# Patient Record
Sex: Female | Born: 1965 | Race: White | Hispanic: No | Smoking: Never smoker
Health system: Southern US, Community
[De-identification: ages and names within clinical notes are randomized; demographics above are authoritative.]

## PROBLEM LIST (undated history)

## (undated) DIAGNOSIS — E538 Deficiency of other specified B group vitamins: Secondary | ICD-10-CM

## (undated) DIAGNOSIS — E119 Type 2 diabetes mellitus without complications: Secondary | ICD-10-CM

## (undated) DIAGNOSIS — T7840XA Allergy, unspecified, initial encounter: Secondary | ICD-10-CM

## (undated) DIAGNOSIS — I10 Essential (primary) hypertension: Secondary | ICD-10-CM

## (undated) DIAGNOSIS — G4730 Sleep apnea, unspecified: Secondary | ICD-10-CM

## (undated) DIAGNOSIS — D649 Anemia, unspecified: Secondary | ICD-10-CM

## (undated) DIAGNOSIS — F4310 Post-traumatic stress disorder, unspecified: Secondary | ICD-10-CM

## (undated) DIAGNOSIS — E559 Vitamin D deficiency, unspecified: Secondary | ICD-10-CM

## (undated) DIAGNOSIS — K219 Gastro-esophageal reflux disease without esophagitis: Secondary | ICD-10-CM

## (undated) DIAGNOSIS — M1990 Unspecified osteoarthritis, unspecified site: Secondary | ICD-10-CM

## (undated) DIAGNOSIS — Z91018 Allergy to other foods: Secondary | ICD-10-CM

## (undated) DIAGNOSIS — K829 Disease of gallbladder, unspecified: Secondary | ICD-10-CM

## (undated) DIAGNOSIS — T8859XA Other complications of anesthesia, initial encounter: Secondary | ICD-10-CM

## (undated) DIAGNOSIS — K900 Celiac disease: Secondary | ICD-10-CM

## (undated) DIAGNOSIS — F419 Anxiety disorder, unspecified: Secondary | ICD-10-CM

## (undated) DIAGNOSIS — K5900 Constipation, unspecified: Secondary | ICD-10-CM

## (undated) DIAGNOSIS — R5383 Other fatigue: Secondary | ICD-10-CM

## (undated) DIAGNOSIS — F32A Depression, unspecified: Secondary | ICD-10-CM

## (undated) DIAGNOSIS — M170 Bilateral primary osteoarthritis of knee: Secondary | ICD-10-CM

## (undated) DIAGNOSIS — F329 Major depressive disorder, single episode, unspecified: Secondary | ICD-10-CM

## (undated) HISTORY — DX: Sleep apnea, unspecified: G47.30

## (undated) HISTORY — DX: Constipation, unspecified: K59.00

## (undated) HISTORY — DX: Unspecified osteoarthritis, unspecified site: M19.90

## (undated) HISTORY — DX: Allergy to other foods: Z91.018

## (undated) HISTORY — DX: Anemia, unspecified: D64.9

## (undated) HISTORY — DX: Gastro-esophageal reflux disease without esophagitis: K21.9

## (undated) HISTORY — DX: Post-traumatic stress disorder, unspecified: F43.10

## (undated) HISTORY — DX: Allergy, unspecified, initial encounter: T78.40XA

## (undated) HISTORY — DX: Vitamin D deficiency, unspecified: E55.9

## (undated) HISTORY — DX: Anxiety disorder, unspecified: F41.9

## (undated) HISTORY — DX: Deficiency of other specified B group vitamins: E53.8

## (undated) HISTORY — DX: Morbid (severe) obesity due to excess calories: E66.01

## (undated) HISTORY — DX: Disease of gallbladder, unspecified: K82.9

## (undated) HISTORY — DX: Celiac disease: K90.0

## (undated) HISTORY — PX: SINUS IRRIGATION: SHX2411

## (undated) HISTORY — DX: Depression, unspecified: F32.A

## (undated) HISTORY — DX: Type 2 diabetes mellitus without complications: E11.9

## (undated) HISTORY — DX: Essential (primary) hypertension: I10

## (undated) HISTORY — DX: Major depressive disorder, single episode, unspecified: F32.9

## (undated) HISTORY — DX: Bilateral primary osteoarthritis of knee: M17.0

## (undated) HISTORY — DX: Other fatigue: R53.83

---

## 2013-10-25 HISTORY — PX: CHOLECYSTECTOMY: SHX55

## 2015-04-23 LAB — HM PAP SMEAR: HM Pap smear: NEGATIVE

## 2017-05-08 LAB — TSH: TSH: 2.31 (ref <=5.90)

## 2017-05-08 LAB — BASIC METABOLIC PANEL
BUN: 16 (ref 4–21)
Creatinine: 0.8 (ref <=1.1)
Glucose: 100
Potassium: 4.1 (ref 3.4–5.3)
Sodium: 138 (ref 137–147)

## 2017-05-08 LAB — CBC AND DIFFERENTIAL
HEMATOCRIT: 42 (ref 36–46)
Hemoglobin: 13.5 (ref 12.0–16.0)
WBC: 7.4

## 2017-05-08 LAB — HEPATIC FUNCTION PANEL
ALT: 14 (ref 7–35)
AST: 14 (ref 13–35)

## 2018-01-26 ENCOUNTER — Encounter: Payer: Self-pay | Admitting: Family Medicine

## 2018-01-26 ENCOUNTER — Other Ambulatory Visit: Payer: Self-pay

## 2018-01-26 ENCOUNTER — Ambulatory Visit (INDEPENDENT_AMBULATORY_CARE_PROVIDER_SITE_OTHER): Payer: 59 | Admitting: Family Medicine

## 2018-01-26 DIAGNOSIS — F329 Major depressive disorder, single episode, unspecified: Secondary | ICD-10-CM | POA: Diagnosis not present

## 2018-01-26 DIAGNOSIS — E559 Vitamin D deficiency, unspecified: Secondary | ICD-10-CM

## 2018-01-26 DIAGNOSIS — F4310 Post-traumatic stress disorder, unspecified: Secondary | ICD-10-CM

## 2018-01-26 DIAGNOSIS — F32A Depression, unspecified: Secondary | ICD-10-CM

## 2018-01-26 DIAGNOSIS — I10 Essential (primary) hypertension: Secondary | ICD-10-CM | POA: Diagnosis not present

## 2018-01-26 DIAGNOSIS — M170 Bilateral primary osteoarthritis of knee: Secondary | ICD-10-CM

## 2018-01-26 DIAGNOSIS — E538 Deficiency of other specified B group vitamins: Secondary | ICD-10-CM | POA: Diagnosis not present

## 2018-01-26 DIAGNOSIS — K900 Celiac disease: Secondary | ICD-10-CM | POA: Insufficient documentation

## 2018-01-26 HISTORY — DX: Celiac disease: K90.0

## 2018-01-26 HISTORY — DX: Depression, unspecified: F32.A

## 2018-01-26 HISTORY — DX: Bilateral primary osteoarthritis of knee: M17.0

## 2018-01-26 HISTORY — DX: Morbid (severe) obesity due to excess calories: E66.01

## 2018-01-26 LAB — CBC WITH DIFFERENTIAL/PLATELET
BASOS ABS: 0.1 10*3/uL (ref 0.0–0.1)
BASOS PCT: 1.2 % (ref 0.0–3.0)
Eosinophils Absolute: 0.4 10*3/uL (ref 0.0–0.7)
Eosinophils Relative: 3.8 % (ref 0.0–5.0)
HEMATOCRIT: 38.7 % (ref 36.0–46.0)
Hemoglobin: 13.1 g/dL (ref 12.0–15.0)
LYMPHS ABS: 2.3 10*3/uL (ref 0.7–4.0)
Lymphocytes Relative: 25.2 % (ref 12.0–46.0)
MCHC: 33.9 g/dL (ref 30.0–36.0)
MCV: 83.4 fl (ref 78.0–100.0)
MONOS PCT: 9.2 % (ref 3.0–12.0)
Monocytes Absolute: 0.9 10*3/uL (ref 0.1–1.0)
NEUTROS ABS: 5.6 10*3/uL (ref 1.4–7.7)
NEUTROS PCT: 60.6 % (ref 43.0–77.0)
PLATELETS: 470 10*3/uL — AB (ref 150.0–400.0)
RBC: 4.65 Mil/uL (ref 3.87–5.11)
RDW: 13.9 % (ref 11.5–15.5)
WBC: 9.3 10*3/uL (ref 4.0–10.5)

## 2018-01-26 LAB — COMPREHENSIVE METABOLIC PANEL
ALT: 28 U/L (ref 0–35)
AST: 23 U/L (ref 0–37)
Albumin: 3.9 g/dL (ref 3.5–5.2)
Alkaline Phosphatase: 106 U/L (ref 39–117)
BUN: 14 mg/dL (ref 6–23)
CHLORIDE: 102 meq/L (ref 96–112)
CO2: 28 mEq/L (ref 19–32)
CREATININE: 0.6 mg/dL (ref 0.40–1.20)
Calcium: 9.1 mg/dL (ref 8.4–10.5)
GFR: 111.03 mL/min (ref >60.00)
GLUCOSE: 93 mg/dL (ref 70–99)
Potassium: 4.3 mEq/L (ref 3.5–5.1)
Sodium: 137 mEq/L (ref 135–145)
TOTAL PROTEIN: 6.8 g/dL (ref 6.0–8.3)
Total Bilirubin: 0.6 mg/dL (ref 0.2–1.2)

## 2018-01-26 LAB — TSH: TSH: 2.23 u[IU]/mL (ref 0.35–4.50)

## 2018-01-26 LAB — B12 AND FOLATE PANEL: Folate: 15.8 ng/mL (ref >5.9)

## 2018-01-26 LAB — VITAMIN D 25 HYDROXY (VIT D DEFICIENCY, FRACTURES): VITD: 32.54 ng/mL (ref 30.00–100.00)

## 2018-01-26 NOTE — Patient Instructions (Addendum)
Please return in 6 weeks for recheck and result review. Please bring in log of your blood pressures outside of the office.   Work on a low salt diet.   It was a pleasure meeting you today! Thank you for choosing [place] to meet your healthcare needs! I truly look forward to working with you. If you have any questions or concerns, please send me a message via Mychart or call the office at [phone].

## 2018-01-26 NOTE — Progress Notes (Signed)
Subjective  CC:  Chief Complaint  Patient presents with  . Establish Care    Moved here from [place], [place]     HPI: Kelly Perez is a 52 y.o. female who presents to [place] [facility] today to establish care with me as a new patient.   She has the following concerns or needs:  Celiac disease - mildly active; very sensitive. Eats a strict diet. Worried about vitamin deficiencies; was on vit B12 injections, Vit D and iron. dxd by bx in 2011.   Obesity: had lost about 80 pounds in the last few years; now back up about 20. Ready to work on it again. Had used phentermine.   H/o HTN but off meds x 1-2 years since losing weight. Not routinely checking blood pressures but had been normal. Ros + occ headache. No cp or sob or le edema. No h/o CAD, DM or hyperlipidemia  PTSD / depression fairly stable on zoloft 50 for several years. [place]; liking it but admits to some anxiety sxs.  Depression screen PHQ 2/9 01/26/2018  Decreased Interest 1  Down, Depressed, Hopeless 1  PHQ - 2 Score 2  Altered sleeping 1  Tired, decreased energy 1  Change in appetite 1  Feeling bad or failure about yourself  1  Trouble concentrating 1  Moving slowly or fidgety/restless 0  Suicidal thoughts 0  PHQ-9 Score 7  Difficult doing work/chores Somewhat difficult   GAD 7 : Generalized Anxiety Score 01/26/2018  Nervous, Anxious, on Edge 1  Control/stop worrying 1  Worry too much - different things 1  Trouble relaxing 1  Restless 1  Easily annoyed or irritable 2  Afraid - awful might happen 1  Total GAD 7 Score 8  Anxiety Difficulty Somewhat difficult    HM: last CPE with pap July 2018 in [place].   We updated and reviewed the patient's past history in detail and it is documented below.  Patient Active Problem List   Diagnosis Date Noted  . Morbid obesity ([place]) 01/26/2018    Priority: High  . Celiac disease 01/26/2018    Priority: Medium  .  Essential hypertension 01/26/2018  . Chronic depression 01/26/2018  . Bilateral primary osteoarthritis of knee 01/26/2018  . Vitamin B12 deficiency   . Vitamin D deficiency   . PTSD (post-traumatic stress disorder)    Health Maintenance  Topic Date Due  . Hepatitis C Screening  Mar 23, 1966  . HIV Screening  10/15/1979  . TETANUS/TDAP  10/15/1983  . MAMMOGRAM  03/25/2018  . INFLUENZA VACCINE  05/25/2018  . COLONOSCOPY  10/26/2019  . PAP SMEAR  03/25/2020    There is no immunization history on file for this patient. Current Meds  Medication Sig  . Ferrous Gluconate (IRON 27 PO) Take by mouth.  . Nutritional Supplements (VITAMIN D MAINTENANCE PO) Take 5,000 Units by mouth.  . NUTRITIONAL SUPPLEMENTS PO Take by mouth.  . sertraline (ZOLOFT) 50 MG tablet Take by mouth.  . vitamin B-12 (CYANOCOBALAMIN) 100 MCG tablet Take 100 mcg by mouth daily.    Allergies: Patient is allergic to neosporin [neomycin-bacitracin zn-polymyx]; tetracycline; and other. Past Medical History Patient  has a past medical history of Allergy, Anxiety, Arthritis, Bilateral primary osteoarthritis of knee (01/26/2018), Celiac disease (01/26/2018), Depression, GERD (gastroesophageal reflux disease), Hypertension, Morbid obesity ([place]) (01/26/2018), PTSD (post-traumatic stress disorder), Vitamin B12 deficiency, and Vitamin D deficiency. Past Surgical History Patient  has a past surgical history that  includes Cholecystectomy (2015) and Sinus Irrigation. Family History: Patient family history includes Arthritis in her father; Bipolar disorder in her maternal grandfather, mother, and sister; COPD in her father; Cardiomyopathy in her father; Depression in her daughter and father; Diabetes in her brother and daughter; Early death in her father; Heart disease in her brother and father; Hyperlipidemia in her father and mother; Hypertension in her daughter; Kidney disease in her father; Rectal cancer in her maternal aunt. Social  History:  Patient  reports that she has never smoked. She has never used smokeless tobacco. She reports that she drank alcohol. She reports that she does not use drugs.  Review of Systems: Constitutional: negative for fever or malaise Ophthalmic: negative for photophobia, double vision or loss of vision Cardiovascular: negative for chest pain, dyspnea on exertion, or new LE swelling Respiratory: negative for SOB or persistent cough Gastrointestinal: negative for abdominal pain, change in bowel habits or melena Genitourinary: negative for dysuria or gross hematuria Musculoskeletal: negative for new gait disturbance or muscular weakness Integumentary: negative for new or persistent rashes Neurological: negative for TIA or stroke symptoms Psychiatric: negative for SI or delusions Allergic/Immunologic: negative for hives  Patient Care Team    Relationship Specialty Notifications Start End  Leamon Arnt, MD PCP - General Family Medicine  01/26/18     Objective  Vitals: BP (!) 148/100   Pulse 89   Temp 98.9 F (37.2 C)   Ht 5' 6"  (1.676 m)   Wt 298 lb 12.8 oz (135.5 kg)   BMI 48.23 kg/m  General:  Well developed, well nourished, no acute distress  Psych:  Alert and oriented,mildly flat mood and affect HEENT:  Normocephalic, atraumatic, non-icteric sclera, PERRL, oropharynx is without mass or exudate, supple neck without adenopathy, mass or thyromegaly Cardiovascular:  RRR without gallop, rub or murmur, nondisplaced PMI Respiratory:  Good breath sounds bilaterally, CTAB with normal respiratory effort Gastrointestinal: normal bowel sounds, soft, non-tender, no noted masses. No HSM MSK: no deformities, contusions. Joints are without erythema or swelling Skin:  Warm, no rashes or suspicious lesions noted Neurologic:    Mental status is normal. Gross motor and sensory exams are normal. Normal gait  Assessment  1. Morbid obesity ([place])   2. Celiac disease   3. Essential hypertension     4. Chronic depression   5. Vitamin B12 deficiency   6. Vitamin D deficiency   7. PTSD (post-traumatic stress disorder)   8. Bilateral primary osteoarthritis of knee      Plan   Obseity: to start working on diet again.   Celiac with h/o vitamin Deficiencies: recheck levels and supplement as needed. Continue gluten free diet.   PTSD/mood disorder: continue zoloft. Recheck 6 weeks and adjust up meds at this time if not improving.   OA - prn tylenol for now and weight loss.   HTN - uncontrolled but pt attributing it to stress. Start home bp log. Recheck 6 weeks. Restart meds at that time if remains elevated. Check renal and lytes.   Follow up:  Return in about 6 weeks (around 03/09/2018) for follow up Hypertension, recheck.  Commons side effects, risks, benefits, and alternatives for medications and treatment plan prescribed today were discussed, and the patient expressed understanding of the given instructions. Patient is instructed to call or message via MyChart if he/she has any questions or concerns regarding our treatment plan. No barriers to understanding were identified. We discussed Red Flag symptoms and signs in detail. Patient expressed understanding regarding what  to do in case of urgent or emergency type symptoms.   Medication list was reconciled, printed and provided to the patient in AVS. Patient instructions and summary information was reviewed with the patient as documented in the AVS. This note was prepared with assistance of Dragon voice recognition software. Occasional wrong-word or sound-a-like substitutions may have occurred due to the inherent limitations of voice recognition software  Orders Placed This Encounter  Procedures  . VITAMIN D 25 Hydroxy (Vit-D Deficiency, Fractures)  . Comprehensive metabolic panel  . B12 and Folate Panel  . CBC with Differential/Platelet  . TSH  . Hepatitis C antibody  . Iron, TIBC and Ferritin Panel   No orders of the defined  types were placed in this encounter.

## 2018-01-27 LAB — HEPATITIS C ANTIBODY
Hepatitis C Ab: NONREACTIVE
SIGNAL TO CUT-OFF: 0.02 (ref <1.00)

## 2018-01-27 LAB — IRON,TIBC AND FERRITIN PANEL
%SAT: 21 % (calc) (ref 11–50)
FERRITIN: 35 ng/mL (ref 10–232)
Iron: 82 ug/dL (ref 45–160)
TIBC: 391 mcg/dL (calc) (ref 250–450)

## 2018-02-23 ENCOUNTER — Encounter: Payer: Self-pay | Admitting: Family Medicine

## 2018-02-27 ENCOUNTER — Encounter: Payer: Self-pay | Admitting: Family Medicine

## 2018-02-27 ENCOUNTER — Ambulatory Visit (INDEPENDENT_AMBULATORY_CARE_PROVIDER_SITE_OTHER): Payer: 59 | Admitting: Family Medicine

## 2018-02-27 ENCOUNTER — Other Ambulatory Visit: Payer: Self-pay

## 2018-02-27 VITALS — BP 146/88 | HR 74 | Temp 98.3°F | Resp 16 | Ht 66.0 in | Wt 306.4 lb

## 2018-02-27 DIAGNOSIS — F329 Major depressive disorder, single episode, unspecified: Secondary | ICD-10-CM

## 2018-02-27 DIAGNOSIS — L237 Allergic contact dermatitis due to plants, except food: Secondary | ICD-10-CM | POA: Diagnosis not present

## 2018-02-27 DIAGNOSIS — I10 Essential (primary) hypertension: Secondary | ICD-10-CM

## 2018-02-27 DIAGNOSIS — Z1231 Encounter for screening mammogram for malignant neoplasm of breast: Secondary | ICD-10-CM | POA: Diagnosis not present

## 2018-02-27 DIAGNOSIS — Z1239 Encounter for other screening for malignant neoplasm of breast: Secondary | ICD-10-CM

## 2018-02-27 DIAGNOSIS — F32A Depression, unspecified: Secondary | ICD-10-CM

## 2018-02-27 MED ORDER — BISOPROLOL FUMARATE 10 MG PO TABS: 10.0000 mg | ORAL_TABLET | Freq: Every day | ORAL | 3 refills | Status: DC

## 2018-02-27 MED ORDER — BETAMETHASONE DIPROPIONATE 0.05 % EX CREA: TOPICAL_CREAM | Freq: Two times a day (BID) | CUTANEOUS | 0 refills | Status: DC

## 2018-02-27 NOTE — Patient Instructions (Signed)
Please return in 3 months for your annual complete physical; please come fasting.  We will call you with information regarding your referral appointment. mammogram  Use the cream for 1-2 weeks and let me know if the rash does not resolve.   If you have any questions or concerns, please don't hesitate to send me a message via MyChart or call the office at [phone]. Thank you for visiting with [place] today! It's our pleasure caring for you.   Hypertension Hypertension, commonly called high blood pressure, is when the force of blood pumping through the arteries is too strong. The arteries are the blood vessels that carry blood from the heart throughout the body. Hypertension forces the heart to work harder to pump blood and may cause arteries to become narrow or stiff. Having untreated or uncontrolled hypertension can cause heart attacks, strokes, kidney disease, and other problems. A blood pressure reading consists of a higher number over a lower number. Ideally, your blood pressure should be below 120/80. The first ("top") number is called the systolic pressure. It is a measure of the pressure in your arteries as your heart beats. The second ("bottom") number is called the diastolic pressure. It is a measure of the pressure in your arteries as the heart relaxes. What are the causes? The cause of this condition is not known. What increases the risk? Some risk factors for high blood pressure are under your control. Others are not. Factors you can change  Smoking.  Having type 2 diabetes mellitus, high cholesterol, or both.  Not getting enough exercise or physical activity.  Being overweight.  Having too much fat, sugar, calories, or salt (sodium) in your diet.  Drinking too much alcohol. Factors that are difficult or impossible to change  Having chronic kidney disease.  Having a family history of high blood pressure.  Age. Risk increases with age.  Race. You may be at higher risk if  you are African-American.  Gender. Men are at higher risk than women before age 11. After age 55, women are at higher risk than men.  Having obstructive sleep apnea.  Stress. What are the signs or symptoms? Extremely high blood pressure (hypertensive crisis) may cause:  Headache.  Anxiety.  Shortness of breath.  Nosebleed.  Nausea and vomiting.  Severe chest pain.  Jerky movements you cannot control (seizures).  How is this diagnosed? This condition is diagnosed by measuring your blood pressure while you are seated, with your arm resting on a surface. The cuff of the blood pressure monitor will be placed directly against the skin of your upper arm at the level of your heart. It should be measured at least twice using the same arm. Certain conditions can cause a difference in blood pressure between your right and left arms. Certain factors can cause blood pressure readings to be lower or higher than normal (elevated) for a short period of time:  When your blood pressure is higher when you are in a health care provider's office than when you are at home, this is called white coat hypertension. Most people with this condition do not need medicines.  When your blood pressure is higher at home than when you are in a health care provider's office, this is called masked hypertension. Most people with this condition may need medicines to control blood pressure.  If you have a high blood pressure reading during one visit or you have normal blood pressure with other risk factors:  You may be asked to return  on a different day to have your blood pressure checked again.  You may be asked to monitor your blood pressure at home for 1 week or longer.  If you are diagnosed with hypertension, you may have other blood or imaging tests to help your health care provider understand your overall risk for other conditions. How is this treated? This condition is treated by making healthy lifestyle  changes, such as eating healthy foods, exercising more, and reducing your alcohol intake. Your health care provider may prescribe medicine if lifestyle changes are not enough to get your blood pressure under control, and if:  Your systolic blood pressure is above 130.  Your diastolic blood pressure is above 80.  Your personal target blood pressure may vary depending on your medical conditions, your age, and other factors. Follow these instructions at home: Eating and drinking  Eat a diet that is high in fiber and potassium, and low in sodium, added sugar, and fat. An example eating plan is called the DASH (Dietary Approaches to Stop Hypertension) diet. To eat this way: ? Eat plenty of fresh fruits and vegetables. Try to fill half of your plate at each meal with fruits and vegetables. ? Eat whole grains, such as whole wheat pasta, brown rice, or whole grain bread. Fill about one quarter of your plate with whole grains. ? Eat or drink low-fat dairy products, such as skim milk or low-fat yogurt. ? Avoid fatty cuts of meat, processed or cured meats, and poultry with skin. Fill about one quarter of your plate with lean proteins, such as fish, chicken without skin, beans, eggs, and tofu. ? Avoid premade and processed foods. These tend to be higher in sodium, added sugar, and fat.  Reduce your daily sodium intake. Most people with hypertension should eat less than 1,500 mg of sodium a day.  Limit alcohol intake to no more than 1 drink a day for nonpregnant women and 2 drinks a day for men. One drink equals 12 oz of beer, 5 oz of wine, or 1 oz of hard liquor. Lifestyle  Work with your health care provider to maintain a healthy body weight or to lose weight. Ask what an ideal weight is for you.  Get at least 30 minutes of exercise that causes your heart to beat faster (aerobic exercise) most days of the week. Activities may include walking, swimming, or biking.  Include exercise to strengthen your  muscles (resistance exercise), such as pilates or lifting weights, as part of your weekly exercise routine. Try to do these types of exercises for 30 minutes at least 3 days a week.  Do not use any products that contain nicotine or tobacco, such as cigarettes and e-cigarettes. If you need help quitting, ask your health care provider.  Monitor your blood pressure at home as told by your health care provider.  Keep all follow-up visits as told by your health care provider. This is important. Medicines  Take over-the-counter and prescription medicines only as told by your health care provider. Follow directions carefully. Blood pressure medicines must be taken as prescribed.  Do not skip doses of blood pressure medicine. Doing this puts you at risk for problems and can make the medicine less effective.  Ask your health care provider about side effects or reactions to medicines that you should watch for. Contact a health care provider if:  You think you are having a reaction to a medicine you are taking.  You have headaches that keep coming back (recurring).  You feel dizzy.  You have swelling in your ankles.  You have trouble with your vision. Get help right away if:  You develop a severe headache or confusion.  You have unusual weakness or numbness.  You feel faint.  You have severe pain in your chest or abdomen.  You vomit repeatedly.  You have trouble breathing. Summary  Hypertension is when the force of blood pumping through your arteries is too strong. If this condition is not controlled, it may put you at risk for serious complications.  Your personal target blood pressure may vary depending on your medical conditions, your age, and other factors. For most people, a normal blood pressure is less than 120/80.  Hypertension is treated with lifestyle changes, medicines, or a combination of both. Lifestyle changes include weight loss, eating a healthy, low-sodium diet,  exercising more, and limiting alcohol. This information is not intended to replace advice given to you by your health care provider. Make sure you discuss any questions you have with your health care provider. Document Released: 10/11/2005 Document Revised: 09/08/2016 Document Reviewed: 09/08/2016 Elsevier Interactive Patient Education  [employer].

## 2018-02-27 NOTE — Progress Notes (Signed)
Subjective  CC:  Chief Complaint  Patient presents with  . Hypertension    BP still high, 149/85 this morning  . Rash    rash on neck and chest  . Depression    HPI: Kelly Perez is a 52 y.o. female who presents to the office today to address the problems listed above in the chief complaint.  Hypertension f/u: Control is poor. Pt reports she is doing well. home BP monitoring in range of 329J systolic over 18A diastolic, no TIAs, no chest pain on exertion, no dyspnea on exertion, noting swelling of ankles, no orthopnea or paroxysmal nocturnal dyspnea. Has been on maxzide and bisoprolol in past. Prefers to restart bb. She denies adverse effects from his BP medications. Compliance with medication is good.   Rash x 2 days on upper chest and lower neck. Has been outside all weekend gardening. Red bumps that itch. Not spreading. No blisters or hives. No systemic sxs.  Mood f/u: on zoloft; now doing well again. Was stressed at last visit due to move/transition but now feeling more stable again. No panic or low mood sxs.   HM: mammogram due in June. Reports tdap 3 years ago (unvalidated). Due for cpe.   BP Readings from Last 3 Encounters:  02/27/18 (!) 146/88  01/26/18 (!) 148/100   Wt Readings from Last 3 Encounters:  02/27/18 (!) 306 lb 6.4 oz (139 kg)  01/26/18 298 lb 12.8 oz (135.5 kg)   Lab Results  Component Value Date   CREATININE 0.60 01/26/2018   BUN 14 01/26/2018   NA 137 01/26/2018   K 4.3 01/26/2018   CL 102 01/26/2018   CO2 28 01/26/2018  I reviewed the patients updated PMH, FH, and SocHx.    Patient Active Problem List   Diagnosis Date Noted  . Morbid obesity ([place]) 01/26/2018    Priority: High  . Essential hypertension 01/26/2018    Priority: High  . Chronic depression 01/26/2018    Priority: High  . Celiac disease 01/26/2018    Priority: Medium  . Bilateral primary osteoarthritis of knee 01/26/2018    Priority: Medium  . Vitamin B12 deficiency    Priority: Medium  . Vitamin D deficiency     Priority: Medium  . PTSD (post-traumatic stress disorder)     Priority: Medium    Allergies: Neosporin [neomycin-bacitracin zn-polymyx]; Tetracycline; and Other  Social History: Patient  reports that she has never smoked. She has never used smokeless tobacco. She reports that she drank alcohol. She reports that she does not use drugs.  Current Meds  Medication Sig  . Ferrous Gluconate (IRON 27 PO) Take by mouth.  . Nutritional Supplements (VITAMIN D MAINTENANCE PO) Take 5,000 Units by mouth.  . NUTRITIONAL SUPPLEMENTS PO Take by mouth. Turmeric  . sertraline (ZOLOFT) 50 MG tablet Take by mouth.  . vitamin B-12 (CYANOCOBALAMIN) 100 MCG tablet Take 100 mcg by mouth daily.    Review of Systems: Cardiovascular: negative for chest pain, palpitations, leg swelling, orthopnea Respiratory: negative for SOB, wheezing or persistent cough Gastrointestinal: negative for abdominal pain Genitourinary: negative for dysuria or gross hematuria  Objective  Vitals: BP (!) 146/88   Pulse 74   Temp 98.3 F (36.8 C) (Oral)   Resp 16   Ht 5' 6"  (1.676 m)   Wt (!) 306 lb 6.4 oz (139 kg)   LMP 02/13/2018   SpO2 99%   BMI 49.45 kg/m  General: no acute distress  Psych:  Alert and oriented, normal  mood and affect HEENT:  Normocephalic, atraumatic, supple neck  Cardiovascular:  RRR without murmur. no edema Respiratory:  Good breath sounds bilaterally, CTAB with normal respiratory effort Skin:  Warm, maculopapular rash on upper chest, no hives or significant vesicles.  Neurologic:   Mental status is normal  Assessment  1. Essential hypertension   2. Chronic depression   3. Breast cancer screening   4. Poison ivy      Plan    Hypertension f/u: BP control is poorly controlled. Start medications again and education given. Recheck 3 months  Hyperlipidemia f/u: will check fasting lipids at cpe in 3 months.   Set up mammogram.   Treat with  topical steroids/ f/u if needed. (avoid oral pred due to side effects) Education regarding management of these chronic disease states was given. Management strategies discussed on successive visits include dietary and exercise recommendations, goals of achieving and maintaining IBW, and lifestyle modifications aiming for adequate sleep and minimizing stressors.   Follow up: Return in about 3 months (around 05/30/2018) for complete physical, follow up Hypertension.   Commons side effects, risks, benefits, and alternatives for medications and treatment plan prescribed today were discussed, and the patient expressed understanding of the given instructions. Patient is instructed to call or message via MyChart if he/she has any questions or concerns regarding our treatment plan. No barriers to understanding were identified. We discussed Red Flag symptoms and signs in detail. Patient expressed understanding regarding what to do in case of urgent or emergency type symptoms.   Medication list was reconciled, printed and provided to the patient in AVS. Patient instructions and summary information was reviewed with the patient as documented in the AVS. This note was prepared with assistance of Dragon voice recognition software. Occasional wrong-word or sound-a-like substitutions may have occurred due to the inherent limitations of voice recognition software  Orders Placed This Encounter  Procedures  . MM DIGITAL SCREENING BILATERAL   Meds ordered this encounter  Medications  . bisoprolol (ZEBETA) 10 MG tablet    Sig: Take 1 tablet (10 mg total) by mouth daily.    Dispense:  90 tablet    Refill:  3  . betamethasone dipropionate (DIPROLENE) 0.05 % cream    Sig: Apply topically 2 (two) times daily.    Dispense:  30 g    Refill:  0

## 2018-03-03 LAB — HM MAMMOGRAPHY

## 2018-03-06 ENCOUNTER — Encounter: Payer: Self-pay | Admitting: Emergency Medicine

## 2018-03-07 ENCOUNTER — Encounter: Payer: Self-pay | Admitting: Emergency Medicine

## 2018-03-07 NOTE — Progress Notes (Signed)
abnl mammo; ultrasound f/u recommended. 60m oval mass right; indeterminate

## 2018-03-16 ENCOUNTER — Encounter: Payer: Self-pay | Admitting: Family Medicine

## 2018-03-17 ENCOUNTER — Encounter: Payer: Self-pay | Admitting: *Deleted

## 2018-06-15 ENCOUNTER — Encounter: Payer: 59 | Admitting: Family Medicine

## 2018-06-22 ENCOUNTER — Ambulatory Visit (INDEPENDENT_AMBULATORY_CARE_PROVIDER_SITE_OTHER): Payer: 59 | Admitting: Family Medicine

## 2018-06-22 ENCOUNTER — Encounter: Payer: Self-pay | Admitting: Family Medicine

## 2018-06-22 VITALS — BP 112/74 | HR 69 | Temp 98.5°F | Ht 66.0 in | Wt 330.8 lb

## 2018-06-22 DIAGNOSIS — Z0000 Encounter for general adult medical examination without abnormal findings: Secondary | ICD-10-CM

## 2018-06-22 DIAGNOSIS — F329 Major depressive disorder, single episode, unspecified: Secondary | ICD-10-CM

## 2018-06-22 DIAGNOSIS — K900 Celiac disease: Secondary | ICD-10-CM | POA: Diagnosis not present

## 2018-06-22 DIAGNOSIS — Z23 Encounter for immunization: Secondary | ICD-10-CM | POA: Diagnosis not present

## 2018-06-22 DIAGNOSIS — F32A Depression, unspecified: Secondary | ICD-10-CM

## 2018-06-22 DIAGNOSIS — I10 Essential (primary) hypertension: Secondary | ICD-10-CM

## 2018-06-22 LAB — LIPID PANEL
CHOLESTEROL: 211 mg/dL — AB (ref 0–200)
HDL: 54 mg/dL (ref >39.00)
LDL CALC: 137 mg/dL — AB (ref 0–99)
NonHDL: 157.28
TRIGLYCERIDES: 99 mg/dL (ref 0.0–149.0)
Total CHOL/HDL Ratio: 4
VLDL: 19.8 mg/dL (ref 0.0–40.0)

## 2018-06-22 MED ORDER — BISOPROLOL FUMARATE 10 MG PO TABS: 10.0000 mg | ORAL_TABLET | Freq: Every day | ORAL | 3 refills | Status: DC
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
## 2018-06-22 NOTE — Patient Instructions (Signed)
Please return in 6 months for follow up of your hypertension.  Your blood pressure now looks well controlled.  Monitor your mood off the zoloft and let me know if you get into trouble.   If you have any questions or concerns, please don't hesitate to send me a message via MyChart or call the office at [phone]. Thank you for visiting with [place] today! It's our pleasure caring for you.  Please do these things to maintain good health!   Exercise at least 30-45 minutes a day,  4-5 days a week.   Eat a low-fat diet with lots of fruits and vegetables, up to 7-9 servings per day.  Drink plenty of water daily. Try to drink 8 8oz glasses per day.  Seatbelts can save your life. Always wear your seatbelt.  Place Smoke Detectors on every level of your home and check batteries every year.  Schedule an appointment with an eye doctor for an eye exam every 1-2 years  Safe sex - use condoms to protect yourself from STDs if you could be exposed to these types of infections. Use birth control if you do not want to become pregnant and are sexually active.  Avoid heavy alcohol use. If you drink, keep it to less than 2 drinks/day and not every day.  [place].  Choose someone you trust that could speak for you if you became unable to speak for yourself.  Depression is common in our stressful world.If you're feeling down or losing interest in things you normally enjoy, please come in for a visit.  If anyone is threatening or hurting you, please get help. Physical or Emotional Violence is never OK.

## 2019-04-10 LAB — HM MAMMOGRAPHY

## 2019-04-11 ENCOUNTER — Encounter: Payer: Self-pay | Admitting: Family Medicine

## 2019-07-09 ENCOUNTER — Other Ambulatory Visit: Payer: Self-pay | Admitting: Family Medicine

## 2019-10-07 ENCOUNTER — Other Ambulatory Visit: Payer: Self-pay | Admitting: Family Medicine

## 2019-10-22 ENCOUNTER — Encounter: Payer: Self-pay | Admitting: Family Medicine

## 2019-10-22 ENCOUNTER — Other Ambulatory Visit: Payer: Self-pay

## 2019-10-22 DIAGNOSIS — G4730 Sleep apnea, unspecified: Secondary | ICD-10-CM

## 2019-10-22 NOTE — Telephone Encounter (Signed)
You can send in an order to [facility], and/or call to see if a generic order will do. IF she needs specifics, then we will need to refer to pulmonology.  Thanks.

## 2019-10-30 ENCOUNTER — Encounter: Payer: Self-pay | Admitting: Family Medicine

## 2019-10-31 ENCOUNTER — Telehealth: Payer: Self-pay | Admitting: Family Medicine

## 2019-10-31 ENCOUNTER — Other Ambulatory Visit: Payer: Self-pay

## 2019-10-31 DIAGNOSIS — G4730 Sleep apnea, unspecified: Secondary | ICD-10-CM

## 2019-10-31 NOTE — Telephone Encounter (Signed)
[facility] is calling saying they received a fax for CPAP supplies but when she contacted the patient, the patient told them she needs an actual CPAP machine. They asked if someone could call them to discuss this further.

## 2019-10-31 NOTE — Telephone Encounter (Signed)
Please advise 

## 2019-11-01 NOTE — Telephone Encounter (Signed)
Updated information faxed to [facility] location.

## 2019-11-13 ENCOUNTER — Encounter: Payer: Self-pay | Admitting: Family Medicine

## 2019-11-14 ENCOUNTER — Other Ambulatory Visit: Payer: Self-pay

## 2019-11-14 DIAGNOSIS — G4730 Sleep apnea, unspecified: Secondary | ICD-10-CM

## 2020-04-23 LAB — HM MAMMOGRAPHY

## 2020-05-21 ENCOUNTER — Encounter: Payer: Self-pay | Admitting: Family Medicine

## 2020-06-11 ENCOUNTER — Encounter (INDEPENDENT_AMBULATORY_CARE_PROVIDER_SITE_OTHER): Payer: Self-pay

## 2020-06-17 ENCOUNTER — Encounter (INDEPENDENT_AMBULATORY_CARE_PROVIDER_SITE_OTHER): Payer: Self-pay | Admitting: Family Medicine

## 2020-06-17 ENCOUNTER — Ambulatory Visit (INDEPENDENT_AMBULATORY_CARE_PROVIDER_SITE_OTHER): Payer: 59 | Admitting: Family Medicine

## 2020-06-17 ENCOUNTER — Other Ambulatory Visit: Payer: Self-pay

## 2020-06-17 VITALS — BP 139/70 | HR 71 | Temp 98.3°F | Ht 66.0 in | Wt 372.0 lb

## 2020-06-17 DIAGNOSIS — R0602 Shortness of breath: Secondary | ICD-10-CM

## 2020-06-17 DIAGNOSIS — I10 Essential (primary) hypertension: Secondary | ICD-10-CM

## 2020-06-17 DIAGNOSIS — Z0289 Encounter for other administrative examinations: Secondary | ICD-10-CM

## 2020-06-17 DIAGNOSIS — R5383 Other fatigue: Secondary | ICD-10-CM | POA: Diagnosis not present

## 2020-06-17 DIAGNOSIS — Z6841 Body Mass Index (BMI) 40.0 and over, adult: Secondary | ICD-10-CM

## 2020-06-17 DIAGNOSIS — Z1331 Encounter for screening for depression: Secondary | ICD-10-CM

## 2020-06-17 DIAGNOSIS — E538 Deficiency of other specified B group vitamins: Secondary | ICD-10-CM

## 2020-06-17 DIAGNOSIS — Z9189 Other specified personal risk factors, not elsewhere classified: Secondary | ICD-10-CM

## 2020-06-17 DIAGNOSIS — D509 Iron deficiency anemia, unspecified: Secondary | ICD-10-CM

## 2020-06-17 DIAGNOSIS — E559 Vitamin D deficiency, unspecified: Secondary | ICD-10-CM | POA: Diagnosis not present

## 2020-06-17 DIAGNOSIS — K900 Celiac disease: Secondary | ICD-10-CM

## 2020-06-17 NOTE — Progress Notes (Signed)
Chief Complaint:   OBESITY Kelly Perez (MR# [id number]) is a 54 y.o. female who presents for evaluation and treatment of obesity and related comorbidities. Current BMI is Body mass index is 60.04 kg/m. Niyonna has been struggling with her weight for many years and has been unsuccessful in either losing weight, maintaining weight loss, or reaching her healthy weight goal.  Cataleah is currently in the action stage of change and ready to dedicate time achieving and maintaining a healthier weight. Analya is interested in becoming our patient and working on intensive lifestyle modifications including (but not limited to) diet and exercise for weight loss.  Berdella's habits were reviewed today and are as follows: her desired weight loss is 142-152 lbs, she started gaining weight in 2019 around the time of her last period, her heaviest weight ever was 328 pounds, she is a picky eater and doesn't like to eat healthier foods, she has significant food cravings issues, she frequently makes poor food choices, she frequently eats larger portions than normal, she has binge eating behaviors and she struggles with emotional eating.  Depression Screen Noelia's Food and Mood (modified PHQ-9) score was 16.  Depression screen [facility] 2/9 06/17/2020  Decreased Interest 3  Down, Depressed, Hopeless 2  PHQ - 2 Score 5  Altered sleeping 3  Tired, decreased energy 3  Change in appetite 2  Feeling bad or failure about yourself  1  Trouble concentrating 1  Moving slowly or fidgety/restless 1  Suicidal thoughts 0  PHQ-9 Score 16  Difficult doing work/chores Somewhat difficult   Subjective:   1. Other fatigue Krisann admits to daytime somnolence and denies waking up still tired. Patent has a history of symptoms of daytime fatigue. Coralyn generally gets 8 hours of sleep per night, and states that she has generally restful sleep. Snoring is present. Apneic episodes are present. Epworth Sleepiness Score is 12.  2. SOB  (shortness of breath) on exertion Tema notes increasing shortness of breath with exercising and seems to be worsening over time with weight gain. She notes getting out of breath sooner with activity than she used to. This has not gotten worse recently. Madelin denies shortness of breath at rest or orthopnea.  3. Essential hypertension Tymia's blood pressure borderline elevated today. She is on bisoprolol, and would like to work on improving with diet and weight loss.  4. Vitamin D deficiency Lillybeth is on Vit D OTC 5,000 IU daily. She still notes fatigue.  5. Celiac disease Lakeisha diagnosis by EGD in 2010. She has been trying to eat gluten free foods. She has a history of some malabsorption issues.  6. Vitamin B12 deficiency Devann has a  History of celiac disease. She is on B12 supplementation and she still notes fatigue.  7. Iron deficiency anemia, unspecified iron deficiency anemia type Tiffani has a history of anemia and celiac disease. She notes fatigue.  8. At risk for heart disease Maddalena is at a higher than average risk for cardiovascular disease due to obesity.   Assessment/Plan:   1. Other fatigue Shellie does feel that her weight is causing her energy to be lower than it should be. Fatigue may be related to obesity, depression or many other causes. Labs will be ordered, and in the meanwhile, Sible will focus on self care including making healthy food choices, increasing physical activity and focusing on stress reduction.  - EKG 12-Lead - CBC with Differential/Platelet - Folate - Hemoglobin A1c - Insulin, random - Lipid Panel  With LDL/HDL Ratio - TSH - T4, free - T3  2. SOB (shortness of breath) on exertion Ethel does feel that she gets out of breath more easily that she used to when she exercises. Cloee's shortness of breath appears to be obesity related and exercise induced. She has agreed to work on weight loss and gradually increase exercise to treat her exercise induced  shortness of breath.   3. Essential hypertension [place]i is working on healthy weight loss, diet, and exercise to improve blood pressure control. We will watch for signs of hypotension as she continues her lifestyle modifications. We will check labs today, and will continue to monitor closely.  - Comprehensive metabolic panel  4. Vitamin D deficiency Low Vitamin D level contributes to fatigue and are associated with obesity, breast, and colon cancer. We will check labs today. Jaslyne will continue Vitamin D as is for now. Tamiya will follow-up for routine testing of Vitamin D, at least 2-3 times per year to avoid over-replacement.  - VITAMIN D 25 Hydroxy (Vit-D Deficiency, Fractures)  5. Celiac disease Kanyia is to start a lower carbohydrate diet and will continue to monitor closely. We will check labs today.  - Folate  6. Vitamin B12 deficiency The diagnosis was reviewed with the patient. We will check labs today, and will continue to monitor. Cabria was started on a B12 rich eating plan. Orders and follow up as documented in patient record.  - Vitamin B12  7. Iron deficiency anemia, unspecified iron deficiency anemia type We will check labs today. Freddy will follow up as directed. Orders and follow up as documented in patient record.  8. Depression screening Shemika had a positive depression screening. Depression is commonly associated with obesity and often results in emotional eating behaviors. We will monitor this closely and work on CBT to help improve the non-hunger eating patterns. Referral to Psychology may be required if no improvement is seen as she continues in our clinic.  9. At risk for heart disease Brittne was given approximately 30 minutes of coronary artery disease prevention counseling today. She is 54 y.o. female and has risk factors for heart disease including obesity. We discussed intensive lifestyle modifications today with an emphasis on specific weight loss instructions and  strategies.   Repetitive spaced learning was employed today to elicit superior memory formation and behavioral change.  10. Class 3 severe obesity with serious comorbidity and body mass index (BMI) of 60.0 to 69.9 in adult, unspecified obesity type (HCC) Maleiyah is currently in the action stage of change and her goal is to continue with weight loss efforts. I recommend Jenella begin the structured treatment plan as follows:  She has agreed to following a lower carbohydrate, vegetable and lean protein rich diet plan.  Exercise goals: As is.   Behavioral modification strategies: increasing lean protein intake and increasing water intake.  She was informed of the importance of frequent follow-up visits to maximize her success with intensive lifestyle modifications for her multiple health conditions. She was informed we would discuss her lab results at her next visit unless there is a critical issue that needs to be addressed sooner. Moe agreed to keep her next visit at the agreed upon time to discuss these results.  Objective:   Blood pressure 139/70, pulse 71, temperature 98.3 F (36.8 C), temperature source Oral, height 5' 6"  (1.676 m), weight (!) 372 lb (168.7 kg), last menstrual period 02/13/2018, SpO2 93 %. Body mass index is 60.04 kg/m.  EKG: Normal sinus  rhythm, rate 71 BPM.  Indirect Calorimeter completed today shows a VO2 of 293 and a REE of 2041.  Her calculated basal metabolic rate is 7106 thus her basal metabolic rate is worse than expected.  General: Cooperative, alert, well developed, in no acute distress. HEENT: Conjunctivae and lids unremarkable. Cardiovascular: Regular rhythm.  Lungs: Normal work of breathing. Neurologic: No focal deficits.   Lab Results  Component Value Date   CREATININE 0.60 01/26/2018   BUN 14 01/26/2018   NA 137 01/26/2018   K 4.3 01/26/2018   CL 102 01/26/2018   CO2 28 01/26/2018   Lab Results  Component Value Date   ALT 28 01/26/2018   AST  23 01/26/2018   ALKPHOS 106 01/26/2018   BILITOT 0.6 01/26/2018   No results found for: HGBA1C No results found for: INSULIN Lab Results  Component Value Date   TSH 2.23 01/26/2018   Lab Results  Component Value Date   CHOL 211 (H) 06/22/2018   HDL 54.00 06/22/2018   LDLCALC 137 (H) 06/22/2018   TRIG 99.0 06/22/2018   CHOLHDL 4 06/22/2018   Lab Results  Component Value Date   WBC 9.3 01/26/2018   HGB 13.1 01/26/2018   HCT 38.7 01/26/2018   MCV 83.4 01/26/2018   PLT 470.0 (H) 01/26/2018   Lab Results  Component Value Date   IRON 82 01/26/2018   TIBC 391 01/26/2018   FERRITIN 35 01/26/2018   Attestation Statements:   Reviewed by clinician on day of visit: allergies, medications, problem list, medical history, surgical history, family history, social history, and previous encounter notes.   I, Trixie Dredge, am acting as transcriptionist for Dennard Nip, MD.  I have reviewed the above documentation for accuracy and completeness, and I agree with the above. - Dennard Nip, MD

## 2020-06-18 LAB — T3: T3, Total: 122 ng/dL (ref 71–180)

## 2020-06-18 LAB — COMPREHENSIVE METABOLIC PANEL
ALT: 49 IU/L — ABNORMAL HIGH (ref 0–32)
AST: 28 IU/L (ref 0–40)
Albumin/Globulin Ratio: 1.4 (ref 1.2–2.2)
Albumin: 4.6 g/dL (ref 3.8–4.9)
Alkaline Phosphatase: 128 IU/L — ABNORMAL HIGH (ref 48–121)
BUN/Creatinine Ratio: 30 — ABNORMAL HIGH (ref 9–23)
BUN: 21 mg/dL (ref 6–24)
Bilirubin Total: 0.4 mg/dL (ref 0.0–1.2)
CO2: 21 mmol/L (ref 20–29)
Calcium: 10 mg/dL (ref 8.7–10.2)
Chloride: 100 mmol/L (ref 96–106)
Creatinine, Ser: 0.7 mg/dL (ref 0.57–1.00)
GFR calc Af Amer: 113 mL/min/{1.73_m2} (ref >59)
GFR calc non Af Amer: 98 mL/min/{1.73_m2} (ref >59)
Globulin, Total: 3.2 g/dL (ref 1.5–4.5)
Glucose: 121 mg/dL — ABNORMAL HIGH (ref 65–99)
Potassium: 5.1 mmol/L (ref 3.5–5.2)
Sodium: 138 mmol/L (ref 134–144)
Total Protein: 7.8 g/dL (ref 6.0–8.5)

## 2020-06-18 LAB — CBC WITH DIFFERENTIAL/PLATELET
Basophils Absolute: 0.1 10*3/uL (ref 0.0–0.2)
Basos: 1 %
EOS (ABSOLUTE): 0.4 10*3/uL (ref 0.0–0.4)
Eos: 3 %
Hematocrit: 45.5 % (ref 34.0–46.6)
Hemoglobin: 14.9 g/dL (ref 11.1–15.9)
Immature Grans (Abs): 0.1 10*3/uL (ref 0.0–0.1)
Immature Granulocytes: 1 %
Lymphocytes Absolute: 2.2 10*3/uL (ref 0.7–3.1)
Lymphs: 20 %
MCH: 29 pg (ref 26.6–33.0)
MCHC: 32.7 g/dL (ref 31.5–35.7)
MCV: 89 fL (ref 79–97)
Monocytes Absolute: 0.9 10*3/uL (ref 0.1–0.9)
Monocytes: 8 %
Neutrophils Absolute: 7.3 10*3/uL — ABNORMAL HIGH (ref 1.4–7.0)
Neutrophils: 67 %
Platelets: 451 10*3/uL — ABNORMAL HIGH (ref 150–450)
RBC: 5.14 x10E6/uL (ref 3.77–5.28)
RDW: 13.6 % (ref 11.7–15.4)
WBC: 10.9 10*3/uL — ABNORMAL HIGH (ref 3.4–10.8)

## 2020-06-18 LAB — VITAMIN D 25 HYDROXY (VIT D DEFICIENCY, FRACTURES): Vit D, 25-Hydroxy: 39.5 ng/mL (ref 30.0–100.0)

## 2020-06-18 LAB — HEMOGLOBIN A1C
Est. average glucose Bld gHb Est-mCnc: 137 mg/dL
Hgb A1c MFr Bld: 6.4 % — ABNORMAL HIGH (ref 4.8–5.6)

## 2020-06-18 LAB — INSULIN, RANDOM: INSULIN: 27.4 u[IU]/mL — ABNORMAL HIGH (ref 2.6–24.9)

## 2020-06-18 LAB — FOLATE: Folate: 2.8 ng/mL — ABNORMAL LOW (ref >3.0)

## 2020-06-18 LAB — LIPID PANEL WITH LDL/HDL RATIO
Cholesterol, Total: 232 mg/dL — ABNORMAL HIGH (ref 100–199)
HDL: 46 mg/dL (ref >39)
LDL Chol Calc (NIH): 160 mg/dL — ABNORMAL HIGH (ref 0–99)
LDL/HDL Ratio: 3.5 ratio — ABNORMAL HIGH (ref 0.0–3.2)
Triglycerides: 144 mg/dL (ref 0–149)
VLDL Cholesterol Cal: 26 mg/dL (ref 5–40)

## 2020-06-18 LAB — T4, FREE: Free T4: 1.33 ng/dL (ref 0.82–1.77)

## 2020-06-18 LAB — TSH: TSH: 2.69 u[IU]/mL (ref 0.450–4.500)

## 2020-06-18 LAB — VITAMIN B12: Vitamin B-12: >2000 pg/mL — ABNORMAL HIGH (ref 232–1245)

## 2020-07-01 ENCOUNTER — Other Ambulatory Visit: Payer: Self-pay

## 2020-07-01 ENCOUNTER — Encounter (INDEPENDENT_AMBULATORY_CARE_PROVIDER_SITE_OTHER): Payer: Self-pay | Admitting: Family Medicine

## 2020-07-01 ENCOUNTER — Ambulatory Visit (INDEPENDENT_AMBULATORY_CARE_PROVIDER_SITE_OTHER): Payer: 59 | Admitting: Family Medicine

## 2020-07-01 VITALS — BP 130/79 | HR 80 | Temp 98.3°F | Ht 66.0 in | Wt 366.0 lb

## 2020-07-01 DIAGNOSIS — E538 Deficiency of other specified B group vitamins: Secondary | ICD-10-CM | POA: Diagnosis not present

## 2020-07-01 DIAGNOSIS — Z6841 Body Mass Index (BMI) 40.0 and over, adult: Secondary | ICD-10-CM

## 2020-07-01 DIAGNOSIS — Z9189 Other specified personal risk factors, not elsewhere classified: Secondary | ICD-10-CM | POA: Diagnosis not present

## 2020-07-01 DIAGNOSIS — E7849 Other hyperlipidemia: Secondary | ICD-10-CM | POA: Diagnosis not present

## 2020-07-01 DIAGNOSIS — R7303 Prediabetes: Secondary | ICD-10-CM | POA: Diagnosis not present

## 2020-07-01 DIAGNOSIS — E559 Vitamin D deficiency, unspecified: Secondary | ICD-10-CM

## 2020-07-01 DIAGNOSIS — K760 Fatty (change of) liver, not elsewhere classified: Secondary | ICD-10-CM

## 2020-07-01 MED ORDER — VITAMIN D (ERGOCALCIFEROL) 1.25 MG (50000 UNIT) PO CAPS: 50000.0000 [IU] | ORAL_CAPSULE | ORAL | 0 refills | Status: DC

## 2020-07-02 NOTE — Progress Notes (Signed)
Chief Complaint:   OBESITY Kelly Perez is here to discuss her progress with her obesity treatment plan along with follow-up of her obesity related diagnoses. Kelly Perez is on following a lower carbohydrate, vegetable and lean protein rich diet plan and states she is following her eating plan approximately 99% of the time. Kelly Perez states she is walking the dog for 15 minutes 2 times per week.  Today's visit was #: 2 Starting weight: 372 lbs Starting date: 06/17/2020 Today's weight: 366 lbs Today's date: 07/01/2020 Total lbs lost to date: 6 Total lbs lost since last in-office visit: 6  Interim History: Kelly Perez has done well with weight loss on her Low carbohydrate plan. Her hunger is controlled and she tolerated the decreased carbohydrates well. She has a history of celiac disease so she has already decreased gluten.  Subjective:   1. Mixed hyperlipidemia Kelly Perez has a new diagnosis of mixed hyperlipidemia. She is not on statin, and her cholesterol has increased. She is working on diet and weight loss to improve. I discussed labs with the patient today.  2. Vitamin D deficiency Kelly Perez is on OTC Vit D, and her level is not yet at goal. She notes fatigue. I discussed labs with the patient today.  3. Pre-diabetes Kelly Perez's A1c is worsening and is elevated at 6.4, very close to diabetes mellitus. She has been on metformin in the past, and had significant GI upset. I discussed labs with the patient today.  4. Vitamin B12 deficiency Kelly Perez is n B12, but she is over-replaced. She still notes fatigue so this is unlikely due to B12 deficiency. I discussed labs with the patient today.  5. NAFLD (nonalcoholic fatty liver disease) Kelly Perez's ALT is elevated, and she has a history of non-alcoholic fatty liver disease. She is working on diet and weight loss now. She denies jaundice or severe abdominal pain. I discussed labs with the patient today.  6. Folate deficiency Kelly Perez is not on multivitamins, and is now eating  more folic acid rich foods. She has a history of celiac which may be causing malabsorption. I discussed labs with the patient today.  7. At risk for diabetes mellitus Kelly Perez is at higher than average risk for developing diabetes due to her obesity.   Assessment/Plan:   1. Other hyperlipidemia Cardiovascular risk and specific lipid/LDL goals reviewed. We discussed several lifestyle modifications today. Abrish will continue her low carbohydrate diet, and will continue to work on exercise and weight loss efforts. We will recheck labs in 3 months. Orders and follow up as documented in patient record.   2. Vitamin D deficiency Low Vitamin D level contributes to fatigue and are associated with obesity, breast, and colon cancer. Sander agreed to continue OTC Vit D, and will start prescription Vitamin D 50,000 IU every week with no refills. She will follow-up for routine testing of Vitamin D, at least 2-3 times per year to avoid over-replacement.  - Vitamin D, Ergocalciferol, (DRISDOL) 1.25 MG (50000 UNIT) CAPS capsule; Take 1 capsule (50,000 Units total) by mouth every 7 (seven) days.  Dispense: 4 capsule; Refill: 0  3. Pre-diabetes Kelly Perez will continue her low carbohydrate plan, and will continue to work on weight loss, exercise, and decreasing simple carbohydrates to help decrease the risk of diabetes. We will recheck labs in 3 months.  4. Vitamin B12 deficiency The diagnosis was reviewed with the patient. We will continue to monitor. Kelly Perez is to decrease OTC B12 to every other day. We will recheck labs in 3 months.  Orders and follow up as documented in patient record.  5. NAFLD (nonalcoholic fatty liver disease) We discussed the likely diagnosis of non-alcoholic fatty liver disease today and how this condition is obesity related. Kelly Perez was educated the importance of weight loss. Golden agreed to continue with her weight loss efforts with healthier diet and exercise as an essential part of her treatment  plan. We will recheck labs in 3 months.  6. Folate deficiency Kelly Perez will continue her folic acid rich foods, and we will recheck labs in 3 months.  7. At risk for diabetes mellitus Kelly Perez was given approximately 30 minutes of diabetes education and counseling today. We discussed intensive lifestyle modifications today with an emphasis on weight loss as well as increasing exercise and decreasing simple carbohydrates in her diet. We also reviewed medication options with an emphasis on risk versus benefit of those discussed.   Repetitive spaced learning was employed today to elicit superior memory formation and behavioral change.  8. Class 3 severe obesity with serious comorbidity and body mass index (BMI) of 50.0 to 59.9 in adult, unspecified obesity type (HCC) Kelly Perez is currently in the action stage of change. As such, her goal is to continue with weight loss efforts. She has agreed to following a lower carbohydrate, vegetable and lean protein rich diet plan.   Exercise goals: As is.  Behavioral modification strategies: increasing lean protein intake.  Kelly Perez has agreed to follow-up with our clinic in 2 weeks. She was informed of the importance of frequent follow-up visits to maximize her success with intensive lifestyle modifications for her multiple health conditions.   Objective:   Blood pressure 130/79, pulse 80, temperature 98.3 F (36.8 C), height 5' 6"  (1.676 m), weight (!) 366 lb (166 kg), last menstrual period 02/13/2018, SpO2 95 %. Body mass index is 59.07 kg/m.  General: Cooperative, alert, well developed, in no acute distress. HEENT: Conjunctivae and lids unremarkable. Cardiovascular: Regular rhythm.  Lungs: Normal work of breathing. Neurologic: No focal deficits.   Lab Results  Component Value Date   CREATININE 0.70 06/17/2020   BUN 21 06/17/2020   NA 138 06/17/2020   K 5.1 06/17/2020   CL 100 06/17/2020   CO2 21 06/17/2020   Lab Results  Component Value Date   ALT  49 (H) 06/17/2020   AST 28 06/17/2020   ALKPHOS 128 (H) 06/17/2020   BILITOT 0.4 06/17/2020   Lab Results  Component Value Date   HGBA1C 6.4 (H) 06/17/2020   Lab Results  Component Value Date   INSULIN 27.4 (H) 06/17/2020   Lab Results  Component Value Date   TSH 2.690 06/17/2020   Lab Results  Component Value Date   CHOL 232 (H) 06/17/2020   HDL 46 06/17/2020   LDLCALC 160 (H) 06/17/2020   TRIG 144 06/17/2020   CHOLHDL 4 06/22/2018   Lab Results  Component Value Date   WBC 10.9 (H) 06/17/2020   HGB 14.9 06/17/2020   HCT 45.5 06/17/2020   MCV 89 06/17/2020   PLT 451 (H) 06/17/2020   Lab Results  Component Value Date   IRON 82 01/26/2018   TIBC 391 01/26/2018   FERRITIN 35 01/26/2018   Attestation Statements:   Reviewed by clinician on day of visit: allergies, medications, problem list, medical history, surgical history, family history, social history, and previous encounter notes.   I, Trixie Dredge, am acting as transcriptionist for Dennard Nip, MD.  I have reviewed the above documentation for accuracy and completeness, and I agree  with the above. -  Dennard Nip, MD

## 2020-07-15 ENCOUNTER — Other Ambulatory Visit: Payer: Self-pay

## 2020-07-15 ENCOUNTER — Ambulatory Visit (INDEPENDENT_AMBULATORY_CARE_PROVIDER_SITE_OTHER): Payer: 59 | Admitting: Family Medicine

## 2020-07-15 ENCOUNTER — Encounter (INDEPENDENT_AMBULATORY_CARE_PROVIDER_SITE_OTHER): Payer: Self-pay | Admitting: Family Medicine

## 2020-07-15 VITALS — BP 124/79 | HR 76 | Temp 98.0°F | Ht 66.0 in | Wt 365.0 lb

## 2020-07-15 DIAGNOSIS — Z6841 Body Mass Index (BMI) 40.0 and over, adult: Secondary | ICD-10-CM

## 2020-07-15 DIAGNOSIS — E559 Vitamin D deficiency, unspecified: Secondary | ICD-10-CM | POA: Diagnosis not present

## 2020-07-15 DIAGNOSIS — Z9189 Other specified personal risk factors, not elsewhere classified: Secondary | ICD-10-CM | POA: Diagnosis not present

## 2020-07-15 DIAGNOSIS — R7303 Prediabetes: Secondary | ICD-10-CM | POA: Diagnosis not present

## 2020-07-16 ENCOUNTER — Other Ambulatory Visit (INDEPENDENT_AMBULATORY_CARE_PROVIDER_SITE_OTHER): Payer: Self-pay | Admitting: Family Medicine

## 2020-07-16 DIAGNOSIS — E559 Vitamin D deficiency, unspecified: Secondary | ICD-10-CM

## 2020-07-16 MED ORDER — METFORMIN HCL 500 MG PO TABS: 500.0000 mg | ORAL_TABLET | Freq: Every morning | ORAL | 0 refills | Status: DC

## 2020-07-16 MED ORDER — VITAMIN D (ERGOCALCIFEROL) 1.25 MG (50000 UNIT) PO CAPS: 50000.0000 [IU] | ORAL_CAPSULE | ORAL | 0 refills | Status: DC

## 2020-07-17 NOTE — Progress Notes (Signed)
Chief Complaint:   OBESITY Kelly Perez is here to discuss her progress with her obesity treatment plan along with follow-up of her obesity related diagnoses. Kelly Perez is on following a lower carbohydrate, vegetable and lean protein rich diet plan and states she is following her eating plan approximately 99% of the time. Kelly Perez states she is doingtaichi and walking the dog for 15 minutes 3 times per week.  Today's visit was #: 3 Starting weight: 372 lbs Starting date: 06/17/2020 Today's weight: 365 lbs Today's date: 07/15/2020 Total lbs lost to date: 7 Total lbs lost since last in-office visit: 1  Interim History: Joel continues to do well with weight loss and she is exercising regularly. She hasn't been able to sleep as well while watching her kids dog, but this is done now.  Subjective:   1. Pre-diabetes Kelly Perez has done well with diet, and she has taken metformin in the past but she had GI upset. She would like to try it again.  2. Vitamin D deficiency Kelly Perez is stable on Vit D, but her level is not yet at goal. She denies nausea or vomiting.  3. At risk for impaired metabolic function Kelly Perez is at increased risk for impaired metabolic function due to decreased sleep.  Assessment/Plan:   1. Pre-diabetes Kelly Perez will continue to work on weight loss, exercise, and decreasing simple carbohydrates to help decrease the risk of diabetes. Kelly Perez agreed to start metformin 500 mg q AM with no refills (she is ok to start at 1/2 pill to start).   - metFORMIN (GLUCOPHAGE) 500 MG tablet; Take 1 tablet (500 mg total) by mouth every morning.  Dispense: 30 tablet; Refill: 0  2. Vitamin D deficiency Low Vitamin D level contributes to fatigue and are associated with obesity, breast, and colon cancer. We will refill prescription Vitamin D for 1 month. Kelly Perez will follow-up for routine testing of Vitamin D, at least 2-3 times per year to avoid over-replacement.  - Vitamin D, Ergocalciferol, (DRISDOL) 1.25 MG  (50000 UNIT) CAPS capsule; Take 1 capsule (50,000 Units total) by mouth every 7 (seven) days.  Dispense: 4 capsule; Refill: 0  3. At risk for impaired metabolic function Kelly Perez was given approximately 15 minutes of impaired  metabolic function prevention counseling today. We discussed intensive lifestyle modifications today with an emphasis on specific nutrition and exercise instructions and strategies. Kelly Perez's goal is to sleep 7-8 hours per night.  Repetitive spaced learning was employed today to elicit superior memory formation and behavioral change.  4. Class 3 severe obesity with serious comorbidity and body mass index (BMI) of 50.0 to 59.9 in adult, unspecified obesity type (HCC) Kelly Perez is currently in the action stage of change. As such, her goal is to continue with weight loss efforts. She has agreed to following a lower carbohydrate, vegetable and lean protein rich diet plan.   Exercise goals: As is.  Behavioral modification strategies: increasing lean protein intake, increasing vegetables and increasing water intake.  Ashwika has agreed to follow-up with our clinic in 3 weeks. She was informed of the importance of frequent follow-up visits to maximize her success with intensive lifestyle modifications for her multiple health conditions.   Objective:   Blood pressure 124/79, pulse 76, temperature 98 F (36.7 C), height 5' 6"  (1.676 m), weight (!) 365 lb (165.6 kg), last menstrual period 02/13/2018, SpO2 96 %. Body mass index is 58.91 kg/m.  General: Cooperative, alert, well developed, in no acute distress. HEENT: Conjunctivae and lids unremarkable. Cardiovascular: Regular  rhythm.  Lungs: Normal work of breathing. Neurologic: No focal deficits.   Lab Results  Component Value Date   CREATININE 0.70 06/17/2020   BUN 21 06/17/2020   NA 138 06/17/2020   K 5.1 06/17/2020   CL 100 06/17/2020   CO2 21 06/17/2020   Lab Results  Component Value Date   ALT 49 (H) 06/17/2020   AST 28  06/17/2020   ALKPHOS 128 (H) 06/17/2020   BILITOT 0.4 06/17/2020   Lab Results  Component Value Date   HGBA1C 6.4 (H) 06/17/2020   Lab Results  Component Value Date   INSULIN 27.4 (H) 06/17/2020   Lab Results  Component Value Date   TSH 2.690 06/17/2020   Lab Results  Component Value Date   CHOL 232 (H) 06/17/2020   HDL 46 06/17/2020   LDLCALC 160 (H) 06/17/2020   TRIG 144 06/17/2020   CHOLHDL 4 06/22/2018   Lab Results  Component Value Date   WBC 10.9 (H) 06/17/2020   HGB 14.9 06/17/2020   HCT 45.5 06/17/2020   MCV 89 06/17/2020   PLT 451 (H) 06/17/2020   Lab Results  Component Value Date   IRON 82 01/26/2018   TIBC 391 01/26/2018   FERRITIN 35 01/26/2018   Attestation Statements:   Reviewed by clinician on day of visit: allergies, medications, problem list, medical history, surgical history, family history, social history, and previous encounter notes.   I, Trixie Dredge, am acting as transcriptionist for Dennard Nip, MD.  I have reviewed the above documentation for accuracy and completeness, and I agree with the above. -  Dennard Nip, MD

## 2020-07-22 ENCOUNTER — Other Ambulatory Visit (INDEPENDENT_AMBULATORY_CARE_PROVIDER_SITE_OTHER): Payer: Self-pay | Admitting: Family Medicine

## 2020-07-22 DIAGNOSIS — E559 Vitamin D deficiency, unspecified: Secondary | ICD-10-CM

## 2020-08-05 ENCOUNTER — Ambulatory Visit (INDEPENDENT_AMBULATORY_CARE_PROVIDER_SITE_OTHER): Payer: 59 | Admitting: Family Medicine

## 2020-08-05 ENCOUNTER — Encounter (INDEPENDENT_AMBULATORY_CARE_PROVIDER_SITE_OTHER): Payer: Self-pay | Admitting: Family Medicine

## 2020-08-05 ENCOUNTER — Other Ambulatory Visit: Payer: Self-pay

## 2020-08-05 VITALS — BP 115/72 | HR 70 | Temp 98.0°F | Ht 66.0 in | Wt 356.0 lb

## 2020-08-05 DIAGNOSIS — Z6841 Body Mass Index (BMI) 40.0 and over, adult: Secondary | ICD-10-CM

## 2020-08-05 DIAGNOSIS — R7303 Prediabetes: Secondary | ICD-10-CM | POA: Diagnosis not present

## 2020-08-05 DIAGNOSIS — E559 Vitamin D deficiency, unspecified: Secondary | ICD-10-CM

## 2020-08-05 DIAGNOSIS — Z9189 Other specified personal risk factors, not elsewhere classified: Secondary | ICD-10-CM | POA: Diagnosis not present

## 2020-08-05 MED ORDER — VITAMIN D (ERGOCALCIFEROL) 1.25 MG (50000 UNIT) PO CAPS: 50000.0000 [IU] | ORAL_CAPSULE | ORAL | 0 refills | Status: DC

## 2020-08-05 MED ORDER — METFORMIN HCL 500 MG PO TABS: 500.0000 mg | ORAL_TABLET | Freq: Every morning | ORAL | 0 refills | Status: DC

## 2020-08-06 ENCOUNTER — Other Ambulatory Visit (INDEPENDENT_AMBULATORY_CARE_PROVIDER_SITE_OTHER): Payer: Self-pay | Admitting: Family Medicine

## 2020-08-06 DIAGNOSIS — E559 Vitamin D deficiency, unspecified: Secondary | ICD-10-CM

## 2020-08-07 ENCOUNTER — Other Ambulatory Visit (INDEPENDENT_AMBULATORY_CARE_PROVIDER_SITE_OTHER): Payer: Self-pay | Admitting: Family Medicine

## 2020-08-07 DIAGNOSIS — R7303 Prediabetes: Secondary | ICD-10-CM

## 2020-08-18 NOTE — Progress Notes (Signed)
Chief Complaint:   OBESITY Kelly Perez is here to discuss her progress with her obesity treatment plan along with follow-up of her obesity related diagnoses. Kelly Perez is on following a lower carbohydrate, vegetable and lean protein rich diet plan and states she is following her eating plan approximately 99% of the time. Kelly Perez states she is walking the dog and doing Tai chi for 15-20 minutes 4-7 times per week.  Today's visit was #: 4 Starting weight: 372 lbs Starting date: 06/17/2020 Today's weight: 356 lbs Today's date: 08/05/2020 Total lbs lost to date: 16 Total lbs lost since last in-office visit: 9  Interim History: Kelly Perez has done very well with weight loss. She is exercising regularly and her hunger is controlled. She is working on Metallurgist eating and she feels this is a good plan for her overall.  Subjective:   1. Pre-diabetes Kelly Perez restarted metformin at 1/2 pill with lunch, and then increased to the full dose and she is doing well.  2. Vitamin D deficiency Kelly Perez is on Vit D, but her level is not yet at goal. She denies nausea, vomiting, or muscle weakness.  3. At risk for hypoglycemia Kelly Perez is at increased risk for hypoglycemia due to changes in diet, diagnosis of diabetes, and/or insulin use.  Assessment/Plan:   1. Pre-diabetes Revella will continue to work on weight loss, diet, exercise, and decreasing simple carbohydrates to help decrease the risk of diabetes. She will watch for signs of hypoglycemia. We will refill metformin for 1 month.  - metFORMIN (GLUCOPHAGE) 500 MG tablet; Take 1 tablet (500 mg total) by mouth every morning.  Dispense: 30 tablet; Refill: 0  2. Vitamin D deficiency Low Vitamin D level contributes to fatigue and are associated with obesity, breast, and colon cancer. We will recheck labs in 4-6 weeks, and we will refill prescription Vitamin D for 1 month. Kelly Perez will follow-up for routine testing of Vitamin D, at least 2-3 times per year to  avoid over-replacement.  - Vitamin D, Ergocalciferol, (DRISDOL) 1.25 MG (50000 UNIT) CAPS capsule; Take 1 capsule (50,000 Units total) by mouth every 7 (seven) days.  Dispense: 4 capsule; Refill: 0  3. At risk for hypoglycemia Kelly Perez was given approximately 15 minutes of counseling today regarding prevention of hypoglycemia. She was advised of symptoms of hypoglycemia. Kelly Perez was instructed to avoid skipping meals, eat regular protein rich meals and schedule low calorie snacks as needed.   Repetitive spaced learning was employed today to elicit superior memory formation and behavioral change  4. Class 3 severe obesity with serious comorbidity and body mass index (BMI) of 50.0 to 59.9 in adult, unspecified obesity type (HCC) Aerial is currently in the action stage of change. As such, her goal is to continue with weight loss efforts. She has agreed to following a lower carbohydrate, vegetable and lean protein rich diet plan.   Exercise goals: As is.  Behavioral modification strategies: increasing lean protein intake.  Kelly Perez has agreed to follow-up with our clinic in 3 weeks. She was informed of the importance of frequent follow-up visits to maximize her success with intensive lifestyle modifications for her multiple health conditions.   Objective:   Blood pressure 115/72, pulse 70, temperature 98 F (36.7 C), height 5' 6" (1.676 m), weight (!) 356 lb (161.5 kg), last menstrual period 02/13/2018, SpO2 96 %. Body mass index is 57.46 kg/m.  General: Cooperative, alert, well developed, in no acute distress. HEENT: Conjunctivae and lids unremarkable. Cardiovascular: Regular rhythm.  Lungs:  work of breathing. °Neurologic: No focal deficits.  ° °Lab Results  °Component Value Date  ° CREATININE 0.70 06/17/2020  ° BUN 21 06/17/2020  ° NA 138 06/17/2020  ° K 5.1 06/17/2020  ° CL 100 06/17/2020  ° CO2 21 06/17/2020  ° °Lab Results  °Component Value Date  ° ALT 49 (H) 06/17/2020  ° AST 28  06/17/2020  ° ALKPHOS 128 (H) 06/17/2020  ° BILITOT 0.4 06/17/2020  ° °Lab Results  °Component Value Date  ° HGBA1C 6.4 (H) 06/17/2020  ° °Lab Results  °Component Value Date  ° INSULIN 27.4 (H) 06/17/2020  ° °Lab Results  °Component Value Date  ° TSH 2.690 06/17/2020  ° °Lab Results  °Component Value Date  ° CHOL 232 (H) 06/17/2020  ° HDL 46 06/17/2020  ° LDLCALC 160 (H) 06/17/2020  ° TRIG 144 06/17/2020  ° CHOLHDL 4 06/22/2018  ° °Lab Results  °Component Value Date  ° WBC 10.9 (H) 06/17/2020  ° HGB 14.9 06/17/2020  ° HCT 45.5 06/17/2020  ° MCV 89 06/17/2020  ° PLT 451 (H) 06/17/2020  ° °Lab Results  °Component Value Date  ° IRON 82 01/26/2018  ° TIBC 391 01/26/2018  ° FERRITIN 35 01/26/2018  ° °Attestation Statements:  ° °Reviewed by clinician on day of visit: allergies, medications, problem list, medical history, surgical history, family history, social history, and previous encounter notes. ° ° °I, Sharon Martin, am acting as transcriptionist for Caren Beasley, MD. ° °I have reviewed the above documentation for accuracy and completeness, and I agree with the above. -  Caren Beasley, MD ° ° °

## 2020-08-26 ENCOUNTER — Other Ambulatory Visit: Payer: Self-pay

## 2020-08-26 ENCOUNTER — Ambulatory Visit (INDEPENDENT_AMBULATORY_CARE_PROVIDER_SITE_OTHER): Payer: 59 | Admitting: Family Medicine

## 2020-08-26 ENCOUNTER — Encounter (INDEPENDENT_AMBULATORY_CARE_PROVIDER_SITE_OTHER): Payer: Self-pay | Admitting: Family Medicine

## 2020-08-26 VITALS — BP 129/77 | HR 72 | Temp 98.1°F | Ht 66.0 in | Wt 354.0 lb

## 2020-08-26 DIAGNOSIS — G4733 Obstructive sleep apnea (adult) (pediatric): Secondary | ICD-10-CM | POA: Diagnosis not present

## 2020-08-26 DIAGNOSIS — R7303 Prediabetes: Secondary | ICD-10-CM | POA: Diagnosis not present

## 2020-08-26 DIAGNOSIS — Z6841 Body Mass Index (BMI) 40.0 and over, adult: Secondary | ICD-10-CM

## 2020-08-26 DIAGNOSIS — Z9189 Other specified personal risk factors, not elsewhere classified: Secondary | ICD-10-CM | POA: Diagnosis not present

## 2020-08-26 MED ORDER — METFORMIN HCL 500 MG PO TABS: 500.0000 mg | ORAL_TABLET | Freq: Every morning | ORAL | 0 refills | Status: DC

## 2020-08-26 NOTE — Progress Notes (Signed)
predia

## 2020-08-27 NOTE — Progress Notes (Signed)
Chief Complaint:   OBESITY Kelly Perez is here to discuss her progress with her obesity treatment plan along with follow-up of her obesity related diagnoses. Kelly Perez is on following a lower carbohydrate, vegetable and lean protein rich diet plan and states she is following her eating plan approximately 95% of the time. Kelly Perez states she is walking the dog, Tai chi for 15-30 minutes 3-7 times per week.  Today's visit was #: 5 Starting weight: 372 lbs Starting date: 06/17/2020 Today's weight: 354 lbs Today's date: 08/26/2020 Total lbs lost to date: 18 Total lbs lost since last in-office visit: 2  Interim History: Myracle continues to do well with weight loss. She is exercising most days and doing well with meal planning and prepping. She is losing weight doing the low carbohydrate plan. She has celiac disease which is affecting her choices but she is managing well overall.  Subjective:   1. Pre-diabetes Kelly Perez notes decreased GI upset when she takes it with food, especially at lunch. She is doing very well minimizing simple carbohydrates.  2. OSA (obstructive sleep apnea) Kelly Perez is sleeping approximately 8 hours at night, and she is using CPAP regularly and tolerating it well. She is losing weight at a steady pace and she feels her setting are appropriate for her.  3. At risk for diarrhea Kelly Perez is at risk for diarrhea due to medications  Assessment/Plan:   1. Pre-diabetes Kelly Perez will continue to work on weight loss, diet, exercise, and decreasing simple carbohydrates to help decrease the risk of diabetes. We will refill metformin for 1 month.  - metFORMIN (GLUCOPHAGE) 500 MG tablet; Take 1 tablet (500 mg total) by mouth every morning.  Dispense: 30 tablet; Refill: 0  2. OSA (obstructive sleep apnea) Intensive lifestyle modifications are the first line treatment for this issue. We discussed several lifestyle modifications today. Kelly Perez will continue to work on diet, exercise and weight loss  efforts. We will continue to monitor. Orders and follow up as documented in patient record.   3. At risk for diarrhea Kelly Perez was given approximately 15 minutes of diarrhea prevention counseling today. She is 53 y.o. female and has risk factors for diarrhea including medications and changes in diet. We discussed intensive lifestyle modifications today with an emphasis on specific weight loss instructions including dietary strategies.   Repetitive spaced learning was employed today to elicit superior memory formation and behavioral change.  4. Class 3 severe obesity with serious comorbidity and body mass index (BMI) of 50.0 to 59.9 in adult, unspecified obesity type (HCC) Kelly Perez is currently in the action stage of change. As such, her goal is to continue with weight loss efforts. She has agreed to following a lower carbohydrate, vegetable and lean protein rich diet plan.   Exercise goals: As is.  Behavioral modification strategies: meal planning and cooking strategies and holiday eating strategies .  Kelly Perez has agreed to follow-up with our clinic in 2 to 3 weeks. She was informed of the importance of frequent follow-up visits to maximize her success with intensive lifestyle modifications for her multiple health conditions.   Objective:   Blood pressure 129/77, pulse 72, temperature 98.1 F (36.7 C), height 5' 6"  (1.676 m), weight (!) 354 lb (160.6 kg), last menstrual period 02/13/2018, SpO2 96 %. Body mass index is 57.14 kg/m.  General: Cooperative, alert, well developed, in no acute distress. HEENT: Conjunctivae and lids unremarkable. Cardiovascular: Regular rhythm.  Lungs: Normal work of breathing. Neurologic: No focal deficits.   Lab Results  Component Value Date   CREATININE 0.70 06/17/2020   BUN 21 06/17/2020   NA 138 06/17/2020   K 5.1 06/17/2020   CL 100 06/17/2020   CO2 21 06/17/2020   Lab Results  Component Value Date   ALT 49 (H) 06/17/2020   AST 28 06/17/2020   ALKPHOS  128 (H) 06/17/2020   BILITOT 0.4 06/17/2020   Lab Results  Component Value Date   HGBA1C 6.4 (H) 06/17/2020   Lab Results  Component Value Date   INSULIN 27.4 (H) 06/17/2020   Lab Results  Component Value Date   TSH 2.690 06/17/2020   Lab Results  Component Value Date   CHOL 232 (H) 06/17/2020   HDL 46 06/17/2020   LDLCALC 160 (H) 06/17/2020   TRIG 144 06/17/2020   CHOLHDL 4 06/22/2018   Lab Results  Component Value Date   WBC 10.9 (H) 06/17/2020   HGB 14.9 06/17/2020   HCT 45.5 06/17/2020   MCV 89 06/17/2020   PLT 451 (H) 06/17/2020   Lab Results  Component Value Date   IRON 82 01/26/2018   TIBC 391 01/26/2018   FERRITIN 35 01/26/2018   Attestation Statements:   Reviewed by clinician on day of visit: allergies, medications, problem list, medical history, surgical history, family history, social history, and previous encounter notes.   I, Trixie Dredge, am acting as transcriptionist for Dennard Nip, MD.  I have reviewed the above documentation for accuracy and completeness, and I agree with the above. -  Dennard Nip, MD

## 2020-09-02 ENCOUNTER — Other Ambulatory Visit (INDEPENDENT_AMBULATORY_CARE_PROVIDER_SITE_OTHER): Payer: Self-pay | Admitting: Family Medicine

## 2020-09-02 DIAGNOSIS — R7303 Prediabetes: Secondary | ICD-10-CM

## 2020-09-08 ENCOUNTER — Other Ambulatory Visit: Payer: Self-pay | Admitting: Family Medicine

## 2020-09-09 ENCOUNTER — Encounter (INDEPENDENT_AMBULATORY_CARE_PROVIDER_SITE_OTHER): Payer: Self-pay | Admitting: Family Medicine

## 2020-09-09 ENCOUNTER — Other Ambulatory Visit: Payer: Self-pay

## 2020-09-09 ENCOUNTER — Ambulatory Visit (INDEPENDENT_AMBULATORY_CARE_PROVIDER_SITE_OTHER): Payer: 59 | Admitting: Family Medicine

## 2020-09-09 VITALS — BP 147/72 | HR 63 | Temp 98.2°F | Ht 66.0 in | Wt 351.0 lb

## 2020-09-09 DIAGNOSIS — E559 Vitamin D deficiency, unspecified: Secondary | ICD-10-CM | POA: Diagnosis not present

## 2020-09-09 DIAGNOSIS — R7303 Prediabetes: Secondary | ICD-10-CM

## 2020-09-09 DIAGNOSIS — Z9189 Other specified personal risk factors, not elsewhere classified: Secondary | ICD-10-CM | POA: Diagnosis not present

## 2020-09-09 DIAGNOSIS — Z6841 Body Mass Index (BMI) 40.0 and over, adult: Secondary | ICD-10-CM | POA: Diagnosis not present

## 2020-09-09 MED ORDER — VITAMIN D (ERGOCALCIFEROL) 1.25 MG (50000 UNIT) PO CAPS: 50000.0000 [IU] | ORAL_CAPSULE | ORAL | 0 refills | Status: DC

## 2020-09-10 NOTE — Progress Notes (Signed)
Chief Complaint:   OBESITY Kelly Perez is here to discuss her progress with her obesity treatment plan along with follow-up of her obesity related diagnoses. James is on following a lower carbohydrate, vegetable and lean protein rich diet plan and states she is following her eating plan approximately 95% of the time. Kelly Perez states she is walking the dog and doing tai chi for 30 minutes 7 times per week.  Today's visit was #: 6 Starting weight: 372 lbs Starting date: 06/17/2020 Today's weight: 351 lbs Today's date: 09/09/2020 Total lbs lost to date: 21 Total lbs lost since last in-office visit: 3  Interim History: Kelly Perez continues to do well with weight loss. She is getting creative with her vegetables. She will be off work in the next 2 weeks and she plans to keep busy with home and garden projects.  Subjective:   1. Vitamin D deficiency Kelly Perez is stable on Vit D, but her level is not yet at goal. She denies nausea or vomiting.  2. Pre-diabetes Kelly Perez is tolerating metformin as long as she takes it with food.  3. At risk for dehydration Kelly Perez is at risk for dehydration due to the low carbohydrate plan and weight loss.  Assessment/Plan:   1. Vitamin D deficiency Low Vitamin D level contributes to fatigue and are associated with obesity, breast, and colon cancer. We will refill prescription Vitamin D for 1 month, and we will recheck labs at her next visit. Kelly Perez will follow-up for routine testing of Vitamin D, at least 2-3 times per year to avoid over-replacement.  - Vitamin D, Ergocalciferol, (DRISDOL) 1.25 MG (50000 UNIT) CAPS capsule; Take 1 capsule (50,000 Units total) by mouth every 7 (seven) days.  Dispense: 4 capsule; Refill: 0  2. Pre-diabetes Kelly Perez will continue metformin, and will continue to work on weight loss, exercise, and decreasing simple carbohydrates to help decrease the risk of diabetes. We will recheck labs at her next visit.  3. At risk for dehydration Kelly Perez was  given approximately 15 minutes dehydration prevention counseling today. Kelly Perez is at risk for dehydration due to weight loss and current medication(s). She was encouraged to hydrate and monitor fluid status to avoid dehydration as well as weight loss plateaus.   4. Class 3 severe obesity with serious comorbidity and body mass index (BMI) of 50.0 to 59.9 in adult, unspecified obesity type (HCC) Kelly Perez is currently in the action stage of change. As such, her goal is to continue with weight loss efforts. She has agreed to following a lower carbohydrate, vegetable and lean protein rich diet plan.   We will recheck fasting labs at her next visit.  Exercise goals: As is.  Behavioral modification strategies: increasing lean protein intake, increasing water intake, meal planning and cooking strategies and holiday eating strategies .  Kelly Perez has agreed to follow-up with our clinic in 3 weeks. She was informed of the importance of frequent follow-up visits to maximize her success with intensive lifestyle modifications for her multiple health conditions.   Objective:   Blood pressure (!) 147/72, pulse 63, temperature 98.2 F (36.8 C), height 5' 6"  (1.676 m), weight (!) 351 lb (159.2 kg), last menstrual period 02/13/2018, SpO2 99 %. Body mass index is 56.65 kg/m.  General: Cooperative, alert, well developed, in no acute distress. HEENT: Conjunctivae and lids unremarkable. Cardiovascular: Regular rhythm.  Lungs: Normal work of breathing. Neurologic: No focal deficits.   Lab Results  Component Value Date   CREATININE 0.70 06/17/2020   BUN 21 06/17/2020  NA 138 06/17/2020   K 5.1 06/17/2020   CL 100 06/17/2020   CO2 21 06/17/2020   Lab Results  Component Value Date   ALT 49 (H) 06/17/2020   AST 28 06/17/2020   ALKPHOS 128 (H) 06/17/2020   BILITOT 0.4 06/17/2020   Lab Results  Component Value Date   HGBA1C 6.4 (H) 06/17/2020   Lab Results  Component Value Date   INSULIN 27.4 (H)  06/17/2020   Lab Results  Component Value Date   TSH 2.690 06/17/2020   Lab Results  Component Value Date   CHOL 232 (H) 06/17/2020   HDL 46 06/17/2020   LDLCALC 160 (H) 06/17/2020   TRIG 144 06/17/2020   CHOLHDL 4 06/22/2018   Lab Results  Component Value Date   WBC 10.9 (H) 06/17/2020   HGB 14.9 06/17/2020   HCT 45.5 06/17/2020   MCV 89 06/17/2020   PLT 451 (H) 06/17/2020   Lab Results  Component Value Date   IRON 82 01/26/2018   TIBC 391 01/26/2018   FERRITIN 35 01/26/2018   Attestation Statements:   Reviewed by clinician on day of visit: allergies, medications, problem list, medical history, surgical history, family history, social history, and previous encounter notes.   I, Trixie Dredge, am acting as transcriptionist for Dennard Nip, MD.  I have reviewed the above documentation for accuracy and completeness, and I agree with the above. -  Dennard Nip, MD

## 2020-09-27 ENCOUNTER — Other Ambulatory Visit (INDEPENDENT_AMBULATORY_CARE_PROVIDER_SITE_OTHER): Payer: Self-pay | Admitting: Family Medicine

## 2020-09-27 DIAGNOSIS — E559 Vitamin D deficiency, unspecified: Secondary | ICD-10-CM

## 2020-09-29 ENCOUNTER — Encounter (INDEPENDENT_AMBULATORY_CARE_PROVIDER_SITE_OTHER): Payer: Self-pay | Admitting: Family Medicine

## 2020-09-29 ENCOUNTER — Ambulatory Visit (INDEPENDENT_AMBULATORY_CARE_PROVIDER_SITE_OTHER): Payer: 59 | Admitting: Family Medicine

## 2020-09-29 ENCOUNTER — Other Ambulatory Visit: Payer: Self-pay

## 2020-09-29 VITALS — BP 113/69 | HR 65 | Temp 98.2°F | Ht 66.0 in | Wt 349.0 lb

## 2020-09-29 DIAGNOSIS — Z6841 Body Mass Index (BMI) 40.0 and over, adult: Secondary | ICD-10-CM

## 2020-09-29 DIAGNOSIS — E7849 Other hyperlipidemia: Secondary | ICD-10-CM | POA: Diagnosis not present

## 2020-09-29 DIAGNOSIS — E559 Vitamin D deficiency, unspecified: Secondary | ICD-10-CM | POA: Diagnosis not present

## 2020-09-29 DIAGNOSIS — R7303 Prediabetes: Secondary | ICD-10-CM | POA: Diagnosis not present

## 2020-09-29 DIAGNOSIS — E538 Deficiency of other specified B group vitamins: Secondary | ICD-10-CM | POA: Diagnosis not present

## 2020-09-29 DIAGNOSIS — D509 Iron deficiency anemia, unspecified: Secondary | ICD-10-CM

## 2020-09-29 DIAGNOSIS — Z9189 Other specified personal risk factors, not elsewhere classified: Secondary | ICD-10-CM | POA: Diagnosis not present

## 2020-09-29 MED ORDER — VITAMIN D (ERGOCALCIFEROL) 1.25 MG (50000 UNIT) PO CAPS: 50000.0000 [IU] | ORAL_CAPSULE | ORAL | 0 refills | Status: DC

## 2020-09-29 MED ORDER — METFORMIN HCL 500 MG PO TABS: 500.0000 mg | ORAL_TABLET | Freq: Every morning | ORAL | 0 refills | Status: DC

## 2020-09-30 LAB — COMPREHENSIVE METABOLIC PANEL
ALT: 37 IU/L — ABNORMAL HIGH (ref 0–32)
AST: 19 IU/L (ref 0–40)
Albumin/Globulin Ratio: 1.6 (ref 1.2–2.2)
Albumin: 4.4 g/dL (ref 3.8–4.9)
Alkaline Phosphatase: 113 IU/L (ref 44–121)
BUN/Creatinine Ratio: 23 (ref 9–23)
BUN: 15 mg/dL (ref 6–24)
Bilirubin Total: 0.4 mg/dL (ref 0.0–1.2)
CO2: 24 mmol/L (ref 20–29)
Calcium: 9.4 mg/dL (ref 8.7–10.2)
Chloride: 101 mmol/L (ref 96–106)
Creatinine, Ser: 0.66 mg/dL (ref 0.57–1.00)
GFR calc Af Amer: 115 mL/min/{1.73_m2} (ref >59)
GFR calc non Af Amer: 100 mL/min/{1.73_m2} (ref >59)
Globulin, Total: 2.8 g/dL (ref 1.5–4.5)
Glucose: 107 mg/dL — ABNORMAL HIGH (ref 65–99)
Potassium: 5 mmol/L (ref 3.5–5.2)
Sodium: 140 mmol/L (ref 134–144)
Total Protein: 7.2 g/dL (ref 6.0–8.5)

## 2020-09-30 LAB — FOLATE: Folate: 3.6 ng/mL (ref >3.0)

## 2020-09-30 LAB — LIPID PANEL WITH LDL/HDL RATIO
Cholesterol, Total: 212 mg/dL — ABNORMAL HIGH (ref 100–199)
HDL: 41 mg/dL (ref >39)
LDL Chol Calc (NIH): 147 mg/dL — ABNORMAL HIGH (ref 0–99)
LDL/HDL Ratio: 3.6 ratio — ABNORMAL HIGH (ref 0.0–3.2)
Triglycerides: 133 mg/dL (ref 0–149)
VLDL Cholesterol Cal: 24 mg/dL (ref 5–40)

## 2020-09-30 LAB — CBC WITH DIFFERENTIAL/PLATELET
Basophils Absolute: 0.1 10*3/uL (ref 0.0–0.2)
Basos: 1 %
EOS (ABSOLUTE): 0.3 10*3/uL (ref 0.0–0.4)
Eos: 4 %
Hematocrit: 43.4 % (ref 34.0–46.6)
Hemoglobin: 14.3 g/dL (ref 11.1–15.9)
Immature Grans (Abs): 0 10*3/uL (ref 0.0–0.1)
Immature Granulocytes: 0 %
Lymphocytes Absolute: 2 10*3/uL (ref 0.7–3.1)
Lymphs: 20 %
MCH: 27.8 pg (ref 26.6–33.0)
MCHC: 32.9 g/dL (ref 31.5–35.7)
MCV: 84 fL (ref 79–97)
Monocytes Absolute: 0.8 10*3/uL (ref 0.1–0.9)
Monocytes: 8 %
Neutrophils Absolute: 6.6 10*3/uL (ref 1.4–7.0)
Neutrophils: 67 %
Platelets: 436 10*3/uL (ref 150–450)
RBC: 5.15 x10E6/uL (ref 3.77–5.28)
RDW: 13 % (ref 11.7–15.4)
WBC: 9.8 10*3/uL (ref 3.4–10.8)

## 2020-09-30 LAB — HEMOGLOBIN A1C
Est. average glucose Bld gHb Est-mCnc: 140 mg/dL
Hgb A1c MFr Bld: 6.5 % — ABNORMAL HIGH (ref 4.8–5.6)

## 2020-09-30 LAB — VITAMIN D 25 HYDROXY (VIT D DEFICIENCY, FRACTURES): Vit D, 25-Hydroxy: 60.8 ng/mL (ref 30.0–100.0)

## 2020-09-30 LAB — VITAMIN B12: Vitamin B-12: 1271 pg/mL — ABNORMAL HIGH (ref 232–1245)

## 2020-09-30 LAB — INSULIN, RANDOM: INSULIN: 15.2 u[IU]/mL (ref 2.6–24.9)

## 2020-09-30 NOTE — Progress Notes (Signed)
Chief Complaint:   OBESITY Kelly Perez is here to discuss her progress with her obesity treatment plan along with follow-up of her obesity related diagnoses. Kelly Perez is on following a lower carbohydrate, vegetable and lean protein rich diet plan and states she is following her eating plan approximately 85% of the time. Kelly Perez states she is dog walking, doing Tai chi, and yard work for 35 minutes 7 times per week.  Today's visit was #: 7 Starting weight: 372 lbs Starting date: 06/17/2020 Today's weight: 349 lbs Today's date: 09/29/2020 Total lbs lost to date: 23 Total lbs lost since last in-office visit: 2  Interim History: Kelly Perez continues to do well with weight loss on her Low carbohydrate plan. Her hunger is controlled and her celiac diagnosis is well controlled.  Subjective:   1. Pre-diabetes Kelly Perez's last A1c was elevated at 6.4. She is doing well with diet and metformin.  2. Vitamin D deficiency Kelly Perez is on Vit D, and she is due for labs.  3. Folate deficiency Kelly Perez has a history of celiac and low folic acid. She is working on diet, and she is due for labs.  4. Vitamin B12 deficiency Kelly Perez is over-replaced on Vit B12. She has decreased B12 supplement, but she is now on a B12 rich diet.  5. Mixed hyperlipidemia Kelly Perez is working on diet and exercise, and she is due for labs.  6. Iron deficiency anemia, unspecified iron deficiency anemia type Kelly Perez has a history of low hemoglobin. She is working on increasing iron in her diet.  7. At risk for dehydration Kelly Perez is at risk for dehydration due to inadequate water intake.  Assessment/Plan:   1. Pre-diabetes Makiya will continue to work on weight loss, exercise, and decreasing simple carbohydrates to help decrease the risk of diabetes. We will check labs today, and we will refill metformin for 1 month.  - Comprehensive metabolic panel - Insulin, random - Hemoglobin A1c - metFORMIN (GLUCOPHAGE) 500 MG tablet; Take 1 tablet (500 mg  total) by mouth every morning.  Dispense: 30 tablet; Refill: 0  2. Vitamin D deficiency Low Vitamin D level contributes to fatigue and are associated with obesity, breast, and colon cancer. We will check labs today, and we will refill prescription Vitamin D for 1 month. Kelly Perez will follow-up for routine testing of Vitamin D, at least 2-3 times per year to avoid over-replacement.  - VITAMIN D 25 Hydroxy (Vit-D Deficiency, Fractures) - Vitamin D, Ergocalciferol, (DRISDOL) 1.25 MG (50000 UNIT) CAPS capsule; Take 1 capsule (50,000 Units total) by mouth every 7 (seven) days.  Dispense: 4 capsule; Refill: 0  3. Folate deficiency We will check labs today, and Kelly Perez will continue to follow up as directed. - Folate  4. Vitamin B12 deficiency The diagnosis was reviewed with the patient. We will check labs today, and will continue to monitor. Kelly Perez will continue to follow up as directed. Orders and follow up as documented in patient record.  - Vitamin B12  5. Mixed hyperlipidemia Cardiovascular risk and specific lipid/LDL goals reviewed. We discussed several lifestyle modifications today. Kelly Perez will continue to work on diet, exercise and weight loss efforts. We will check labs today. Orders and follow up as documented in patient record.   - Lipid Panel With LDL/HDL Ratio  6. Iron deficiency anemia, unspecified iron deficiency anemia type We will check labs today. Anysa will continue to follow up as directed. Orders and follow up as documented in patient record.  - CBC with Differential/Platelet  7.  At risk for dehydration Kelly Perez was given approximately 15 minutes dehydration prevention counseling today. Kelly Perez is at risk for dehydration due to weight loss and current medication(s). She was encouraged to hydrate and monitor fluid status to avoid dehydration as well as weight loss plateaus.   8. Class 3 severe obesity with serious comorbidity and body mass index (BMI) of 50.0 to 59.9 in adult,  unspecified obesity type (HCC) Tehila is currently in the action stage of change. As such, her goal is to continue with weight loss efforts. She has agreed to following a lower carbohydrate, vegetable and lean protein rich diet plan.   Exercise goals: As is.  Behavioral modification strategies: increasing water intake and holiday eating strategies .  Kelly Perez has agreed to follow-up with our clinic in 2 weeks. She was informed of the importance of frequent follow-up visits to maximize her success with intensive lifestyle modifications for her multiple health conditions.   Kelly Perez was informed we would discuss her lab results at her next visit unless there is a critical issue that needs to be addressed sooner. Kelly Perez agreed to keep her next visit at the agreed upon time to discuss these results.  Objective:   Blood pressure 113/69, pulse 65, temperature 98.2 F (36.8 C), height 5' 6"  (1.676 m), weight (!) 349 lb (158.3 kg), last menstrual period 02/13/2018, SpO2 96 %. Body mass index is 56.33 kg/m.  General: Cooperative, alert, well developed, in no acute distress. HEENT: Conjunctivae and lids unremarkable. Cardiovascular: Regular rhythm.  Lungs: Normal work of breathing. Neurologic: No focal deficits.   Lab Results  Component Value Date   CREATININE 0.66 09/29/2020   BUN 15 09/29/2020   NA 140 09/29/2020   K 5.0 09/29/2020   CL 101 09/29/2020   CO2 24 09/29/2020   Lab Results  Component Value Date   ALT 37 (H) 09/29/2020   AST 19 09/29/2020   ALKPHOS 113 09/29/2020   BILITOT 0.4 09/29/2020   Lab Results  Component Value Date   HGBA1C 6.5 (H) 09/29/2020   HGBA1C 6.4 (H) 06/17/2020   Lab Results  Component Value Date   INSULIN 15.2 09/29/2020   INSULIN 27.4 (H) 06/17/2020   Lab Results  Component Value Date   TSH 2.690 06/17/2020   Lab Results  Component Value Date   CHOL 212 (H) 09/29/2020   HDL 41 09/29/2020   LDLCALC 147 (H) 09/29/2020   TRIG 133 09/29/2020    CHOLHDL 4 06/22/2018   Lab Results  Component Value Date   WBC 9.8 09/29/2020   HGB 14.3 09/29/2020   HCT 43.4 09/29/2020   MCV 84 09/29/2020   PLT 436 09/29/2020   Lab Results  Component Value Date   IRON 82 01/26/2018   TIBC 391 01/26/2018   FERRITIN 35 01/26/2018   Attestation Statements:   Reviewed by clinician on day of visit: allergies, medications, problem list, medical history, surgical history, family history, social history, and previous encounter notes.   I, Trixie Dredge, am acting as transcriptionist for Dennard Nip, MD.  I have reviewed the above documentation for accuracy and completeness, and I agree with the above. -  Dennard Nip, MD

## 2020-10-02 ENCOUNTER — Other Ambulatory Visit (INDEPENDENT_AMBULATORY_CARE_PROVIDER_SITE_OTHER): Payer: Self-pay | Admitting: Family Medicine

## 2020-10-02 DIAGNOSIS — R7303 Prediabetes: Secondary | ICD-10-CM

## 2020-10-02 NOTE — Telephone Encounter (Signed)
Dr.Beasley 

## 2020-10-13 ENCOUNTER — Encounter (INDEPENDENT_AMBULATORY_CARE_PROVIDER_SITE_OTHER): Payer: Self-pay | Admitting: Adult Health

## 2020-10-13 ENCOUNTER — Ambulatory Visit (INDEPENDENT_AMBULATORY_CARE_PROVIDER_SITE_OTHER): Payer: 59 | Admitting: Adult Health

## 2020-10-13 ENCOUNTER — Other Ambulatory Visit: Payer: Self-pay

## 2020-10-13 VITALS — BP 139/81 | HR 72 | Temp 98.0°F | Ht 66.0 in | Wt 344.0 lb

## 2020-10-13 DIAGNOSIS — Z6841 Body Mass Index (BMI) 40.0 and over, adult: Secondary | ICD-10-CM

## 2020-10-13 DIAGNOSIS — E559 Vitamin D deficiency, unspecified: Secondary | ICD-10-CM

## 2020-10-13 DIAGNOSIS — I10 Essential (primary) hypertension: Secondary | ICD-10-CM

## 2020-10-13 DIAGNOSIS — Z9189 Other specified personal risk factors, not elsewhere classified: Secondary | ICD-10-CM | POA: Diagnosis not present

## 2020-10-13 DIAGNOSIS — R7401 Elevation of levels of liver transaminase levels: Secondary | ICD-10-CM

## 2020-10-13 DIAGNOSIS — E118 Type 2 diabetes mellitus with unspecified complications: Secondary | ICD-10-CM | POA: Diagnosis not present

## 2020-10-13 DIAGNOSIS — E1169 Type 2 diabetes mellitus with other specified complication: Secondary | ICD-10-CM | POA: Diagnosis not present

## 2020-10-13 DIAGNOSIS — E785 Hyperlipidemia, unspecified: Secondary | ICD-10-CM

## 2020-10-13 DIAGNOSIS — K900 Celiac disease: Secondary | ICD-10-CM

## 2020-10-13 MED ORDER — ATORVASTATIN CALCIUM 20 MG PO TABS: 20.0000 mg | ORAL_TABLET | Freq: Every day | ORAL | 0 refills | Status: DC

## 2020-10-13 MED ORDER — METFORMIN HCL 500 MG PO TABS: 500.0000 mg | ORAL_TABLET | Freq: Two times a day (BID) | ORAL | 0 refills | Status: DC

## 2020-10-13 NOTE — Progress Notes (Signed)
Chief Complaint:   OBESITY Kelly Perez is here to discuss her progress with her obesity treatment plan along with follow-up of her obesity related diagnoses. Kelly Perez is following a lower carbohydrate, vegetable and lean protein rich diet plan and states she is following her eating plan approximately 90-95% of the time. Kelly Perez states she is walking the dog 20 minutes 7 times per week.  Today's visit was #: 8 Starting weight: 372 lbs Starting date: 06/17/2020 Today's weight: 344 lbs Today's date: 10/13/2020 Total lbs lost to date: 28 Total lbs lost since last in-office visit: 5  Interim History: She continues to enjoy the foods and structure of the low carb plan. She denies feeling deprived or polyphagia when eating on plan.  She has enjoyed great success on the low carb plan- lost 5 lbs since last OV.  Kelly Perez has not seen her PCP/Dr. Jonni Perez, since 2019. She has hx of Celiac disease and can be quite sensitive to medications.  She has yet to establish with local Gastroenterologist.  Subjective:   Vitamin D deficiency. Vitamin D level on 09/29/2020 was 60.8, at goal. Labs were discussed with the patient today.    Ref. Range 09/29/2020 10:58  Vitamin D, 25-Hydroxy Latest Ref Range: 30.0 - 100.0 ng/mL 60.8   Type 2 diabetes mellitus with complications ([place]). 09/29/2020 A1c 6.5, up from 6.4. Kelly Perez is on metformin 500 mg daily and will experience diarrhea if she does not take it with a full meal. Insulin level is improving. Labs were discussed with the patient today.   Lab Results  Component Value Date   HGBA1C 6.5 (H) 09/29/2020   HGBA1C 6.4 (H) 06/17/2020   Lab Results  Component Value Date   LDLCALC 147 (H) 09/29/2020   CREATININE 0.66 09/29/2020   Lab Results  Component Value Date   INSULIN 15.2 09/29/2020   INSULIN 27.4 (H) 06/17/2020   Hyperlipidemia associated with type 2 diabetes mellitus ([place]). Lipid panel on 09/29/2020 showed total cholesterol 212, HDL 41, triglycerides  133, and LDL 147. ASCVD risk 8.4%. Labs were discussed with the patient today.  Risks/benefits of statin therapy were discussed. She has family hx of CAD/MI.  She has not seen her PCP since 2019 and has not future appt's with Dr. Jonni Perez.  Will start statin therapy.  Lab Results  Component Value Date   CHOL 212 (H) 09/29/2020   HDL 41 09/29/2020   LDLCALC 147 (H) 09/29/2020   TRIG 133 09/29/2020   CHOLHDL 4 06/22/2018   Lab Results  Component Value Date   ALT 37 (H) 09/29/2020   AST 19 09/29/2020   ALKPHOS 113 09/29/2020   BILITOT 0.4 09/29/2020   The 10-year ASCVD risk score Mikey Bussing DC Jr., et al., 2013) is: 8.4%   Values used to calculate the score:     Age: 54 years     Sex: Female     Is Non-Hispanic African American: No     Diabetic: Yes     Tobacco smoker: No     Systolic Blood Pressure: 109 mmHg     Is BP treated: Yes     HDL Cholesterol: 41 mg/dL     Total Cholesterol: 212 mg/dL    Essential hypertension. Blood pressure and heart rate are stable on today's office visit. Kelly Perez is on bisoprolol fumarate 10 mg daily. She denies history of tachycardia. Labs were discussed with the patient today.   BP Readings from Last 3 Encounters:  10/13/20 139/81  09/29/20 113/69  09/09/20 (!) 147/72   Lab Results  Component Value Date   CREATININE 0.66 09/29/2020   CREATININE 0.70 06/17/2020   CREATININE 0.60 01/26/2018   Celiac disease. [facility] does not have a Database administrator.  Transaminitis. CMP on 09/29/2020 showed AST within normal limits, ALT 37, improved from 49. Latishia denies any ETOH/acetaminophen use - celiac disease.  At risk for hyperglycemia. Kelly Perez is at risk for hyperglycemia due to new onset T2D and obesity.   Assessment/Plan:   Vitamin D deficiency. Low Vitamin D level contributes to fatigue and are associated with obesity, breast, and colon cancer. She agrees to continue to take OTC Vitamin D3 5,000 IU daily and will follow-up for routine testing of Vitamin  D, at least 2-3 times per year to avoid over-replacement.  Type 2 diabetes mellitus with complications ([place]). Good blood sugar control is important to decrease the likelihood of diabetic complications such as nephropathy, neuropathy, limb loss, blindness, coronary artery disease, and death. Intensive lifestyle modification including diet, exercise and weight loss are the first line of treatment for diabetes. Refill was given for metFORMIN (GLUCOPHAGE) 500 MG tablet BID with meals #60 with 0 refills.  Hyperlipidemia associated with type 2 diabetes mellitus ([place]). Cardiovascular risk and specific lipid/LDL goals reviewed.  We discussed several lifestyle modifications today and Bryndle will continue to work on diet, exercise and weight loss efforts. Orders and follow up as documented in patient record. CMP and lipids will be checked in 6 weeks. Kelly Perez will start atorvastatin 20 mg daily #90 with 0 refills.  Counseling Intensive lifestyle modifications are the first line treatment for this issue. . Dietary changes: Increase soluble fiber. Decrease simple carbohydrates. . Exercise changes: Moderate to vigorous-intensity aerobic activity 150 minutes per week if tolerated.  . Lipid-lowering medications: see documented in medical record.  Essential hypertension. Kelly Perez is working on healthy weight loss and exercise to improve blood pressure control. We will watch for signs of hypotension as she continues her lifestyle modifications. She will continue her beta blocker as directed.   Celiac disease.  Ambulatory referral to [place], Kelly Perez.  Transaminitis. Will monitor labs.  At risk for hyperglycemia. Kelly Perez was given approximately 30 minutes of counseling today regarding prevention of hyperglycemia. She was advised of hyperglycemia causes and the fact hyperglycemia is often asymptomatic. Kelly Perez was instructed to avoid skipping meals, eat regular protein rich meals and schedule low calorie but protein rich  snacks as needed.   Repetitive spaced learning was employed today to elicit superior memory formation and behavioral change.   Class 3 severe obesity with serious comorbidity and body mass index (BMI) of 50.0 to 59.9 in adult, unspecified obesity type (Kelly Perez).  Kelly Perez is currently in the action stage of change. As such, her goal is to continue with weight loss efforts. She has agreed to following a lower carbohydrate, vegetable and lean protein rich diet plan.   Exercise goals: Kelly Perez will continue walking the dog 20 minutes 7 times per week.  Behavioral modification strategies: increasing lean protein intake, decreasing simple carbohydrates, meal planning and cooking strategies and planning for success.  Kelly Perez has agreed to follow-up with our clinic in 2-3 weeks. She was informed of the importance of frequent follow-up visits to maximize her success with intensive lifestyle modifications for her multiple health conditions.   Objective:   Blood pressure 139/81, pulse 72, temperature 98 F (36.7 C), height 5' 6"  (1.676 m), weight (!) 344 lb (156 kg), last menstrual period 02/13/2018, SpO2 97 %. Body mass index  is 55.52 kg/m.  General: Cooperative, alert, well developed, in no acute distress. HEENT: Conjunctivae and lids unremarkable. Cardiovascular: Regular rhythm.  Lungs: Normal work of breathing. Neurologic: No focal deficits.   Lab Results  Component Value Date   CREATININE 0.66 09/29/2020   BUN 15 09/29/2020   NA 140 09/29/2020   K 5.0 09/29/2020   CL 101 09/29/2020   CO2 24 09/29/2020   Lab Results  Component Value Date   ALT 37 (H) 09/29/2020   AST 19 09/29/2020   ALKPHOS 113 09/29/2020   BILITOT 0.4 09/29/2020   Lab Results  Component Value Date   HGBA1C 6.5 (H) 09/29/2020   HGBA1C 6.4 (H) 06/17/2020   Lab Results  Component Value Date   INSULIN 15.2 09/29/2020   INSULIN 27.4 (H) 06/17/2020   Lab Results  Component Value Date   TSH 2.690 06/17/2020   Lab  Results  Component Value Date   CHOL 212 (H) 09/29/2020   HDL 41 09/29/2020   LDLCALC 147 (H) 09/29/2020   TRIG 133 09/29/2020   CHOLHDL 4 06/22/2018   Lab Results  Component Value Date   WBC 9.8 09/29/2020   HGB 14.3 09/29/2020   HCT 43.4 09/29/2020   MCV 84 09/29/2020   PLT 436 09/29/2020   Lab Results  Component Value Date   IRON 82 01/26/2018   TIBC 391 01/26/2018   FERRITIN 35 01/26/2018   Attestation Statements:   Reviewed by clinician on day of visit: allergies, medications, problem list, medical history, surgical history, family history, social history, and previous encounter notes.  I, Michaelene Song, am acting as Location manager for [employer], NP-C   I have reviewed the above documentation for accuracy and completeness, and I agree with the above. -  [place] Leap d. Lakisa Lotz, NP-C

## 2020-10-14 DIAGNOSIS — E1169 Type 2 diabetes mellitus with other specified complication: Secondary | ICD-10-CM | POA: Insufficient documentation

## 2020-10-14 DIAGNOSIS — R7401 Elevation of levels of liver transaminase levels: Secondary | ICD-10-CM | POA: Insufficient documentation

## 2020-10-14 DIAGNOSIS — E7849 Other hyperlipidemia: Secondary | ICD-10-CM | POA: Insufficient documentation

## 2020-10-14 DIAGNOSIS — E118 Type 2 diabetes mellitus with unspecified complications: Secondary | ICD-10-CM | POA: Insufficient documentation

## 2020-10-14 HISTORY — DX: Elevation of levels of liver transaminase levels: R74.01

## 2020-10-28 ENCOUNTER — Other Ambulatory Visit (INDEPENDENT_AMBULATORY_CARE_PROVIDER_SITE_OTHER): Payer: Self-pay | Admitting: Family Medicine

## 2020-10-28 DIAGNOSIS — E559 Vitamin D deficiency, unspecified: Secondary | ICD-10-CM

## 2020-10-30 ENCOUNTER — Other Ambulatory Visit (INDEPENDENT_AMBULATORY_CARE_PROVIDER_SITE_OTHER): Payer: Self-pay | Admitting: Family Medicine

## 2020-10-30 DIAGNOSIS — E118 Type 2 diabetes mellitus with unspecified complications: Secondary | ICD-10-CM

## 2020-10-30 NOTE — Telephone Encounter (Signed)
This patient was last seen by Mina Marble, FNP and currently has an upcoming appt with Dr. Leafy Ro on 11/04/20.

## 2020-11-04 ENCOUNTER — Ambulatory Visit (INDEPENDENT_AMBULATORY_CARE_PROVIDER_SITE_OTHER): Payer: 59 | Admitting: Family Medicine

## 2020-11-04 ENCOUNTER — Encounter (INDEPENDENT_AMBULATORY_CARE_PROVIDER_SITE_OTHER): Payer: Self-pay | Admitting: Family Medicine

## 2020-11-04 ENCOUNTER — Other Ambulatory Visit: Payer: Self-pay

## 2020-11-04 VITALS — BP 135/84 | HR 72 | Temp 98.0°F | Ht 66.0 in | Wt 344.0 lb

## 2020-11-04 DIAGNOSIS — K900 Celiac disease: Secondary | ICD-10-CM

## 2020-11-04 DIAGNOSIS — R7303 Prediabetes: Secondary | ICD-10-CM

## 2020-11-04 DIAGNOSIS — Z6841 Body Mass Index (BMI) 40.0 and over, adult: Secondary | ICD-10-CM

## 2020-11-04 DIAGNOSIS — Z9189 Other specified personal risk factors, not elsewhere classified: Secondary | ICD-10-CM

## 2020-11-04 MED ORDER — METFORMIN HCL 500 MG PO TABS
500.0000 mg | ORAL_TABLET | Freq: Two times a day (BID) | ORAL | 0 refills | Status: DC
Start: 2020-11-04 — End: 2020-11-18

## 2020-11-05 NOTE — Progress Notes (Signed)
Chief Complaint:   OBESITY Kelly Perez is here to discuss her progress with her obesity treatment plan along with follow-up of her obesity related diagnoses. Kelly Perez is on following a lower carbohydrate, vegetable and lean protein rich diet plan and states she is following her eating plan approximately 85% of the time. Kelly Perez states she is walking with Tai chi for 25 minutes 7 times per week.  Today's visit was #: 9 Starting weight: 372 lbs Starting date: 06/17/2020 Today's weight: 344 lbs Today's date: 11/04/2020 Total lbs lost to date: 28 Total lbs lost since last in-office visit: 0  Interim History: Kelly Perez did very well avoiding weight gain over the holidays. She is tolerating a lower carbohydrate plan well. We discussed a variety of meal options, but the low carbohydrate fits her best especially with celiac disease.  Subjective:   1. Pre-diabetes Kelly Perez increased metformin to BID and she noted increased GI upset, which has not yet resolved after 3 weeks.  2. Celiac disease Kelly Perez is doing well on her eating plan. She is looking at low gluten recipes and trying new ingredients.  3. At risk for heart disease Kelly Perez is at a higher than average risk for cardiovascular disease due to obesity.   Assessment/Plan:   1. Pre-diabetes Kelly Perez will continue to work on weight loss, exercise, and decreasing simple carbohydrates to help decrease the risk of diabetes. Kelly Perez agreed to change metformin to 1 tablet PO q daily at lunch and 1/2 tablet PO q daily at dinner, and will follow up as directed.  - metFORMIN (GLUCOPHAGE) 500 MG tablet; Take 1 tablet (500 mg total) by mouth 2 (two) times daily with a meal. Take 1 tablet at lunch and 1/2 at dinner  Dispense: 45 tablet; Refill: 0  2. Celiac disease Kelly Perez will continue with diet and exercise, and will continue to follow up as directed.  3. At risk for heart disease Kelly Perez was given approximately 15 minutes of coronary artery disease prevention  counseling today. She is 55 y.o. female and has risk factors for heart disease including obesity. We discussed intensive lifestyle modifications today with an emphasis on specific weight loss instructions and strategies.   Repetitive spaced learning was employed today to elicit superior memory formation and behavioral change.  4. Class 3 severe obesity with serious comorbidity and body mass index (BMI) of 50.0 to 59.9 in adult, unspecified obesity type (HCC) Kelly Perez is currently in the action stage of change. As such, her goal is to continue with weight loss efforts. She has agreed to following a lower carbohydrate, vegetable and lean protein rich diet plan.   Exercise goals: As is.  Behavioral modification strategies: meal planning and cooking strategies.  Kelly Perez has agreed to follow-up with our clinic in 2 to 3 weeks. She was informed of the importance of frequent follow-up visits to maximize her success with intensive lifestyle modifications for her multiple health conditions.   Objective:   Blood pressure 135/84, pulse 72, temperature 98 F (36.7 C), temperature source Oral, height 5' 6"  (1.676 m), weight (!) 344 lb (156 kg), last menstrual period 02/13/2018, SpO2 98 %. Body mass index is 55.52 kg/m.  General: Cooperative, alert, well developed, in no acute distress. HEENT: Conjunctivae and lids unremarkable. Cardiovascular: Regular rhythm.  Lungs: Normal work of breathing. Neurologic: No focal deficits.   Lab Results  Component Value Date   CREATININE 0.66 09/29/2020   BUN 15 09/29/2020   NA 140 09/29/2020   K 5.0 09/29/2020  CL 101 09/29/2020   CO2 24 09/29/2020   Lab Results  Component Value Date   ALT 37 (H) 09/29/2020   AST 19 09/29/2020   ALKPHOS 113 09/29/2020   BILITOT 0.4 09/29/2020   Lab Results  Component Value Date   HGBA1C 6.5 (H) 09/29/2020   HGBA1C 6.4 (H) 06/17/2020   Lab Results  Component Value Date   INSULIN 15.2 09/29/2020   INSULIN 27.4 (H)  06/17/2020   Lab Results  Component Value Date   TSH 2.690 06/17/2020   Lab Results  Component Value Date   CHOL 212 (H) 09/29/2020   HDL 41 09/29/2020   LDLCALC 147 (H) 09/29/2020   TRIG 133 09/29/2020   CHOLHDL 4 06/22/2018   Lab Results  Component Value Date   WBC 9.8 09/29/2020   HGB 14.3 09/29/2020   HCT 43.4 09/29/2020   MCV 84 09/29/2020   PLT 436 09/29/2020   Lab Results  Component Value Date   IRON 82 01/26/2018   TIBC 391 01/26/2018   FERRITIN 35 01/26/2018   Attestation Statements:   Reviewed by clinician on day of visit: allergies, medications, problem list, medical history, surgical history, family history, social history, and previous encounter notes.   I, Trixie Dredge, am acting as transcriptionist for Dennard Nip, MD.  I have reviewed the above documentation for accuracy and completeness, and I agree with the above. -  Dennard Nip, MD

## 2020-11-06 ENCOUNTER — Other Ambulatory Visit (INDEPENDENT_AMBULATORY_CARE_PROVIDER_SITE_OTHER): Payer: Self-pay | Admitting: Adult Health

## 2020-11-06 DIAGNOSIS — R7303 Prediabetes: Secondary | ICD-10-CM

## 2020-11-17 ENCOUNTER — Ambulatory Visit (INDEPENDENT_AMBULATORY_CARE_PROVIDER_SITE_OTHER): Payer: 59 | Admitting: Family Medicine

## 2020-11-17 ENCOUNTER — Other Ambulatory Visit: Payer: Self-pay

## 2020-11-17 ENCOUNTER — Encounter (INDEPENDENT_AMBULATORY_CARE_PROVIDER_SITE_OTHER): Payer: Self-pay | Admitting: Family Medicine

## 2020-11-17 VITALS — BP 116/73 | HR 74 | Temp 98.4°F | Ht 66.0 in | Wt 339.0 lb

## 2020-11-17 DIAGNOSIS — Z6841 Body Mass Index (BMI) 40.0 and over, adult: Secondary | ICD-10-CM

## 2020-11-17 DIAGNOSIS — E1169 Type 2 diabetes mellitus with other specified complication: Secondary | ICD-10-CM | POA: Diagnosis not present

## 2020-11-18 ENCOUNTER — Other Ambulatory Visit (INDEPENDENT_AMBULATORY_CARE_PROVIDER_SITE_OTHER): Payer: Self-pay

## 2020-11-18 ENCOUNTER — Encounter (INDEPENDENT_AMBULATORY_CARE_PROVIDER_SITE_OTHER): Payer: Self-pay | Admitting: Family Medicine

## 2020-11-18 DIAGNOSIS — R7303 Prediabetes: Secondary | ICD-10-CM

## 2020-11-18 MED ORDER — METFORMIN HCL 500 MG PO TABS: ORAL_TABLET | ORAL | 0 refills | Status: DC

## 2020-11-18 NOTE — Telephone Encounter (Signed)
Yes, refill x 1

## 2020-11-18 NOTE — Progress Notes (Signed)
Chief Complaint:   OBESITY Chloie is here to discuss her progress with her obesity treatment plan along with follow-up of her obesity related diagnoses. Aurea is on following a lower carbohydrate, vegetable and lean protein rich diet plan and states she is following her eating plan approximately 90% of the time. Iara states she is walking for 25 minutes 7 times per week.  Today's visit was #: 10 Starting weight: 372 lbs Starting date: 06/17/2020 Today's weight: 339 lbs Today's date: 11/17/2020 Total lbs lost to date: 33 Total lbs lost since last in-office visit: 5  Interim History: Jadamarie continues to do well with weight loss on her Low carbohydrate plan. She states her hunger is mostly controlled, and she is trying to start active.  Subjective:   1. Type 2 diabetes mellitus with other specified complication, without long-term current use of insulin ([place]) Shamela had GI upset with metformin BID, and she decreased to 1 and 1/2 tablet PO q daily and this seems better but she still has days with nausea. She is doing well with her diet.  Assessment/Plan:   1. Type 2 diabetes mellitus with other specified complication, without long-term current use of insulin (HCC) Good blood sugar control is important to decrease the likelihood of diabetic complications such as nephropathy, neuropathy, limb loss, blindness, coronary artery disease, and death. Intensive lifestyle modification including diet, exercise and weight loss are the first line of treatment for diabetes. Izetta is ok to decrease metformin to once daily until nausea passes. She is to continue diet and weight loss, and we will plan to recheck labs in 2-3 months.  2. Class 3 severe obesity with serious comorbidity and body mass index (BMI) of 50.0 to 59.9 in adult, unspecified obesity type (HCC) Marta is currently in the action stage of change. As such, her goal is to continue with weight loss efforts. She has agreed to following a lower  carbohydrate, vegetable and lean protein rich diet plan.   Exercise goals: As is.  Behavioral modification strategies: increasing water intake.  Kearston has agreed to follow-up with our clinic in 3 weeks. She was informed of the importance of frequent follow-up visits to maximize her success with intensive lifestyle modifications for her multiple health conditions.   Objective:   Blood pressure 116/73, pulse 74, temperature 98.4 F (36.9 C), height 5' 6"  (1.676 m), weight (!) 339 lb (153.8 kg), last menstrual period 02/13/2018, SpO2 96 %. Body mass index is 54.72 kg/m.  General: Cooperative, alert, well developed, in no acute distress. HEENT: Conjunctivae and lids unremarkable. Cardiovascular: Regular rhythm.  Lungs: Normal work of breathing. Neurologic: No focal deficits.   Lab Results  Component Value Date   CREATININE 0.66 09/29/2020   BUN 15 09/29/2020   NA 140 09/29/2020   K 5.0 09/29/2020   CL 101 09/29/2020   CO2 24 09/29/2020   Lab Results  Component Value Date   ALT 37 (H) 09/29/2020   AST 19 09/29/2020   ALKPHOS 113 09/29/2020   BILITOT 0.4 09/29/2020   Lab Results  Component Value Date   HGBA1C 6.5 (H) 09/29/2020   HGBA1C 6.4 (H) 06/17/2020   Lab Results  Component Value Date   INSULIN 15.2 09/29/2020   INSULIN 27.4 (H) 06/17/2020   Lab Results  Component Value Date   TSH 2.690 06/17/2020   Lab Results  Component Value Date   CHOL 212 (H) 09/29/2020   HDL 41 09/29/2020   LDLCALC 147 (H) 09/29/2020  TRIG 133 09/29/2020   CHOLHDL 4 06/22/2018   Lab Results  Component Value Date   WBC 9.8 09/29/2020   HGB 14.3 09/29/2020   HCT 43.4 09/29/2020   MCV 84 09/29/2020   PLT 436 09/29/2020   Lab Results  Component Value Date   IRON 82 01/26/2018   TIBC 391 01/26/2018   FERRITIN 35 01/26/2018   Attestation Statements:   Reviewed by clinician on day of visit: allergies, medications, problem list, medical history, surgical history, family  history, social history, and previous encounter notes.  Time spent on visit including pre-visit chart review and post-visit care and charting was 32 minutes.    I, Trixie Dredge, am acting as transcriptionist for Dennard Nip, MD.  I have reviewed the above documentation for accuracy and completeness, and I agree with the above. -  Dennard Nip, MD

## 2020-12-05 ENCOUNTER — Other Ambulatory Visit (INDEPENDENT_AMBULATORY_CARE_PROVIDER_SITE_OTHER): Payer: Self-pay | Admitting: Family Medicine

## 2020-12-05 DIAGNOSIS — R7303 Prediabetes: Secondary | ICD-10-CM

## 2020-12-08 ENCOUNTER — Encounter (INDEPENDENT_AMBULATORY_CARE_PROVIDER_SITE_OTHER): Payer: Self-pay | Admitting: Family Medicine

## 2020-12-08 ENCOUNTER — Ambulatory Visit (INDEPENDENT_AMBULATORY_CARE_PROVIDER_SITE_OTHER): Payer: 59 | Admitting: Family Medicine

## 2020-12-08 ENCOUNTER — Other Ambulatory Visit: Payer: Self-pay

## 2020-12-08 VITALS — BP 136/75 | HR 60 | Temp 97.8°F | Ht 66.0 in | Wt 339.0 lb

## 2020-12-08 DIAGNOSIS — I10 Essential (primary) hypertension: Secondary | ICD-10-CM | POA: Diagnosis not present

## 2020-12-08 DIAGNOSIS — Z9189 Other specified personal risk factors, not elsewhere classified: Secondary | ICD-10-CM | POA: Diagnosis not present

## 2020-12-08 DIAGNOSIS — E1169 Type 2 diabetes mellitus with other specified complication: Secondary | ICD-10-CM | POA: Diagnosis not present

## 2020-12-08 DIAGNOSIS — Z6841 Body Mass Index (BMI) 40.0 and over, adult: Secondary | ICD-10-CM

## 2020-12-08 MED ORDER — METFORMIN HCL 500 MG PO TABS: ORAL_TABLET | ORAL | 0 refills | Status: DC

## 2020-12-10 NOTE — Progress Notes (Signed)
Chief Complaint:   OBESITY Kelly Perez is here to discuss her progress with her obesity treatment plan along with follow-up of her obesity related diagnoses. Kelly Perez is on following a lower carbohydrate, vegetable and lean protein rich diet plan and states she is following her eating plan approximately 95% of the time. Kelly Perez states she is gardening, and walking for 30 minutes 7 times per week.  Today's visit was #: 11 Starting weight: 372 lbs Starting date: 06/17/2020 Today's weight: 339 lbs Today's date: 12/08/2020 Total lbs lost to date: 33 Total lbs lost since last in-office visit: 0  Interim History: Kelly Perez has done well maintaining her weight loss. She has been very active with moving pavers in her front yard and getting ready to garden this Spring.  Subjective:   1. Type 2 diabetes mellitus with other specified complication, without long-term current use of insulin ([place]) Kelly Perez increased her metformin to 1 tablet PO q AM and 1/2 tablet PO q PM, but she continues to struggle with GI upset with her 2nd dose. She is doing well with diet and exercise.  2. Essential hypertension Kelly Perez's blood pressure is controlled today. She is working on diet, exercise, and weight loss.  3. At risk for heart disease Kelly Perez is at a higher than average risk for cardiovascular disease due to obesity.   Assessment/Plan:   1. Type 2 diabetes mellitus with other specified complication, without long-term current use of insulin (HCC) Good blood sugar control is important to decrease the likelihood of diabetic complications such as nephropathy, neuropathy, limb loss, blindness, coronary artery disease, and death. Intensive lifestyle modification including diet, exercise and weight loss are the first line of treatment for diabetes. Kelly Perez agreed to decrease metformin to 500 mg q AM, and will continue to monitor.  - metFORMIN (GLUCOPHAGE) 500 MG tablet; Take 1 tablet at lunch and 1/2 at dinner  Dispense: 45 tablet;  Refill: 0  2. Essential hypertension Kelly Perez will continue diet and exercise to improve blood pressure control. We will watch for signs of hypotension as she continues her lifestyle modifications.  3. At risk for heart disease Kelly Perez was given approximately 15 minutes of coronary artery disease prevention counseling today. She is 55 y.o. female and has risk factors for heart disease including obesity. We discussed intensive lifestyle modifications today with an emphasis on specific weight loss instructions and strategies.   Repetitive spaced learning was employed today to elicit superior memory formation and behavioral change.  4. Class 3 severe obesity with serious comorbidity and body mass index (BMI) of 50.0 to 59.9 in adult, unspecified obesity type (HCC) Kelly Perez is currently in the action stage of change. As such, her goal is to continue with weight loss efforts. She has agreed to following a lower carbohydrate, vegetable and lean protein rich diet plan.   Exercise goals: As is.  Behavioral modification strategies: increasing lean protein intake.  Kelly Perez has agreed to follow-up with our clinic in 4 weeks. She was informed of the importance of frequent follow-up visits to maximize her success with intensive lifestyle modifications for her multiple health conditions.   Objective:   Blood pressure 136/75, pulse 60, temperature 97.8 F (36.6 C), height 5' 6"  (1.676 m), weight (!) 339 lb (153.8 kg), last menstrual period 02/13/2018, SpO2 97 %. Body mass index is 54.72 kg/m.  General: Cooperative, alert, well developed, in no acute distress. HEENT: Conjunctivae and lids unremarkable. Cardiovascular: Regular rhythm.  Lungs: Normal work of breathing. Neurologic: No focal deficits.  Lab Results  Component Value Date   CREATININE 0.66 09/29/2020   BUN 15 09/29/2020   NA 140 09/29/2020   K 5.0 09/29/2020   CL 101 09/29/2020   CO2 24 09/29/2020   Lab Results  Component Value Date   ALT  37 (H) 09/29/2020   AST 19 09/29/2020   ALKPHOS 113 09/29/2020   BILITOT 0.4 09/29/2020   Lab Results  Component Value Date   HGBA1C 6.5 (H) 09/29/2020   HGBA1C 6.4 (H) 06/17/2020   Lab Results  Component Value Date   INSULIN 15.2 09/29/2020   INSULIN 27.4 (H) 06/17/2020   Lab Results  Component Value Date   TSH 2.690 06/17/2020   Lab Results  Component Value Date   CHOL 212 (H) 09/29/2020   HDL 41 09/29/2020   LDLCALC 147 (H) 09/29/2020   TRIG 133 09/29/2020   CHOLHDL 4 06/22/2018   Lab Results  Component Value Date   WBC 9.8 09/29/2020   HGB 14.3 09/29/2020   HCT 43.4 09/29/2020   MCV 84 09/29/2020   PLT 436 09/29/2020   Lab Results  Component Value Date   IRON 82 01/26/2018   TIBC 391 01/26/2018   FERRITIN 35 01/26/2018   Attestation Statements:   Reviewed by clinician on day of visit: allergies, medications, problem list, medical history, surgical history, family history, social history, and previous encounter notes.   I, Trixie Dredge, am acting as transcriptionist for Dennard Nip, MD.  I have reviewed the above documentation for accuracy and completeness, and I agree with the above. -  Dennard Nip, MD

## 2020-12-24 ENCOUNTER — Other Ambulatory Visit (INDEPENDENT_AMBULATORY_CARE_PROVIDER_SITE_OTHER): Payer: Self-pay | Admitting: Family Medicine

## 2020-12-24 ENCOUNTER — Encounter (INDEPENDENT_AMBULATORY_CARE_PROVIDER_SITE_OTHER): Payer: Self-pay

## 2020-12-24 DIAGNOSIS — E1169 Type 2 diabetes mellitus with other specified complication: Secondary | ICD-10-CM

## 2020-12-24 NOTE — Telephone Encounter (Signed)
MyChart message sent to pt to find out if they have enough medication to get them through until next appt.   

## 2021-01-05 ENCOUNTER — Ambulatory Visit (INDEPENDENT_AMBULATORY_CARE_PROVIDER_SITE_OTHER): Payer: 59 | Admitting: Family Medicine

## 2021-01-05 ENCOUNTER — Other Ambulatory Visit: Payer: Self-pay

## 2021-01-05 ENCOUNTER — Encounter (INDEPENDENT_AMBULATORY_CARE_PROVIDER_SITE_OTHER): Payer: Self-pay | Admitting: Family Medicine

## 2021-01-05 VITALS — BP 135/81 | HR 64 | Temp 98.1°F | Ht 66.0 in | Wt 334.0 lb

## 2021-01-05 DIAGNOSIS — E1169 Type 2 diabetes mellitus with other specified complication: Secondary | ICD-10-CM | POA: Diagnosis not present

## 2021-01-05 DIAGNOSIS — Z9189 Other specified personal risk factors, not elsewhere classified: Secondary | ICD-10-CM

## 2021-01-05 DIAGNOSIS — Z6841 Body Mass Index (BMI) 40.0 and over, adult: Secondary | ICD-10-CM

## 2021-01-05 MED ORDER — METFORMIN HCL 500 MG PO TABS
ORAL_TABLET | ORAL | 0 refills | Status: DC
Start: 2021-01-05 — End: 2021-03-05

## 2021-01-08 NOTE — Progress Notes (Signed)
Chief Complaint:   OBESITY Kelly Perez is here to discuss her progress with her obesity treatment plan along with follow-up of her obesity related diagnoses. Kelly Perez is on following a lower carbohydrate, vegetable and lean protein rich diet plan and states she is following her eating plan approximately 95% of the time. Kelly Perez states she is walking for 30-35 minutes 7 times per week.  Today's visit was #: 12 Starting weight: 372 lbs Starting date: 06/17/2020 Today's weight: 334 lbs Today's date: 01/05/2021 Total lbs lost to date: 38 Total lbs lost since last in-office visit: 5  Interim History: Kelly Perez continues to do well with weight loss. She has decreased simple carbohydrates and increased activity.  Subjective:   1. Type 2 diabetes mellitus with other specified complication, without long-term current use of insulin (HCC) Kelly Perez is on metformin, and she is still struggling with some GI issues. She is doing very well with weight loss.  2. At risk for hypoglycemia Kelly Perez is at increased risk for hypoglycemia due to weight loss.  Assessment/Plan:   1. Type 2 diabetes mellitus with other specified complication, without long-term current use of insulin (HCC) Good blood sugar control is important to decrease the likelihood of diabetic complications such as nephropathy, neuropathy, limb loss, blindness, coronary artery disease, and death. Intensive lifestyle modification including diet, exercise and weight loss are the first line of treatment for diabetes. We will refill metformin for 1 month (Kelly Perez is ok to take 1/2 pill if GI issues continues).  - metFORMIN (GLUCOPHAGE) 500 MG tablet; Take 1 tablet at lunch and 1/2 at dinner  Dispense: 45 tablet; Refill: 0  2. At risk for hypoglycemia Kelly Perez was given approximately 15 minutes of counseling today regarding prevention of hypoglycemia. She was advised of symptoms of hypoglycemia. Kelly Perez was instructed to avoid skipping meals, eat regular protein rich  meals and schedule low calorie snacks as needed.   Repetitive spaced learning was employed today to elicit superior memory formation and behavioral change  3. Class 3 severe obesity with serious comorbidity and body mass index (BMI) of 50.0 to 59.9 in adult, unspecified obesity type (HCC) Kelly Perez is currently in the action stage of change. As such, her goal is to continue with weight loss efforts. She has agreed to following a lower carbohydrate, vegetable and lean protein rich diet plan.   Exercise goals: As is.  Behavioral modification strategies: increasing lean protein intake.  Kelly Perez has agreed to follow-up with our clinic in 4 weeks. She was informed of the importance of frequent follow-up visits to maximize her success with intensive lifestyle modifications for her multiple health conditions.   Objective:   Blood pressure 135/81, pulse 64, temperature 98.1 F (36.7 C), height 5' 6"  (1.676 m), weight (!) 334 lb (151.5 kg), last menstrual period 02/13/2018, SpO2 98 %. Body mass index is 53.91 kg/m.  General: Cooperative, alert, well developed, in no acute distress. HEENT: Conjunctivae and lids unremarkable. Cardiovascular: Regular rhythm.  Lungs: Normal work of breathing. Neurologic: No focal deficits.   Lab Results  Component Value Date   CREATININE 0.66 09/29/2020   BUN 15 09/29/2020   NA 140 09/29/2020   K 5.0 09/29/2020   CL 101 09/29/2020   CO2 24 09/29/2020   Lab Results  Component Value Date   ALT 37 (H) 09/29/2020   AST 19 09/29/2020   ALKPHOS 113 09/29/2020   BILITOT 0.4 09/29/2020   Lab Results  Component Value Date   HGBA1C 6.5 (H) 09/29/2020  HGBA1C 6.4 (H) 06/17/2020   Lab Results  Component Value Date   INSULIN 15.2 09/29/2020   INSULIN 27.4 (H) 06/17/2020   Lab Results  Component Value Date   TSH 2.690 06/17/2020   Lab Results  Component Value Date   CHOL 212 (H) 09/29/2020   HDL 41 09/29/2020   LDLCALC 147 (H) 09/29/2020   TRIG 133  09/29/2020   CHOLHDL 4 06/22/2018   Lab Results  Component Value Date   WBC 9.8 09/29/2020   HGB 14.3 09/29/2020   HCT 43.4 09/29/2020   MCV 84 09/29/2020   PLT 436 09/29/2020   Lab Results  Component Value Date   IRON 82 01/26/2018   TIBC 391 01/26/2018   FERRITIN 35 01/26/2018   Attestation Statements:   Reviewed by clinician on day of visit: allergies, medications, problem list, medical history, surgical history, family history, social history, and previous encounter notes.   I, Trixie Dredge, am acting as transcriptionist for Dennard Nip, MD.  I have reviewed the above documentation for accuracy and completeness, and I agree with the above. -  Dennard Nip, MD

## 2021-01-31 ENCOUNTER — Other Ambulatory Visit (INDEPENDENT_AMBULATORY_CARE_PROVIDER_SITE_OTHER): Payer: Self-pay | Admitting: Family Medicine

## 2021-01-31 DIAGNOSIS — E1169 Type 2 diabetes mellitus with other specified complication: Secondary | ICD-10-CM

## 2021-02-02 NOTE — Telephone Encounter (Signed)
Dr.Beasley 

## 2021-02-05 ENCOUNTER — Ambulatory Visit (INDEPENDENT_AMBULATORY_CARE_PROVIDER_SITE_OTHER): Payer: 59 | Admitting: Family Medicine

## 2021-02-05 ENCOUNTER — Encounter (INDEPENDENT_AMBULATORY_CARE_PROVIDER_SITE_OTHER): Payer: Self-pay | Admitting: Family Medicine

## 2021-02-05 ENCOUNTER — Other Ambulatory Visit: Payer: Self-pay

## 2021-02-05 VITALS — BP 129/79 | HR 68 | Temp 98.5°F | Ht 66.0 in | Wt 329.0 lb

## 2021-02-05 DIAGNOSIS — E785 Hyperlipidemia, unspecified: Secondary | ICD-10-CM

## 2021-02-05 DIAGNOSIS — E559 Vitamin D deficiency, unspecified: Secondary | ICD-10-CM

## 2021-02-05 DIAGNOSIS — Z6841 Body Mass Index (BMI) 40.0 and over, adult: Secondary | ICD-10-CM

## 2021-02-05 DIAGNOSIS — E1169 Type 2 diabetes mellitus with other specified complication: Secondary | ICD-10-CM | POA: Diagnosis not present

## 2021-02-06 LAB — COMPREHENSIVE METABOLIC PANEL
ALT: 75 IU/L — ABNORMAL HIGH (ref 0–32)
AST: 26 IU/L (ref 0–40)
Albumin/Globulin Ratio: 1.5 (ref 1.2–2.2)
Albumin: 4.4 g/dL (ref 3.8–4.9)
Alkaline Phosphatase: 123 IU/L — ABNORMAL HIGH (ref 44–121)
BUN/Creatinine Ratio: 21 (ref 9–23)
BUN: 15 mg/dL (ref 6–24)
Bilirubin Total: 0.4 mg/dL (ref 0.0–1.2)
CO2: 21 mmol/L (ref 20–29)
Calcium: 9.6 mg/dL (ref 8.7–10.2)
Chloride: 104 mmol/L (ref 96–106)
Creatinine, Ser: 0.7 mg/dL (ref 0.57–1.00)
Globulin, Total: 3 g/dL (ref 1.5–4.5)
Glucose: 109 mg/dL — ABNORMAL HIGH (ref 65–99)
Potassium: 5.2 mmol/L (ref 3.5–5.2)
Sodium: 142 mmol/L (ref 134–144)
Total Protein: 7.4 g/dL (ref 6.0–8.5)
eGFR: 101 mL/min/{1.73_m2} (ref >59)

## 2021-02-06 LAB — LIPID PANEL WITH LDL/HDL RATIO
Cholesterol, Total: 219 mg/dL — ABNORMAL HIGH (ref 100–199)
HDL: 45 mg/dL (ref >39)
LDL Chol Calc (NIH): 153 mg/dL — ABNORMAL HIGH (ref 0–99)
LDL/HDL Ratio: 3.4 ratio — ABNORMAL HIGH (ref 0.0–3.2)
Triglycerides: 115 mg/dL (ref 0–149)
VLDL Cholesterol Cal: 21 mg/dL (ref 5–40)

## 2021-02-06 LAB — HEMOGLOBIN A1C
Est. average glucose Bld gHb Est-mCnc: 128 mg/dL
Hgb A1c MFr Bld: 6.1 % — ABNORMAL HIGH (ref 4.8–5.6)

## 2021-02-06 LAB — VITAMIN D 25 HYDROXY (VIT D DEFICIENCY, FRACTURES): Vit D, 25-Hydroxy: 41 ng/mL (ref 30.0–100.0)

## 2021-02-06 LAB — INSULIN, RANDOM: INSULIN: 13.9 u[IU]/mL (ref 2.6–24.9)

## 2021-02-12 NOTE — Progress Notes (Signed)
Chief Complaint:   OBESITY Kelly Perez is here to discuss her progress with her obesity treatment plan along with follow-up of her obesity related diagnoses. Kelly Perez is on following a lower carbohydrate, vegetable and lean protein rich diet plan and states she is following her eating plan approximately 95% of the time. Kelly Perez states she is walking and gardening for 30-35 minutes 7 times per week.  Today's visit was #: 31 Starting weight: 372 lbs Starting date: 06/17/2020 Today's weight: 329 lbs Today's date: 02/05/2021 Total lbs lost to date: 43 Total lbs lost since last in-office visit: 5  Interim History: Kelly Perez continues to do very well with weight loss on her Lower carbohydrate plan. Her hunger is mostly controlled and she is doing well avoiding sweets.  Subjective:   1. Type 2 diabetes mellitus with other specified complication, without long-term current use of insulin (HCC) Kelly Perez is stable on metformin, but she still has some mild GI upset and she was unable to increase her metformin dose. She is doing very well with diet and weight loss.  2. Vitamin D deficiency Kelly Perez is stable on Vit D, and she is due to have labs checked.  3. Hyperlipidemia associated with type 2 diabetes mellitus (Kelly Perez) Kelly Perez is working on diet and weight loss, and she is due to have labs checked.  Assessment/Plan:   1. Type 2 diabetes mellitus with other specified complication, without long-term current use of insulin (HCC) Good blood sugar control is important to decrease the likelihood of diabetic complications such as nephropathy, neuropathy, limb loss, blindness, coronary artery disease, and death. Intensive lifestyle modification including diet, exercise and weight loss are the first line of treatment for diabetes.   - Comprehensive metabolic panel - Hemoglobin A1c - Insulin, random - Microalbumin / creatinine urine ratio  2. Vitamin D deficiency Low Vitamin D level contributes to fatigue and are  associated with obesity, breast, and colon cancer. We will check labs today. Kelly Perez will follow-up for routine testing of Vitamin D, at least 2-3 times per year to avoid over-replacement.  - VITAMIN D 25 Hydroxy (Vit-D Deficiency, Fractures)  3. Hyperlipidemia associated with type 2 diabetes mellitus (Kelly Perez) Cardiovascular risk and specific lipid/LDL goals reviewed. We discussed several lifestyle modifications today. We will check labs today. Kelly Perez will continue to work on diet, exercise and weight loss efforts. Orders and follow up as documented in patient record.   - Lipid Panel With LDL/HDL Ratio  4. Obesity with current BMI of 53.1 Kelly Perez is currently in the action stage of change. As such, her goal is to continue with weight loss efforts. She has agreed to following a lower carbohydrate, vegetable and lean protein rich diet plan.   Exercise goals: As is.  Behavioral modification strategies: keeping healthy foods in the home and holiday eating strategies .  Kelly Perez has agreed to follow-up with our clinic in 4 weeks. She was informed of the importance of frequent follow-up visits to maximize her success with intensive lifestyle modifications for her multiple health conditions.   Kelly Perez was informed we would discuss her lab results at her next visit unless there is a critical issue that needs to be addressed sooner. Kelly Perez agreed to keep her next visit at the agreed upon time to discuss these results.  Objective:   Blood pressure 129/79, pulse 68, temperature 98.5 F (36.9 C), height 5' 6"  (1.676 m), weight (!) 329 lb (149.2 kg), last menstrual period 02/13/2018, SpO2 97 %. Body mass index is 53.1 kg/m.  General: Cooperative, alert, well developed, in no acute distress. HEENT: Conjunctivae and lids unremarkable. Cardiovascular: Regular rhythm.  Lungs: Normal work of breathing. Neurologic: No focal deficits.   Lab Results  Component Value Date   CREATININE 0.70 02/05/2021   BUN 15  02/05/2021   NA 142 02/05/2021   K 5.2 02/05/2021   CL 104 02/05/2021   CO2 21 02/05/2021   Lab Results  Component Value Date   ALT 75 (H) 02/05/2021   AST 26 02/05/2021   ALKPHOS 123 (H) 02/05/2021   BILITOT 0.4 02/05/2021   Lab Results  Component Value Date   HGBA1C 6.1 (H) 02/05/2021   HGBA1C 6.5 (H) 09/29/2020   HGBA1C 6.4 (H) 06/17/2020   Lab Results  Component Value Date   INSULIN 13.9 02/05/2021   INSULIN 15.2 09/29/2020   INSULIN 27.4 (H) 06/17/2020   Lab Results  Component Value Date   TSH 2.690 06/17/2020   Lab Results  Component Value Date   CHOL 219 (H) 02/05/2021   HDL 45 02/05/2021   LDLCALC 153 (H) 02/05/2021   TRIG 115 02/05/2021   CHOLHDL 4 06/22/2018   Lab Results  Component Value Date   WBC 9.8 09/29/2020   HGB 14.3 09/29/2020   HCT 43.4 09/29/2020   MCV 84 09/29/2020   PLT 436 09/29/2020   Lab Results  Component Value Date   IRON 82 01/26/2018   TIBC 391 01/26/2018   FERRITIN 35 01/26/2018   Attestation Statements:   Reviewed by clinician on day of visit: allergies, medications, problem list, medical history, surgical history, family history, social history, and previous encounter notes.   I, Trixie Dredge, am acting as transcriptionist for Dennard Nip, MD.  I have reviewed the above documentation for accuracy and completeness, and I agree with the above. -  Dennard Nip, MD

## 2021-03-04 ENCOUNTER — Other Ambulatory Visit (INDEPENDENT_AMBULATORY_CARE_PROVIDER_SITE_OTHER): Payer: Self-pay | Admitting: Family Medicine

## 2021-03-04 DIAGNOSIS — E1169 Type 2 diabetes mellitus with other specified complication: Secondary | ICD-10-CM

## 2021-03-04 NOTE — Telephone Encounter (Signed)
Pt last seen by Dr. Leafy Ro.

## 2021-03-05 ENCOUNTER — Other Ambulatory Visit: Payer: Self-pay

## 2021-03-05 ENCOUNTER — Ambulatory Visit (INDEPENDENT_AMBULATORY_CARE_PROVIDER_SITE_OTHER): Payer: 59 | Admitting: Family Medicine

## 2021-03-05 ENCOUNTER — Encounter (INDEPENDENT_AMBULATORY_CARE_PROVIDER_SITE_OTHER): Payer: Self-pay | Admitting: Family Medicine

## 2021-03-05 VITALS — BP 114/72 | HR 58 | Temp 98.1°F | Ht 66.0 in | Wt 325.0 lb

## 2021-03-05 DIAGNOSIS — E118 Type 2 diabetes mellitus with unspecified complications: Secondary | ICD-10-CM

## 2021-03-05 DIAGNOSIS — E559 Vitamin D deficiency, unspecified: Secondary | ICD-10-CM

## 2021-03-05 DIAGNOSIS — R7401 Elevation of levels of liver transaminase levels: Secondary | ICD-10-CM | POA: Diagnosis not present

## 2021-03-05 DIAGNOSIS — Z9189 Other specified personal risk factors, not elsewhere classified: Secondary | ICD-10-CM | POA: Diagnosis not present

## 2021-03-05 DIAGNOSIS — E1169 Type 2 diabetes mellitus with other specified complication: Secondary | ICD-10-CM | POA: Diagnosis not present

## 2021-03-05 MED ORDER — VITAMIN D (ERGOCALCIFEROL) 1.25 MG (50000 UNIT) PO CAPS: 50000.0000 [IU] | ORAL_CAPSULE | ORAL | 0 refills | Status: DC

## 2021-03-05 NOTE — Telephone Encounter (Signed)
Will refill today 03/05/21 at patient's appt

## 2021-03-06 LAB — MICROALBUMIN / CREATININE URINE RATIO
Creatinine, Urine: 23.2 mg/dL
Microalb/Creat Ratio: 84 mg/g creat — ABNORMAL HIGH (ref 0–29)
Microalbumin, Urine: 19.6 ug/mL

## 2021-03-09 ENCOUNTER — Encounter: Payer: Self-pay | Admitting: Gastroenterology

## 2021-03-09 ENCOUNTER — Other Ambulatory Visit (INDEPENDENT_AMBULATORY_CARE_PROVIDER_SITE_OTHER): Payer: Self-pay | Admitting: Family Medicine

## 2021-03-09 DIAGNOSIS — E559 Vitamin D deficiency, unspecified: Secondary | ICD-10-CM

## 2021-03-09 NOTE — Progress Notes (Signed)
Chief Complaint:   OBESITY Kelly Perez is here to discuss her progress with her obesity treatment plan along with follow-up of her obesity related diagnoses. Kelly Perez is on following a lower carbohydrate, vegetable and lean protein rich diet plan and states she is following her eating plan approximately 95% of the time. Kelly Perez states she is walking for 35 minutes 7 times per week.  Today's visit was #: 14 Starting weight: 372 lbs Starting date: 06/17/2020 Today's weight: 325 lbs Today's date: 03/05/2021 Total lbs lost to date: 7 Total lbs lost since last in-office visit: 4  Interim History: Kelly Perez continues to do well with weight loss. She is on a lower simple carbohydrate plan, and decreased fatty meats but increased vegetables. Her hunger is controlled.  Subjective:   1. Elevated ALT measurement Kelly Perez is not on statin, and she denies abdominal pain or jaundice. Her ALT continue to be elevated despite weight loss. She is not being followed by a primary care provider currently, and she has a history of celiac disease. I discussed labs with the patient today.  2. Vitamin D deficiency Kelly Perez is not on Vit D prescription any longer, and her level has dropped below goal. She notes increased fatigue. I discussed labs with the patient today.  3. Type 2 diabetes mellitus with complications (Kelly Perez) Kelly Perez's A1c has improved, and she is doing well with diet. No MAB ratio has been done yet. I discussed labs with the patient today.  4. At risk for impaired function of liver Kelly Perez is at risk for impaired function of liver due to likely diagnosis of fatty liver as evidenced by recent elevated liver enzymes.   Assessment/Plan:   1. Elevated ALT measurement We will schedule an abdominal ultrasound. Kelly Perez agreed to continue with her weight loss efforts with healthier diet and exercise as an essential part of her treatment plan, and will follow up as directed.  - US Abdomen Complete; Future  2. Vitamin D  deficiency Low Vitamin D level contributes to fatigue and are associated with obesity, breast, and colon cancer. Kelly Perez agreed to restart prescription Vitamin D 50,000 IU every week with no refills. She will follow-up for routine testing of Vitamin D, at least 2-3 times per year to avoid over-replacement.  - Vitamin D, Ergocalciferol, (DRISDOL) 1.25 MG (50000 UNIT) CAPS capsule; Take 1 capsule (50,000 Units total) by mouth every 7 (seven) days.  Dispense: 4 capsule; Refill: 0  3. Type 2 diabetes mellitus with complications (HCC) We will check urine microalbumin today. Kelly Perez will continue with diet and weight loss. Good blood sugar control is important to decrease the likelihood of diabetic complications such as nephropathy, neuropathy, limb loss, blindness, coronary artery disease, and death. Intensive lifestyle modification including diet, exercise and weight loss are the first line of treatment for diabetes.   - Urine Microalbumin w/creat. ratio  4. At risk for impaired function of liver Kelly Perez was given approximately 15 minutes of counseling today regarding prevention of impaired liver function. Kelly Perez was educated about her risk of developing NASH or even liver failure and advised that the only proven treatment for NAFLD was weight loss of at least 5-10% of body weight.   5. Obesity with current BMI  52.6 Kelly Perez is currently in the action stage of change. As such, her goal is to continue with weight loss efforts. She has agreed to following a lower carbohydrate, vegetable and lean protein rich diet plan.   Exercise goals: As is.  Behavioral modification strategies: increasing  lean protein intake and meal planning and cooking strategies.  Kelly Perez has agreed to follow-up with our clinic in 4 weeks. She was informed of the importance of frequent follow-up visits to maximize her success with intensive lifestyle modifications for her multiple health conditions.   Kelly Perez was informed we would discuss  her lab results at her next visit unless there is a critical issue that needs to be addressed sooner. Kelly Perez agreed to keep her next visit at the agreed upon time to discuss these results.  Objective:   Blood pressure 114/72, pulse (!) 58, temperature 98.1 F (36.7 C), height 5' 6"  (1.676 m), weight (!) 325 lb (147.4 kg), last menstrual period 02/13/2018, SpO2 96 %. Body mass index is 52.46 kg/m.  General: Cooperative, alert, well developed, in no acute distress. HEENT: Conjunctivae and lids unremarkable. Cardiovascular: Regular rhythm.  Lungs: Normal work of breathing. Neurologic: No focal deficits.   Lab Results  Component Value Date   CREATININE 0.70 02/05/2021   BUN 15 02/05/2021   NA 142 02/05/2021   K 5.2 02/05/2021   CL 104 02/05/2021   CO2 21 02/05/2021   Lab Results  Component Value Date   ALT 75 (H) 02/05/2021   AST 26 02/05/2021   ALKPHOS 123 (H) 02/05/2021   BILITOT 0.4 02/05/2021   Lab Results  Component Value Date   HGBA1C 6.1 (H) 02/05/2021   HGBA1C 6.5 (H) 09/29/2020   HGBA1C 6.4 (H) 06/17/2020   Lab Results  Component Value Date   INSULIN 13.9 02/05/2021   INSULIN 15.2 09/29/2020   INSULIN 27.4 (H) 06/17/2020   Lab Results  Component Value Date   TSH 2.690 06/17/2020   Lab Results  Component Value Date   CHOL 219 (H) 02/05/2021   HDL 45 02/05/2021   LDLCALC 153 (H) 02/05/2021   TRIG 115 02/05/2021   CHOLHDL 4 06/22/2018   Lab Results  Component Value Date   WBC 9.8 09/29/2020   HGB 14.3 09/29/2020   HCT 43.4 09/29/2020   MCV 84 09/29/2020   PLT 436 09/29/2020   Lab Results  Component Value Date   IRON 82 01/26/2018   TIBC 391 01/26/2018   FERRITIN 35 01/26/2018   Attestation Statements:   Reviewed by clinician on day of visit: allergies, medications, problem list, medical history, surgical history, family history, social history, and previous encounter notes.   I, Trixie Dredge, am acting as transcriptionist for Dennard Nip,  MD.  I have reviewed the above documentation for accuracy and completeness, and I agree with the above. -  Dennard Nip, MD

## 2021-03-09 NOTE — Telephone Encounter (Signed)
Pt last seen by Dr. Leafy Ro.

## 2021-03-24 ENCOUNTER — Ambulatory Visit
Admission: RE | Admit: 2021-03-24 | Discharge: 2021-03-24 | Disposition: A | Discharge status: Home / Self Care | Payer: 59 | Source: Ambulatory Visit | Attending: Family Medicine | Admitting: Family Medicine

## 2021-03-24 DIAGNOSIS — R7401 Elevation of levels of liver transaminase levels: Secondary | ICD-10-CM

## 2021-04-05 ENCOUNTER — Other Ambulatory Visit: Payer: Self-pay

## 2021-04-06 ENCOUNTER — Ambulatory Visit (INDEPENDENT_AMBULATORY_CARE_PROVIDER_SITE_OTHER): Payer: 59 | Admitting: Family Medicine

## 2021-04-06 ENCOUNTER — Encounter (INDEPENDENT_AMBULATORY_CARE_PROVIDER_SITE_OTHER): Payer: Self-pay | Admitting: Family Medicine

## 2021-04-06 VITALS — BP 132/81 | HR 60 | Temp 98.0°F | Ht 66.0 in | Wt 322.0 lb

## 2021-04-06 DIAGNOSIS — Z6841 Body Mass Index (BMI) 40.0 and over, adult: Secondary | ICD-10-CM

## 2021-04-06 DIAGNOSIS — E1169 Type 2 diabetes mellitus with other specified complication: Secondary | ICD-10-CM | POA: Diagnosis not present

## 2021-04-06 DIAGNOSIS — E559 Vitamin D deficiency, unspecified: Secondary | ICD-10-CM | POA: Diagnosis not present

## 2021-04-06 DIAGNOSIS — Z9189 Other specified personal risk factors, not elsewhere classified: Secondary | ICD-10-CM

## 2021-04-06 DIAGNOSIS — K760 Fatty (change of) liver, not elsewhere classified: Secondary | ICD-10-CM

## 2021-04-06 MED ORDER — VITAMIN D (ERGOCALCIFEROL) 1.25 MG (50000 UNIT) PO CAPS
50000.0000 [IU] | ORAL_CAPSULE | ORAL | 0 refills | Status: DC
Start: 2021-04-06 — End: 2021-05-05

## 2021-04-06 MED ORDER — METFORMIN HCL 500 MG PO TABS: 500.0000 mg | ORAL_TABLET | Freq: Every day | ORAL | 0 refills | Status: DC

## 2021-04-13 NOTE — Progress Notes (Signed)
Chief Complaint:   OBESITY Kelly Perez is here to discuss her progress with her obesity treatment plan along with follow-up of her obesity related diagnoses. Kelly Perez is on following a lower carbohydrate, vegetable and lean protein rich diet plan and states she is following her eating plan approximately 95% of the time. Kelly Perez states she is walking for 30-35 minutes 7 times per week.  Today's visit was #: 15 Starting weight: 372 lbs Starting date: 06/17/2020 Today's weight: 322 lbs Today's date: 04/06/2021 Total lbs lost to date: 50 Total lbs lost since last in-office visit: 3  Interim History: Kelly Perez continues to do well with weight loss. She is working on following a lower carbohydrate plan. She is exercising most days and trying to increase hydration as there weather gets hotter.   Subjective:   1. Type 2 diabetes mellitus with other specified complication, without long-term current use of insulin ([place]) Phylisha is stable on metformin, and she is doing very well with weight loss. She denies signs of hypoglycemia.  2. NAFLD (nonalcoholic fatty liver disease) Kelly Perez had her abdominal ultrasound, which shows some fatty liver. She has a GI appointment to look at this, and her celiac disease. She continues to do well with weight loss.  3. Vitamin D deficiency Kelly Perez is stable on Vit D, and she denies nausea, vomiting, or muscle weakness. Last vit D level was not at goal.  4. At risk for dehydration Kelly Perez is at risk for dehydration due to inadequate water intake.  Assessment/Plan:   1. Type 2 diabetes mellitus with other specified complication, without long-term current use of insulin (HCC) We will refill metformin for 1 month. Aniayah will continue with diet and exercise. Good blood sugar control is important to decrease the likelihood of diabetic complications such as nephropathy, neuropathy, limb loss, blindness, coronary artery disease, and death. Intensive lifestyle modification including diet,  exercise and weight loss are the first line of treatment for diabetes.   - metFORMIN (GLUCOPHAGE) 500 MG tablet; Take 1 tablet (500 mg total) by mouth daily after lunch.  Dispense: 30 tablet; Refill: 0  2. NAFLD (nonalcoholic fatty liver disease) We discussed the likely diagnosis of non-alcoholic fatty liver disease today and how this condition is obesity related. Kelly Perez was educated the importance of weight loss. Kelly Perez is to continue her diet and exercise, and will continue to monitor. She may benefit from a GLP-1 although she is doing well with weight loss so far.   3. Vitamin D deficiency Low Vitamin D level contributes to fatigue and are associated with obesity, breast, and colon cancer. We will refill prescription Vitamin D for 1 month Kelly Perez will follow-up for routine testing of Vitamin D, at least 2-3 times per year to avoid over-replacement.  - Vitamin D, Ergocalciferol, (DRISDOL) 1.25 MG (50000 UNIT) CAPS capsule; Take 1 capsule (50,000 Units total) by mouth every 7 (seven) days.  Dispense: 4 capsule; Refill: 0  4. At risk for dehydration Kelly Perez was given approximately 15 minutes dehydration prevention counseling today. Kelly Perez is at risk for dehydration due to weight loss and current medication(s). She was encouraged to hydrate and monitor fluid status to avoid dehydration as well as weight loss plateaus.   5. Obesity with current BMI 52.0 Kelly Perez is currently in the action stage of change. As such, her goal is to continue with weight loss efforts. She has agreed to following a lower carbohydrate, vegetable and lean protein rich diet plan.   Exercise goals: As is.  Behavioral modification  strategies: increasing lean protein intake and increasing water intake.  Kelly Perez has agreed to follow-up with our clinic in 4 weeks. She was informed of the importance of frequent follow-up visits to maximize her success with intensive lifestyle modifications for her multiple health conditions.   Objective:    Blood pressure 132/81, pulse 60, temperature 98 F (36.7 C), height 5' 6"  (1.676 m), weight (!) 322 lb (146.1 kg), last menstrual period 02/13/2018, SpO2 97 %. Body mass index is 51.97 kg/m.  General: Cooperative, alert, well developed, in no acute distress. HEENT: Conjunctivae and lids unremarkable. Cardiovascular: Regular rhythm.  Lungs: Normal work of breathing. Neurologic: No focal deficits.   Lab Results  Component Value Date   CREATININE 0.70 02/05/2021   BUN 15 02/05/2021   NA 142 02/05/2021   K 5.2 02/05/2021   CL 104 02/05/2021   CO2 21 02/05/2021   Lab Results  Component Value Date   ALT 75 (H) 02/05/2021   AST 26 02/05/2021   ALKPHOS 123 (H) 02/05/2021   BILITOT 0.4 02/05/2021   Lab Results  Component Value Date   HGBA1C 6.1 (H) 02/05/2021   HGBA1C 6.5 (H) 09/29/2020   HGBA1C 6.4 (H) 06/17/2020   Lab Results  Component Value Date   INSULIN 13.9 02/05/2021   INSULIN 15.2 09/29/2020   INSULIN 27.4 (H) 06/17/2020   Lab Results  Component Value Date   TSH 2.690 06/17/2020   Lab Results  Component Value Date   CHOL 219 (H) 02/05/2021   HDL 45 02/05/2021   LDLCALC 153 (H) 02/05/2021   TRIG 115 02/05/2021   CHOLHDL 4 06/22/2018   Lab Results  Component Value Date   WBC 9.8 09/29/2020   HGB 14.3 09/29/2020   HCT 43.4 09/29/2020   MCV 84 09/29/2020   PLT 436 09/29/2020   Lab Results  Component Value Date   IRON 82 01/26/2018   TIBC 391 01/26/2018   FERRITIN 35 01/26/2018   Attestation Statements:   Reviewed by clinician on day of visit: allergies, medications, problem list, medical history, surgical history, family history, social history, and previous encounter notes.   I, Trixie Dredge, am acting as transcriptionist for Dennard Nip, MD.  I have reviewed the above documentation for accuracy and completeness, and I agree with the above. -  Dennard Nip, MD

## 2021-04-14 ENCOUNTER — Encounter: Payer: Self-pay | Admitting: Gastroenterology

## 2021-04-14 ENCOUNTER — Ambulatory Visit (INDEPENDENT_AMBULATORY_CARE_PROVIDER_SITE_OTHER): Payer: 59 | Admitting: Gastroenterology

## 2021-04-14 ENCOUNTER — Other Ambulatory Visit: Payer: 59

## 2021-04-14 VITALS — BP 134/82 | HR 76 | Ht 66.0 in | Wt 325.8 lb

## 2021-04-14 DIAGNOSIS — K9089 Other intestinal malabsorption: Secondary | ICD-10-CM

## 2021-04-14 DIAGNOSIS — R748 Abnormal levels of other serum enzymes: Secondary | ICD-10-CM | POA: Diagnosis not present

## 2021-04-14 DIAGNOSIS — K760 Fatty (change of) liver, not elsewhere classified: Secondary | ICD-10-CM

## 2021-04-14 NOTE — Progress Notes (Signed)
Referring Provider: Esaw Grandchild, NP Primary Care Physician:  Leamon Arnt, MD  Reason for Consultation:  Abnormal liver enzymes   IMPRESSION:  Abnormal ALT Fatty liver on ultrasound Celiac disease    - diagnosed in 2013 in [place] Cholecystectomy for gallstones 2015 Colonoscopy 2014 in [place]   Celiac Disease: Obtain prior records regarding diagnosis. Lifelong adherence to a gluten-free diet remains the maintain of treatment. TTGA and IgA recommended to monitor the response to gluten-free diet. DEXA to evaluate for associated bone disease is recommended, as is pneumococcal vaccination.   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 Recent blood donation negative for chronic viral hepatitis. Labs today to screen for concurrent liver disease, particularly autoimmune hepatitis in the setting of celiac disease. FibroSure for liver staging. Will likely also benefit from elastography. Waiting to make that decision because she may warrant a liver biopsy if autoimmune serologies are positive.   PLAN: - Obtain prior records from [place] - Pneumococcal vaccine - DEXA scan - FibroSURE - TTGA, IgA - ANA, IgM, AMA, IgG, smooth muscle antibody - Follow-up in 2-3 months, earlier as need    PLAN: Obtain records from [facility] re: endoscopy and pathology results  Please see the "Patient Instructions" section for addition details about the plan.  HPI: Kelly Perez is a 55 y.o. female referred by Dr. Loleta Books for for further evaluation of celiac disease and abnormal liver enzymes. The history is obtained through the patient and review of her electronic health record. She has obesity and is followed at the [facility], vitamin d deficiency, and type 2 diabetes. She has a distant history of H pylori. She had a cholecystectomy for gallstones in 2015.  She was diagnosed with celiac in 2012 during  the evaluation of frequent episodes of nausea, joint pain and rash. She follows a strict gluten free diet to manage her symptoms. No follow-up celiac labs have been performed.  No prior bone density testing. No prior pneumovax. Diagnosed in 2013 at [facility]. She reports have a colonoscopy in 2014. No constipation on a gluten free diet.   While receiving regular care with the [facility], she was found to have an elevated ALT. It was 14 in 2018, 28 in 2019, 49 in 2021, and most recently 66. Her other liver enzymes are normal except for a mildly elevated alk phos at 123. ALT remains elevated despite a 50 pound intentional weight loss.   Evaluation of abnormal liver enzymes includes: - Abdominal ultrasound 03/24/21: echogenic liver, prior cholecystectomy - HCV antibody negative 2019  Blood donation in 2018.  No prior blood transfusion.  No history of jaundice or scleral icterus.  No history of use or experimentation with IV or intranasal street drugs.  No history of autoimmune disease.    No family history of liver disease. No known family history of colon cancer or polyps. No family history of uterine/endometrial cancer, pancreatic cancer or gastric/stomach cancer.   Past Medical History:  Diagnosis Date   Allergy    Anemia    Anxiety    Arthritis    B12 deficiency    Bilateral primary osteoarthritis of knee 01/26/2018   Celiac disease 01/26/2018   Constipation    Depression    Food allergy    Gallbladder problem    GERD (gastroesophageal reflux disease)    Hypertension    Morbid obesity ([place]) 01/26/2018  No energy    Osteoarthritis    PTSD (post-traumatic stress disorder)    Sleep apnea    Vitamin B12 deficiency    due to celiac   Vitamin D deficiency    Vitamin D deficiency     Past Surgical History:  Procedure Laterality Date   CHOLECYSTECTOMY  2015   SINUS IRRIGATION      Current Outpatient Medications  Medication Sig Dispense  Refill   bisoprolol (ZEBETA) 10 MG tablet TAKE 1 TABLET DAILY 90 tablet 3   cetirizine (ZYRTEC) 10 MG tablet Take 10 mg by mouth daily.     Digestive Enzymes (SUPER ENZYMES PO) Take 1 tablet by mouth daily.     Ferrous Sulfate (IRON) 325 (65 Fe) MG TABS Take 1 tablet by mouth daily.     Magnesium 400 MG TABS Take 1 tablet by mouth daily.     metFORMIN (GLUCOPHAGE) 500 MG tablet Take 1 tablet (500 mg total) by mouth daily after lunch. 30 tablet 0   TURMERIC PO Take 1 tablet by mouth daily.     vitamin B-12 (CYANOCOBALAMIN) 1000 MCG tablet Take 3,000 mcg by mouth daily.      Vitamin D, Ergocalciferol, (DRISDOL) 1.25 MG (50000 UNIT) CAPS capsule Take 1 capsule (50,000 Units total) by mouth every 7 (seven) days. 4 capsule 0   Zinc 50 MG TABS Take 1 tablet by mouth daily.     No current facility-administered medications for this visit.    Allergies as of 04/14/2021 - Review Complete 04/14/2021  Allergen Reaction Noted   Neosporin [neomycin-bacitracin zn-polymyx] Rash 01/26/2018   Tetracycline Anaphylaxis 12/01/2017   Neomycin  06/22/2018   Other  12/01/2017    Family History  Problem Relation Age of Onset   Bipolar disorder Mother    Diabetes Mother    Hypertension Mother    Heart disease Mother    Depression Mother    Obesity Mother    COPD Father    Depression Father    Early death Father    Heart disease Father    Kidney disease Father    Cardiomyopathy Father    Arthritis Father    Hypertension Father    Bipolar disorder Sister    Diabetes Brother    Heart disease Brother    Diabetes Maternal Grandmother    Bipolar disorder Maternal Grandfather    Diabetes Paternal Grandmother    Diabetes Paternal Grandfather    Hypertension Daughter    Depression Daughter    Diabetes Daughter    Rectal cancer Maternal Aunt    Stomach cancer Neg Hx    Liver disease Neg Hx    Colon cancer Neg Hx    Pancreatic cancer Neg Hx    Esophageal cancer Neg Hx     Social History    Socioeconomic History   Marital status: Divorced    Spouse name: Not on file   Number of children: 1   Years of education: Not on file   Highest education level: Not on file  Occupational History   Occupation: Engineer, manufacturing: [employer]   Occupation: former Development worker, 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   Lives alone with great Dane. Divorced. No tob or Etoh   Social Determinants of Radio broadcast assistant Strain: Not on file  Food Insecurity: Not on file  Transportation Needs: Not on file  Physical Activity: Not on file  Stress: Not on file  Social Connections: Not on file  Intimate Partner Violence: Not on file    Review of Systems: 12 system ROS is negative except as noted above except for anxiety, arthritis, back pain, depression, and fatigue.   Physical Exam: General:   Alert,  well-nourished, pleasant and cooperative in NAD Head:  Normocephalic and atraumatic. Eyes:  Sclera clear, no icterus.   Conjunctiva pink. Ears:  Normal auditory acuity. Nose:  No deformity, discharge,  or lesions. Mouth:  No deformity or lesions.   Neck:  Supple; no masses or thyromegaly. Lungs:  Clear throughout to auscultation.   No wheezes. Heart:  Regular rate and rhythm; no murmurs. Abdomen:  Soft, central obesity, nontender, nondistended, normal bowel sounds, no rebound or guarding. No hepatosplenomegaly.   Rectal:  Deferred  Msk:  Symmetrical. No boney deformities LAD: No inguinal or umbilical LAD Extremities:  No clubbing or edema. Neurologic:  Alert and  oriented x4;  grossly  nonfocal Skin:  Intact without significant lesions or rashes. No palmar erythema or spider angioma.  Psych:  Alert and cooperative. Normal mood and affect.     Jasara Corrigan L. Tarri Glenn, MD, MPH 04/14/2021, 2:47 PM

## 2021-04-14 NOTE — Patient Instructions (Addendum)
It was my pleasure to provide care to you you today. Based on our discussion, I am providing you with my recommendations below:  RECOMMENDATION(S):   Please take psyllium daily  LABS:   Please proceed to the basement level for lab work before leaving today. Press "B" on the elevator. The lab is located at the first door on the left as you exit the elevator.  HEALTHCARE LAWS AND MY CHART RESULTS:   Due to recent changes in healthcare laws, you may see results of your imaging and/or laboratory studies on MyChart before I have had a chance to review them.  I understand that in some cases there may be results that are confusing or concerning to you. Please understand that not all results are received at same time and often I may need to interpret multiple results in order to provide you with the best plan of care or course of treatment. Therefore, I ask that you please give me 48 hours to thoroughly review all your results before contacting my office for clarification.   FOLLOW UP:  I would like for you to follow up with me in 3 months. Please refer to your appointments contained within this After Visit Summary.  BMI:  If you are age 20 or younger, your body mass index should be between 19-25. Your There is no height or weight on file to calculate BMI. If this is out of the aformentioned range listed, please consider follow up with your Primary Care Provider.   MY CHART:  The [facility] providers would like to encourage you to use [facility] to communicate with providers for non-urgent requests or questions.  Due to long hold times on the telephone, sending your provider a message by [facility] may be a faster and more efficient way to get a response.  Please allow 48 business hours for a response.  Please remember that this is for non-urgent requests.   Thank you for trusting me with your gastrointestinal care!    Thornton Park, MD, MPH

## 2021-04-16 LAB — NASH FIBROSURE
ALPHA 2-MACROGLOBULINS, QN: 139 mg/dL (ref 110–276)
ALT (SGPT) P5P: 23 IU/L (ref 0–40)
AST (SGOT) P5P: 14 IU/L (ref 0–40)
Apolipoprotein A-1: 131 mg/dL (ref 116–209)
Bilirubin, Total: 0.2 mg/dL (ref 0.0–1.2)
Cholesterol, Total: 239 mg/dL — ABNORMAL HIGH (ref 100–199)
Fibrosis Score: 0.05 (ref 0.00–0.21)
GGT: 28 IU/L (ref 0–60)
Glucose: 96 mg/dL (ref 65–99)
Haptoglobin: 207 mg/dL (ref 33–346)
Height: 66 in
NASH Score: 0.5 — ABNORMAL HIGH
Steatosis Score: 0.72 — ABNORMAL HIGH (ref 0.00–0.30)
Triglycerides: 157 mg/dL — ABNORMAL HIGH (ref 0–149)
Weight: 325 [lb_av]

## 2021-04-17 LAB — ANTI-SMOOTH MUSCLE ANTIBODY, IGG: Actin (Smooth Muscle) Antibody (IGG): <20 U (ref <20)

## 2021-04-17 LAB — ANTI-NUCLEAR AB-TITER (ANA TITER): ANA Titer 1: 1:80 {titer} — ABNORMAL HIGH

## 2021-04-17 LAB — TISSUE TRANSGLUTAMINASE, IGA: (tTG) Ab, IgA: 1.9 U/mL

## 2021-04-17 LAB — IGA: Immunoglobulin A: 129 mg/dL (ref 47–310)

## 2021-04-17 LAB — ANA: Anti Nuclear Antibody (ANA): POSITIVE — AB

## 2021-04-17 LAB — HEPATITIS B CORE ANTIBODY, IGM: Hep B C IgM: NONREACTIVE

## 2021-04-17 LAB — MITOCHONDRIAL ANTIBODIES: Mitochondrial M2 Ab, IgG: <=20 U

## 2021-04-22 ENCOUNTER — Other Ambulatory Visit: Payer: Self-pay | Admitting: *Deleted

## 2021-04-22 DIAGNOSIS — R748 Abnormal levels of other serum enzymes: Secondary | ICD-10-CM

## 2021-04-23 ENCOUNTER — Other Ambulatory Visit: Payer: 59

## 2021-04-23 DIAGNOSIS — R748 Abnormal levels of other serum enzymes: Secondary | ICD-10-CM

## 2021-04-24 LAB — IGM: IgM, Serum: 35 mg/dL — ABNORMAL LOW (ref 50–300)

## 2021-04-24 LAB — IGG: IgG (Immunoglobin G), Serum: 1338 mg/dL (ref 600–1640)

## 2021-04-30 ENCOUNTER — Other Ambulatory Visit (INDEPENDENT_AMBULATORY_CARE_PROVIDER_SITE_OTHER): Payer: Self-pay | Admitting: Family Medicine

## 2021-04-30 DIAGNOSIS — E1169 Type 2 diabetes mellitus with other specified complication: Secondary | ICD-10-CM

## 2021-04-30 NOTE — Telephone Encounter (Signed)
Pt last seen by Dr. Leafy Ro.

## 2021-04-30 NOTE — Telephone Encounter (Signed)
Patient is requesting a refill of the following medications: Requested Prescriptions   Pending Prescriptions Disp Refills   metFORMIN (GLUCOPHAGE) 500 MG tablet [Pharmacy Med Name: METFORMIN HCL 500 MG TABLET] 30 tablet 0    Sig: TAKE 1 TABLET BY MOUTH DAILY AFTER LUNCH.    Last office visit: 04/06/21 Date of last refill: 04/06/21 Last refill amount: 30 Follow up time period per chart: 4 week Next appt:05/05/21

## 2021-05-05 ENCOUNTER — Encounter (INDEPENDENT_AMBULATORY_CARE_PROVIDER_SITE_OTHER): Payer: Self-pay | Admitting: Family Medicine

## 2021-05-05 ENCOUNTER — Ambulatory Visit (INDEPENDENT_AMBULATORY_CARE_PROVIDER_SITE_OTHER): Payer: 59 | Admitting: Family Medicine

## 2021-05-05 ENCOUNTER — Other Ambulatory Visit: Payer: Self-pay

## 2021-05-05 VITALS — BP 122/79 | HR 66 | Temp 98.2°F | Ht 66.0 in | Wt 323.0 lb

## 2021-05-05 DIAGNOSIS — Z6841 Body Mass Index (BMI) 40.0 and over, adult: Secondary | ICD-10-CM | POA: Diagnosis not present

## 2021-05-05 DIAGNOSIS — E1169 Type 2 diabetes mellitus with other specified complication: Secondary | ICD-10-CM

## 2021-05-05 DIAGNOSIS — E559 Vitamin D deficiency, unspecified: Secondary | ICD-10-CM | POA: Diagnosis not present

## 2021-05-05 DIAGNOSIS — Z9189 Other specified personal risk factors, not elsewhere classified: Secondary | ICD-10-CM

## 2021-05-05 DIAGNOSIS — R7303 Prediabetes: Secondary | ICD-10-CM

## 2021-05-05 MED ORDER — METFORMIN HCL 500 MG PO TABS: 500.0000 mg | ORAL_TABLET | Freq: Every day | ORAL | 0 refills | Status: DC

## 2021-05-05 MED ORDER — VITAMIN D (ERGOCALCIFEROL) 1.25 MG (50000 UNIT) PO CAPS: 50000.0000 [IU] | ORAL_CAPSULE | ORAL | 0 refills | Status: DC

## 2021-05-12 NOTE — Progress Notes (Signed)
Chief Complaint:   OBESITY Kelly Perez is here to discuss her progress with her obesity treatment plan along with follow-up of her obesity related diagnoses. Shaylyn is on following a lower carbohydrate, vegetable and lean protein rich diet plan and states she is following her eating plan approximately 95% of the time. Kalleigh states she is walking for 20 minutes 7 times per week.  Today's visit was #: 66 Starting weight: 372 lbs Starting date: 06/17/2020 Today's weight: 323 lbs Today's date: 05/05/2021 Total lbs lost to date: 49 Total lbs lost since last in-office visit: 0  Interim History: Kelly Perez is retaining fluid today. She is working on increasing hydration and trying to stay active. Her hunger is mostly controlled. She is tolerating her Low carbohydrate plan well overall.  Subjective:   1. Vitamin D deficiency Kelly Perez is stable on Vit D, and she denies signs of over-replacement.  2. Type 2 diabetes mellitus with other specified complication, without long-term current use of insulin (HCC) Kelly Perez continues to work on diet, exercise, and weight loss. She is stable on metformin with minimal GI upset when she takes it with lunch.  3. At risk for dehydration Starr is at risk for dehydration due to inadequate water intake.  Assessment/Plan:   1. Vitamin D deficiency Low Vitamin D level contributes to fatigue and are associated with obesity, breast, and colon cancer. We will refill prescription Vitamin D for 1 month and we will recheck labs in 1 month. Jashawna will follow-up for routine testing of Vitamin D, at least 2-3 times per year to avoid over-replacement.  - Vitamin D, Ergocalciferol, (DRISDOL) 1.25 MG (50000 UNIT) CAPS capsule; Take 1 capsule (50,000 Units total) by mouth every 7 (seven) days.  Dispense: 4 capsule; Refill: 0  2. Type 2 diabetes mellitus with other specified complication, without long-term current use of insulin (HCC) Kelly Perez will continue her medications, and we will  refill metformin for 1 month. We will recheck labs in 1 month. Good blood sugar control is important to decrease the likelihood of diabetic complications such as nephropathy, neuropathy, limb loss, blindness, coronary artery disease, and death. Intensive lifestyle modification including diet, exercise and weight loss are the first line of treatment for diabetes.   - metFORMIN (GLUCOPHAGE) 500 MG tablet; Take 1 tablet (500 mg total) by mouth daily after lunch.  Dispense: 30 tablet; Refill: 0  3. At risk for dehydration Kelly Perez was given approximately 15 minutes dehydration prevention counseling today. Kelly Perez is at risk for dehydration due to weight loss and current medication(s). She was encouraged to hydrate and monitor fluid status to avoid dehydration as well as weight loss plateaus.   4. Obesity with current BMI  52.2 Kelly Perez is currently in the action stage of change. As such, her goal is to continue with weight loss efforts. She has agreed to following a lower carbohydrate, vegetable and lean protein rich diet plan.   We will recheck fasting labs at her next visit.  Exercise goals: As is.  Behavioral modification strategies: increasing lean protein intake and increasing water intake.  Kelly Perez has agreed to follow-up with our clinic in 4 weeks. She was informed of the importance of frequent follow-up visits to maximize her success with intensive lifestyle modifications for her multiple health conditions.   Objective:   Blood pressure 122/79, pulse 66, temperature 98.2 F (36.8 C), height 5' 6"  (1.676 m), weight (!) 323 lb (146.5 kg), last menstrual period 02/13/2018, SpO2 96 %. Body mass index is 52.13 kg/m.  General: Cooperative, alert, well developed, in no acute distress. HEENT: Conjunctivae and lids unremarkable. Cardiovascular: Regular rhythm.  Lungs: Normal work of breathing. Neurologic: No focal deficits.   Lab Results  Component Value Date   CREATININE 0.70 02/05/2021   BUN 15  02/05/2021   NA 142 02/05/2021   K 5.2 02/05/2021   CL 104 02/05/2021   CO2 21 02/05/2021   Lab Results  Component Value Date   ALT 75 (H) 02/05/2021   AST 26 02/05/2021   ALKPHOS 123 (H) 02/05/2021   BILITOT 0.4 02/05/2021   Lab Results  Component Value Date   HGBA1C 6.1 (H) 02/05/2021   HGBA1C 6.5 (H) 09/29/2020   HGBA1C 6.4 (H) 06/17/2020   Lab Results  Component Value Date   INSULIN 13.9 02/05/2021   INSULIN 15.2 09/29/2020   INSULIN 27.4 (H) 06/17/2020   Lab Results  Component Value Date   TSH 2.690 06/17/2020   Lab Results  Component Value Date   CHOL 239 (H) 04/14/2021   HDL 45 02/05/2021   LDLCALC 153 (H) 02/05/2021   TRIG 157 (H) 04/14/2021   CHOLHDL 4 06/22/2018   Lab Results  Component Value Date   VD25OH 41.0 02/05/2021   VD25OH 60.8 09/29/2020   VD25OH 39.5 06/17/2020   Lab Results  Component Value Date   WBC 9.8 09/29/2020   HGB 14.3 09/29/2020   HCT 43.4 09/29/2020   MCV 84 09/29/2020   PLT 436 09/29/2020   Lab Results  Component Value Date   IRON 82 01/26/2018   TIBC 391 01/26/2018   FERRITIN 35 01/26/2018   Attestation Statements:   Reviewed by clinician on day of visit: allergies, medications, problem list, medical history, surgical history, family history, social history, and previous encounter notes.   I, Trixie Dredge, am acting as transcriptionist for Dennard Nip, MD.  I have reviewed the above documentation for accuracy and completeness, and I agree with the above. -  Dennard Nip, MD

## 2021-05-29 ENCOUNTER — Other Ambulatory Visit (INDEPENDENT_AMBULATORY_CARE_PROVIDER_SITE_OTHER): Payer: Self-pay | Admitting: Family Medicine

## 2021-05-29 DIAGNOSIS — E1169 Type 2 diabetes mellitus with other specified complication: Secondary | ICD-10-CM

## 2021-05-30 ENCOUNTER — Other Ambulatory Visit (INDEPENDENT_AMBULATORY_CARE_PROVIDER_SITE_OTHER): Payer: Self-pay | Admitting: Family Medicine

## 2021-05-30 DIAGNOSIS — E559 Vitamin D deficiency, unspecified: Secondary | ICD-10-CM

## 2021-06-01 ENCOUNTER — Encounter (INDEPENDENT_AMBULATORY_CARE_PROVIDER_SITE_OTHER): Payer: Self-pay

## 2021-06-01 NOTE — Telephone Encounter (Signed)
Last OV with Dr. Beasley 

## 2021-06-01 NOTE — Telephone Encounter (Signed)
LAST APPOINTMENT DATE: 05/05/21 NEXT APPOINTMENT DATE: 06/11/21   [id number] [place], [place] - [address] [place] [address]Phone: [phone]: [phone] [facility] [place]Phone: [phone]: [phone] [place]- [address]Phone: [phone]: [phone] Patient is requesting a refill of the following medications: Pending Prescriptions:                       Disp   Refills   Vitamin D, Ergocalciferol, (DRISDOL) 1.25 *4 caps*0       Sig: Take 1 capsule (50,000 Units total) by mouth every 7          (seven) days.   Date last filled: 05/05/21 Previously prescribed by Dr BLeafy Ro Lab Results      Component                Value               Date                      HGBA1C                   6.1 (H)             02/05/2021                HGBA1C                   6.5 (H)             09/29/2020                HGBA1C                   6.4 (H)             06/17/2020           Lab Results      Component                Value               Date                      LDLCALC                  153 (H)             02/05/2021                CREATININE               0.70                02/05/2021           Lab Results      Component                Value               Date                      VD25OH  41.0                02/05/2021                VD25OH                   60.8                09/29/2020                VD25OH                   39.5                06/17/2020            BP Readings from Last 3 Encounters: 05/05/21 : 122/79 04/14/21 : 134/82 04/06/21 : 132/81

## 2021-06-01 NOTE — Telephone Encounter (Signed)
Message sent to pt-CAS 

## 2021-06-08 NOTE — Telephone Encounter (Signed)
I will attempted to obtain a paper copy of the 105 pages, as I am afraid I might not be able to fully review and summarize those records electronically without missing something.

## 2021-06-11 ENCOUNTER — Encounter (INDEPENDENT_AMBULATORY_CARE_PROVIDER_SITE_OTHER): Payer: Self-pay | Admitting: Family Medicine

## 2021-06-11 ENCOUNTER — Ambulatory Visit (INDEPENDENT_AMBULATORY_CARE_PROVIDER_SITE_OTHER): Payer: 59 | Admitting: Family Medicine

## 2021-06-11 ENCOUNTER — Other Ambulatory Visit: Payer: Self-pay

## 2021-06-11 VITALS — BP 116/75 | HR 64 | Temp 97.7°F | Ht 66.0 in | Wt 322.0 lb

## 2021-06-11 DIAGNOSIS — E559 Vitamin D deficiency, unspecified: Secondary | ICD-10-CM

## 2021-06-11 DIAGNOSIS — E1169 Type 2 diabetes mellitus with other specified complication: Secondary | ICD-10-CM | POA: Diagnosis not present

## 2021-06-11 DIAGNOSIS — E7849 Other hyperlipidemia: Secondary | ICD-10-CM | POA: Diagnosis not present

## 2021-06-11 DIAGNOSIS — R899 Unspecified abnormal finding in specimens from other organs, systems and tissues: Secondary | ICD-10-CM

## 2021-06-11 DIAGNOSIS — Z6841 Body Mass Index (BMI) 40.0 and over, adult: Secondary | ICD-10-CM

## 2021-06-11 DIAGNOSIS — Z9189 Other specified personal risk factors, not elsewhere classified: Secondary | ICD-10-CM | POA: Diagnosis not present

## 2021-06-11 MED ORDER — METFORMIN HCL 500 MG PO TABS: 500.0000 mg | ORAL_TABLET | Freq: Every day | ORAL | 0 refills | Status: DC

## 2021-06-11 MED ORDER — VITAMIN D (ERGOCALCIFEROL) 1.25 MG (50000 UNIT) PO CAPS: 50000.0000 [IU] | ORAL_CAPSULE | ORAL | 0 refills | Status: DC

## 2021-06-11 NOTE — Progress Notes (Signed)
At risk heart

## 2021-06-12 LAB — LIPID PANEL WITH LDL/HDL RATIO
Cholesterol, Total: 230 mg/dL — ABNORMAL HIGH (ref 100–199)
HDL: 47 mg/dL (ref >39)
LDL Chol Calc (NIH): 160 mg/dL — ABNORMAL HIGH (ref 0–99)
LDL/HDL Ratio: 3.4 ratio — ABNORMAL HIGH (ref 0.0–3.2)
Triglycerides: 129 mg/dL (ref 0–149)
VLDL Cholesterol Cal: 23 mg/dL (ref 5–40)

## 2021-06-12 LAB — HEMOGLOBIN A1C
Est. average glucose Bld gHb Est-mCnc: 131 mg/dL
Hgb A1c MFr Bld: 6.2 % — ABNORMAL HIGH (ref 4.8–5.6)

## 2021-06-12 LAB — CMP14+EGFR
ALT: 14 IU/L (ref 0–32)
AST: 12 IU/L (ref 0–40)
Albumin/Globulin Ratio: 2 (ref 1.2–2.2)
Albumin: 4.7 g/dL (ref 3.8–4.9)
Alkaline Phosphatase: 108 IU/L (ref 44–121)
BUN/Creatinine Ratio: 31 — ABNORMAL HIGH (ref 9–23)
BUN: 17 mg/dL (ref 6–24)
Bilirubin Total: 0.4 mg/dL (ref 0.0–1.2)
CO2: 22 mmol/L (ref 20–29)
Calcium: 9.6 mg/dL (ref 8.7–10.2)
Chloride: 101 mmol/L (ref 96–106)
Creatinine, Ser: 0.54 mg/dL — ABNORMAL LOW (ref 0.57–1.00)
Globulin, Total: 2.3 g/dL (ref 1.5–4.5)
Glucose: 111 mg/dL — ABNORMAL HIGH (ref 65–99)
Potassium: 5.2 mmol/L (ref 3.5–5.2)
Sodium: 138 mmol/L (ref 134–144)
Total Protein: 7 g/dL (ref 6.0–8.5)
eGFR: 108 mL/min/{1.73_m2} (ref >59)

## 2021-06-12 LAB — VITAMIN D 25 HYDROXY (VIT D DEFICIENCY, FRACTURES): Vit D, 25-Hydroxy: 54.5 ng/mL (ref 30.0–100.0)

## 2021-06-12 LAB — IGM: IgM (Immunoglobulin M), Srm: 37 mg/dL (ref 26–217)

## 2021-06-12 LAB — INSULIN, RANDOM: INSULIN: 14.9 u[IU]/mL (ref 2.6–24.9)

## 2021-06-15 NOTE — Telephone Encounter (Signed)
I have printed the results and have started to review them.

## 2021-06-15 NOTE — Progress Notes (Signed)
Chief Complaint:   OBESITY Kelly Perez is here to discuss her progress with her obesity treatment plan along with follow-up of her obesity related diagnoses. Kelly Perez is on following a lower carbohydrate, vegetable and lean protein rich diet plan and states she is following her eating plan approximately 95% of the time. Kelly Perez states she is walking for 20-30 minutes 7 times per week.  Today's visit was #: 17 Starting weight: 372 lbs Starting date: 06/17/2020 Today's weight: 322 lbs Today's date: 06/11/2021 Total lbs lost to date: 50 Total lbs lost since last in-office visit: 1  Interim History: Kelly Perez continues to do well with weight loss. Her hunger is mostly controlled and she is walking most days with her dog. She is trying to eat the lower carbohydrate diet. She is trying to meet her protein goals.  Subjective:   1. Type 2 diabetes mellitus with other specified complication, without long-term current use of insulin ([place]) Kelly Perez is stable on her medications, and she is doing well with diet.   2. Vitamin D deficiency Kelly Perez is stable, and she denies nausea, vomiting, or muscle weakness.  3. Other hyperlipidemia Kelly Perez is working on diet and exercise, and she is due for labs. She denies chest pain.  4. Abnormal laboratory test Kelly Perez had an IgM draw regaining her celiac disease which was abnormally low. She is due to have that repeated, but because she is here for her other labs she would like me to add this on.  5. At risk for heart disease Kelly Perez is at a higher than average risk for cardiovascular disease due to obesity.   Assessment/Plan:   1. Type 2 diabetes mellitus with other specified complication, without long-term current use of insulin (HCC) Kelly Perez will continue her medications and we will refill metformin for 1 month. We will check labs today. Good blood sugar control is important to decrease the likelihood of diabetic complications such as nephropathy, neuropathy, limb loss,  blindness, coronary artery disease, and death. Intensive lifestyle modification including diet, exercise and weight loss are the first line of treatment for diabetes.   - metFORMIN (GLUCOPHAGE) 500 MG tablet; Take 1 tablet (500 mg total) by mouth daily after lunch.  Dispense: 30 tablet; Refill: 0 - CMP14+EGFR - Insulin, random - Hemoglobin A1c  2. Vitamin D deficiency Low Vitamin D level contributes to fatigue and are associated with obesity, breast, and colon cancer. We will refill prescription Vitamin D for 1 month. We will check labs today, and [place] will follow-up for routine testing of Vitamin D, at least 2-3 times per year to avoid over-replacement.  - Vitamin D, Ergocalciferol, (DRISDOL) 1.25 MG (50000 UNIT) CAPS capsule; Take 1 capsule (50,000 Units total) by mouth every 7 (seven) days.  Dispense: 4 capsule; Refill: 0 - VITAMIN D 25 Hydroxy (Vit-D Deficiency, Fractures)  3. Other hyperlipidemia Cardiovascular risk and specific lipid/LDL goals reviewed. We discussed several lifestyle modifications today. We will check labs today. Kelly Perez will continue to work on diet, exercise and weight loss efforts. Orders and follow up as documented in patient record.   - Lipid Panel With LDL/HDL Ratio  4. Abnormal laboratory test We will repeat IgM today. Kelly Perez is to follow up with GI to discuss results.   - IgM  5. At risk for heart disease Kelly Perez was given approximately 30 minutes of coronary artery disease prevention counseling today. She is 55 y.o. female and has risk factors for heart disease including obesity. We discussed intensive lifestyle modifications today with  an emphasis on specific weight loss instructions and strategies.   Repetitive spaced learning was employed today to elicit superior memory formation and behavioral change.  6. Obesity with current BMI 52.0 [name] is currently in the action stage of change. As such, her goal is to continue with weight loss efforts. She has agreed  to following a lower carbohydrate, vegetable and lean protein rich diet plan.   Exercise goals: As is.  Behavioral modification strategies: increasing lean protein intake and meal planning and cooking strategies.  Kelly Perez has agreed to follow-up with our clinic in 4 weeks. She was informed of the importance of frequent follow-up visits to maximize her success with intensive lifestyle modifications for her multiple health conditions.   Kelly Perez was informed we would discuss her lab results at her next visit unless there is a critical issue that needs to be addressed sooner. Kelly Perez agreed to keep her next visit at the agreed upon time to discuss these results.  Objective:   Blood pressure 116/75, pulse 64, temperature 97.7 F (36.5 C), height _0  (1.676 m), weight (!) 322 lb (146.1 kg), last menstrual period 02/13/2018, SpO2 96 %. Body mass index is 51.97 kg/m.  General: Cooperative, alert, well developed, in no acute distress. HEENT: Conjunctivae and lids unremarkable. Cardiovascular: Regular rhythm.  Lungs: Normal work of breathing. Neurologic: No focal deficits.   Lab Results  Component Value Date   CREATININE 0.54 (L) 06/11/2021   BUN 17 06/11/2021   NA 138 06/11/2021   K 5.2 06/11/2021   CL 101 06/11/2021   CO2 22 06/11/2021   Lab Results  Component Value Date   ALT 14 06/11/2021   AST 12 06/11/2021   ALKPHOS 108 06/11/2021   BILITOT 0.4 06/11/2021   Lab Results  Component Value Date   HGBA1C 6.2 (H) 06/11/2021   HGBA1C 6.1 (H) 02/05/2021   HGBA1C 6.5 (H) 09/29/2020   HGBA1C 6.4 (H) 06/17/2020   Lab Results  Component Value Date   INSULIN 14.9 06/11/2021   INSULIN 13.9 02/05/2021   INSULIN 15.2 09/29/2020   INSULIN 27.4 (H) 06/17/2020   Lab Results  Component Value Date   TSH 2.690 06/17/2020   Lab Results  Component Value Date   CHOL 230 (H) 06/11/2021   HDL 47 06/11/2021   LDLCALC 160 (H) 06/11/2021   TRIG 129 06/11/2021   CHOLHDL 4 06/22/2018   Lab  Results  Component Value Date   VD25OH 54.5 06/11/2021   VD25OH 41.0 02/05/2021   VD25OH 60.8 09/29/2020   Lab Results  Component Value Date   WBC 9.8 09/29/2020   HGB 14.3 09/29/2020   HCT 43.4 09/29/2020   MCV 84 09/29/2020   PLT 436 09/29/2020   Lab Results  Component Value Date   IRON 82 01/26/2018   TIBC 391 01/26/2018   FERRITIN 35 01/26/2018   Attestation Statements:   Reviewed by clinician on day of visit: allergies, medications, problem list, medical history, surgical history, family history, social history, and previous encounter notes.   I, Trixie Dredge, am acting as transcriptionist for Dennard Nip, MD.  I have reviewed the above documentation for accuracy and completeness, and I agree with the above. -  Dennard Nip, MD

## 2021-06-16 ENCOUNTER — Other Ambulatory Visit: Payer: Self-pay
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
## 2021-07-03 ENCOUNTER — Other Ambulatory Visit (INDEPENDENT_AMBULATORY_CARE_PROVIDER_SITE_OTHER): Payer: Self-pay | Admitting: Family Medicine

## 2021-07-03 DIAGNOSIS — E1169 Type 2 diabetes mellitus with other specified complication: Secondary | ICD-10-CM

## 2021-07-06 ENCOUNTER — Ambulatory Visit (INDEPENDENT_AMBULATORY_CARE_PROVIDER_SITE_OTHER): Payer: 59 | Admitting: Family Medicine

## 2021-07-06 ENCOUNTER — Encounter: Payer: Self-pay | Admitting: Family Medicine

## 2021-07-06 ENCOUNTER — Other Ambulatory Visit: Payer: Self-pay

## 2021-07-06 VITALS — BP 119/72 | HR 64 | Temp 98.1°F | Ht 66.5 in | Wt 319.6 lb

## 2021-07-06 DIAGNOSIS — Z8249 Family history of ischemic heart disease and other diseases of the circulatory system: Secondary | ICD-10-CM

## 2021-07-06 DIAGNOSIS — E118 Type 2 diabetes mellitus with unspecified complications: Secondary | ICD-10-CM

## 2021-07-06 DIAGNOSIS — I10 Essential (primary) hypertension: Secondary | ICD-10-CM

## 2021-07-06 DIAGNOSIS — E1169 Type 2 diabetes mellitus with other specified complication: Secondary | ICD-10-CM

## 2021-07-06 DIAGNOSIS — E785 Hyperlipidemia, unspecified: Secondary | ICD-10-CM

## 2021-07-06 DIAGNOSIS — E669 Obesity, unspecified: Secondary | ICD-10-CM | POA: Insufficient documentation

## 2021-07-06 DIAGNOSIS — Z6841 Body Mass Index (BMI) 40.0 and over, adult: Secondary | ICD-10-CM

## 2021-07-06 MED ORDER — BISOPROLOL FUMARATE 10 MG PO TABS: 10.0000 mg | ORAL_TABLET | Freq: Every day | ORAL | 1 refills | Status: DC

## 2021-07-06 MED ORDER — EZETIMIBE 10 MG PO TABS: 10.0000 mg | ORAL_TABLET | Freq: Every day | ORAL | 3 refills | Status: DC
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
## 2021-07-06 NOTE — Telephone Encounter (Signed)
Last OV with Dr. Beasley 

## 2021-07-06 NOTE — Patient Instructions (Addendum)
Great to meet you today.  I have refilled the medication(s) we provide.   If you start zetia- follow up in 8-12 weeks fasting. If not then 6 mos for your physical.   If labs were collected, we will inform you of lab results once received either by echart message or telephone call.   - echart message- for normal results that have been seen by the patient already.   - telephone call: abnormal results or if patient has not viewed results in their echart.   Preventing High Cholesterol Cholesterol is a white, waxy substance similar to fat that the human body needs to help build cells. The liver makes all the cholesterol that a person's body needs. Having high cholesterol (hypercholesterolemia) increases your risk for heart disease and stroke. Extra or excess cholesterol comes from the food that you eat. High cholesterol can often be prevented with diet and lifestyle changes. If you already have high cholesterol, you can control it with diet, lifestyle changes, and medicines. How can high cholesterol affect me? If you have high cholesterol, fatty deposits (plaques) may build up on the walls of your blood vessels. The blood vessels that carry blood away from your heart are called arteries. Plaques make the arteries narrower and stiffer. This in turn can: Restrict or block blood flow and cause blood clots to form. Increase your risk for heart attack and stroke. What can increase my risk for high cholesterol? This condition is more likely to develop in people who: Eat foods that are high in saturated fat or cholesterol. Saturated fat is mostly found in foods that come from animal sources. Are overweight. Are not getting enough exercise. Use products that contain nicotine or tobacco, such as cigarettes, e-cigarettes, and chewing tobacco. Have a family history of high cholesterol (familial hypercholesterolemia). What actions can I take to prevent this? Nutrition  Eat less saturated fat. Avoid trans  fats (partially hydrogenated oils). These are often found in margarine and in some baked goods, fried foods, and snacks bought in packages. Avoid precooked or cured meat, such as bacon, sausages, or meat loaves. Avoid foods and drinks that have added sugars. Eat more fruits, vegetables, and whole grains. Choose healthy sources of protein, such as fish, poultry, lean cuts of red meat, beans, peas, lentils, and nuts. Choose healthy sources of fat, such as: Nuts. Vegetable oils, especially olive oil. Fish that have healthy fats, such as omega-3 fatty acids. These fish include mackerel or salmon. Lifestyle Lose weight if you are overweight. Maintaining a healthy body mass index (BMI) can help prevent or control high cholesterol. It can also lower your risk for diabetes and high blood pressure. Ask your health care provider to help you with a diet and exercise plan to lose weight safely. Do not use any products that contain nicotine or tobacco. These products include cigarettes, chewing tobacco, and vaping devices, such as e-cigarettes. If you need help quitting, ask your health care provider. Alcohol use Do not drink alcohol if: Your health care provider tells you not to drink. You are pregnant, may be pregnant, or are planning to become pregnant. If you drink alcohol: Limit how much you have to: 0-1 drink a day for women. 0-2 drinks a day for men. Know how much alcohol is in your drink. In the [place] one drink equals one 12 oz bottle of beer (355 mL), one 5 oz glass of wine (148 mL), or one 1 oz glass of hard liquor (44 mL). Activity  Get enough  exercise. Do exercises as told by your health care provider. Each week, do at least 150 minutes of exercise that takes a medium level of effort (moderate-intensity exercise). This kind of exercise: Makes your heart beat faster while allowing you to still be able to talk. Can be done in short sessions several times a day or longer sessions a few times a  week. For example, on 5 days each week, you could walk fast or ride your bike 3 times a day for 10 minutes each time. Medicines Your health care provider may recommend medicines to help lower cholesterol. This may be a medicine to lower the amount of cholesterol that your liver makes. You may need medicine if: Diet and lifestyle changes have not lowered your cholesterol enough. You have high cholesterol and other risk factors for heart disease or stroke. Take over-the-counter and prescription medicines only as told by your health care provider. General information Manage your risk factors for high cholesterol. Talk with your health care provider about all your risk factors and how to lower your risk. Manage other conditions that you have, such as diabetes or high blood pressure (hypertension). Have blood tests to check your cholesterol levels at regular points in time as told by your health care provider. Keep all follow-up visits. This is important. Where to find more information American Heart Association: www.heart.org National Heart, Lung, and Blood Institute: https://wilson-eaton.com/ Summary High cholesterol increases your risk for heart disease and stroke. By keeping your cholesterol level low, you can reduce your risk for these conditions. High cholesterol can often be prevented with diet and lifestyle changes. Work with your health care provider to manage your risk factors, and have your blood tested regularly. This information is not intended to replace advice given to you by your health care provider. Make sure you discuss any questions you have with your health care provider. Document Revised: 12/15/2020 Document Reviewed: 12/15/2020 Elsevier Patient Education  2022 [employer].

## 2021-07-08 ENCOUNTER — Encounter: Payer: Self-pay | Admitting: Family Medicine

## 2021-07-08 DIAGNOSIS — G4730 Sleep apnea, unspecified: Secondary | ICD-10-CM | POA: Insufficient documentation

## 2021-07-09 ENCOUNTER — Encounter (INDEPENDENT_AMBULATORY_CARE_PROVIDER_SITE_OTHER): Payer: Self-pay | Admitting: Family Medicine

## 2021-07-09 ENCOUNTER — Ambulatory Visit (INDEPENDENT_AMBULATORY_CARE_PROVIDER_SITE_OTHER): Payer: 59 | Admitting: Family Medicine

## 2021-07-09 ENCOUNTER — Other Ambulatory Visit: Payer: Self-pay

## 2021-07-09 VITALS — BP 117/72 | HR 68 | Temp 98.1°F | Ht 66.0 in | Wt 315.0 lb

## 2021-07-09 DIAGNOSIS — E1169 Type 2 diabetes mellitus with other specified complication: Secondary | ICD-10-CM

## 2021-07-09 DIAGNOSIS — E559 Vitamin D deficiency, unspecified: Secondary | ICD-10-CM

## 2021-07-09 DIAGNOSIS — Z9189 Other specified personal risk factors, not elsewhere classified: Secondary | ICD-10-CM | POA: Diagnosis not present

## 2021-07-09 DIAGNOSIS — Z6841 Body Mass Index (BMI) 40.0 and over, adult: Secondary | ICD-10-CM | POA: Diagnosis not present

## 2021-07-09 MED ORDER — METFORMIN HCL 500 MG PO TABS: 500.0000 mg | ORAL_TABLET | Freq: Every day | ORAL | 0 refills | Status: DC

## 2021-07-09 MED ORDER — VITAMIN D (ERGOCALCIFEROL) 1.25 MG (50000 UNIT) PO CAPS: 50000.0000 [IU] | ORAL_CAPSULE | ORAL | 0 refills | Status: DC

## 2021-07-12 NOTE — Progress Notes (Signed)
Chief Complaint:   OBESITY Kelly Perez is here to discuss her progress with her obesity treatment plan along with follow-up of her obesity related diagnoses. Kelly Perez is on following a lower carbohydrate, vegetable and lean protein rich diet plan and states she is following her eating plan approximately 95% of the time. Kelly Perez states she is walking for 20-30 minutes 7 times per week.  Today's visit was #: 34 Starting weight: 372 lbs Starting date: 06/17/2020 Today's weight: 315 lbs Today's date: 07/09/2021 Total lbs lost to date: 68 Total lbs lost since last in-office visit: 7  Interim History: Kelly Perez continues to do well with weight loss. Her hunger is controlled. She is taking psyllium capsules before each meal and she feels it helps.  Subjective:   1. Type 2 diabetes mellitus with other specified complication, without long-term current use of insulin (HCC) Kelly Perez is doing very well with diet, exercise, and weight loss. She is stable on metformin, and her A1c is at goal.  2. Vitamin D deficiency Kelly Perez is on Vit D, and her level is almost at goal. She denies nausea, vomiting, or muscle weakness.  3. At risk for dehydration Kelly Perez is at risk for dehydration due to weight loss.  Assessment/Plan:   1. Type 2 diabetes mellitus with other specified complication, without long-term current use of insulin ([place]) Kyilee will continue metformin and we will refill for 1 month. Good blood sugar control is important to decrease the likelihood of diabetic complications such as nephropathy, neuropathy, limb loss, blindness, coronary artery disease, and death. Intensive lifestyle modification including diet, exercise and weight loss are the first line of treatment for diabetes.   - metFORMIN (GLUCOPHAGE) 500 MG tablet; Take 1 tablet (500 mg total) by mouth daily after lunch.  Dispense: 30 tablet; Refill: 0  2. Vitamin D deficiency Low Vitamin D level contributes to fatigue and are associated with obesity,  breast, and colon cancer. We will refill prescription Vitamin D for 1 month. Christinna will follow-up for routine testing of Vitamin D, at least 2-3 times per year to avoid over-replacement.  - Vitamin D, Ergocalciferol, (DRISDOL) 1.25 MG (50000 UNIT) CAPS capsule; Take 1 capsule (50,000 Units total) by mouth every 7 (seven) days.  Dispense: 4 capsule; Refill: 0  3. At risk for dehydration Kelly Perez was given approximately 15 minutes dehydration prevention counseling today. Braden is at risk for dehydration due to weight loss and current medication(s). She was encouraged to hydrate and monitor fluid status to avoid dehydration as well as weight loss plateaus.   4. Obesity with current BMI 50.9 Kelly Perez is currently in the action stage of change. As such, her goal is to continue with weight loss efforts. She has agreed to following a lower carbohydrate, vegetable and lean protein rich diet plan.   Exercise goals: As is.  Behavioral modification strategies: increasing lean protein intake and increasing water intake.  Kelly Perez has agreed to follow-up with our clinic in 4 weeks. She was informed of the importance of frequent follow-up visits to maximize her success with intensive lifestyle modifications for her multiple health conditions.   Objective:   Blood pressure 117/72, pulse 68, temperature 98.1 F (36.7 C), height 5' 6"  (1.676 m), weight (!) 315 lb (142.9 kg), last menstrual period 02/13/2018, SpO2 95 %. Body mass index is 50.84 kg/m.  General: Cooperative, alert, well developed, in no acute distress. HEENT: Conjunctivae and lids unremarkable. Cardiovascular: Regular rhythm.  Lungs: Normal work of breathing. Neurologic: No focal deficits.  Lab Results  Component Value Date   CREATININE 0.54 (L) 06/11/2021   BUN 17 06/11/2021   NA 138 06/11/2021   K 5.2 06/11/2021   CL 101 06/11/2021   CO2 22 06/11/2021   Lab Results  Component Value Date   ALT 14 06/11/2021   AST 12 06/11/2021    ALKPHOS 108 06/11/2021   BILITOT 0.4 06/11/2021   Lab Results  Component Value Date   HGBA1C 6.2 (H) 06/11/2021   HGBA1C 6.1 (H) 02/05/2021   HGBA1C 6.5 (H) 09/29/2020   HGBA1C 6.4 (H) 06/17/2020   Lab Results  Component Value Date   INSULIN 14.9 06/11/2021   INSULIN 13.9 02/05/2021   INSULIN 15.2 09/29/2020   INSULIN 27.4 (H) 06/17/2020   Lab Results  Component Value Date   TSH 2.690 06/17/2020   Lab Results  Component Value Date   CHOL 230 (H) 06/11/2021   HDL 47 06/11/2021   LDLCALC 160 (H) 06/11/2021   TRIG 129 06/11/2021   CHOLHDL 4 06/22/2018   Lab Results  Component Value Date   VD25OH 54.5 06/11/2021   VD25OH 41.0 02/05/2021   VD25OH 60.8 09/29/2020   Lab Results  Component Value Date   WBC 9.8 09/29/2020   HGB 14.3 09/29/2020   HCT 43.4 09/29/2020   MCV 84 09/29/2020   PLT 436 09/29/2020   Lab Results  Component Value Date   IRON 82 01/26/2018   TIBC 391 01/26/2018   FERRITIN 35 01/26/2018   Attestation Statements:   Reviewed by clinician on day of visit: allergies, medications, problem list, medical history, surgical history, family history, social history, and previous encounter notes.   I, Trixie Dredge, am acting as transcriptionist for Dennard Nip, MD.  I have reviewed the above documentation for accuracy and completeness, and I agree with the above. -  Dennard Nip, MD

## 2021-07-17 ENCOUNTER — Ambulatory Visit (INDEPENDENT_AMBULATORY_CARE_PROVIDER_SITE_OTHER): Payer: 59 | Admitting: Gastroenterology

## 2021-07-17 ENCOUNTER — Encounter: Payer: Self-pay | Admitting: Gastroenterology

## 2021-07-17 VITALS — BP 110/60 | HR 55 | Ht 66.0 in | Wt 319.0 lb

## 2021-07-17 DIAGNOSIS — K760 Fatty (change of) liver, not elsewhere classified: Secondary | ICD-10-CM | POA: Diagnosis not present

## 2021-07-17 DIAGNOSIS — R748 Abnormal levels of other serum enzymes: Secondary | ICD-10-CM
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
## 2021-07-17 NOTE — Patient Instructions (Addendum)
It was my pleasure to provide care to you today. Based on our discussion, I am providing you with my recommendations below:  RECOMMENDATION(S):   LABS:   Please have your labs completed PRIOR to your follow up appointment in September 2023 Please proceed to the basement level for lab work. Press "B" on the elevator. The lab is located at the first door on the left as you exit the elevator.  HEALTHCARE LAWS AND MY CHART RESULTS:   Due to recent changes in healthcare laws, you may see results of your imaging and/or laboratory studies on MyChart before I have had a chance to review them.  I understand that in some cases there may be results that are confusing or concerning to you. Please understand that not all results are received at same time and often I may need to interpret multiple results in order to provide you with the best plan of care or course of treatment. Therefore, I ask that you please give me 48 hours to thoroughly review all your results before contacting my office for clarification.   FOLLOW UP:  I would like for you to follow up with me in 1 year. Please call the office at [phone] to schedule your appointment. Please call the office if you develop any celiac symptoms. We will need to repeat labs at that time.  BMI:  If you are age 32 or younger, your body mass index should be between 19-25. Your Body mass index is 51.49 kg/m. If this is out of the aformentioned range listed, please consider follow up with your Primary Care Provider.   MY CHART:  The [place] [facility] providers would like to encourage you to use [facility] to communicate with providers for non-urgent requests or questions.  Due to long hold times on the telephone, sending your provider a message by [facility] may be a faster and more efficient way to get a response.  Please allow 48 business hours for a response.  Please remember that this is for non-urgent requests.   Thank you for trusting me with your  gastrointestinal care!    Thornton Park, MD, MPH

## 2021-07-17 NOTE — Telephone Encounter (Signed)
Discussed during office visit today.

## 2021-07-24 ENCOUNTER — Encounter: Payer: Self-pay | Admitting: Gastroenterology

## 2021-08-02 ENCOUNTER — Other Ambulatory Visit (INDEPENDENT_AMBULATORY_CARE_PROVIDER_SITE_OTHER): Payer: Self-pay | Admitting: Family Medicine

## 2021-08-02 DIAGNOSIS — E1169 Type 2 diabetes mellitus with other specified complication: Secondary | ICD-10-CM

## 2021-08-03 NOTE — Telephone Encounter (Signed)
Will refill at 08/06/21 office visit

## 2021-08-06 ENCOUNTER — Other Ambulatory Visit: Payer: Self-pay

## 2021-08-06 ENCOUNTER — Ambulatory Visit (INDEPENDENT_AMBULATORY_CARE_PROVIDER_SITE_OTHER): Payer: 59 | Admitting: Family Medicine

## 2021-08-06 ENCOUNTER — Encounter (INDEPENDENT_AMBULATORY_CARE_PROVIDER_SITE_OTHER): Payer: Self-pay | Admitting: Family Medicine

## 2021-08-06 VITALS — BP 133/79 | HR 60 | Temp 98.2°F | Ht 66.0 in | Wt 317.0 lb

## 2021-08-06 DIAGNOSIS — Z9189 Other specified personal risk factors, not elsewhere classified: Secondary | ICD-10-CM

## 2021-08-06 DIAGNOSIS — E7849 Other hyperlipidemia: Secondary | ICD-10-CM | POA: Diagnosis not present

## 2021-08-06 DIAGNOSIS — E559 Vitamin D deficiency, unspecified: Secondary | ICD-10-CM

## 2021-08-06 DIAGNOSIS — Z6841 Body Mass Index (BMI) 40.0 and over, adult: Secondary | ICD-10-CM

## 2021-08-06 DIAGNOSIS — E1169 Type 2 diabetes mellitus with other specified complication: Secondary | ICD-10-CM | POA: Diagnosis not present

## 2021-08-06 MED ORDER — VITAMIN D (ERGOCALCIFEROL) 1.25 MG (50000 UNIT) PO CAPS: 50000.0000 [IU] | ORAL_CAPSULE | ORAL | 0 refills | Status: DC

## 2021-08-06 MED ORDER — METFORMIN HCL 500 MG PO TABS: 500.0000 mg | ORAL_TABLET | Freq: Every day | ORAL | 0 refills | Status: DC

## 2021-08-10 NOTE — Progress Notes (Signed)
Chief Complaint:   OBESITY Kelly Perez is here to discuss her progress with her obesity treatment plan along with follow-up of her obesity related diagnoses. Kelly Perez is on following a lower carbohydrate, vegetable and lean protein rich diet plan and states she is following her eating plan approximately 95% of the time. Kelly Perez states she is walking the dog for 25 minutes 7 times per week.  Today's visit was #: 21 Starting weight: 372 lbs Starting date: 06/17/2020 Today's weight: 317 lbs Today's date: 08/06/2021 Total lbs lost to date: 44 Total lbs lost since last in-office visit: 0  Interim History: Kelly Perez continues to do well with weight loss on her plan. Her hunger is controlled and she is doing very well with decreasing simple carbohydrates (especially gluten). She is active walking her dog daily.  Subjective:   1. Vitamin D deficiency Kelly Perez is doing well on Vit D, and she requests a refill today.  2. Other hyperlipidemia Kelly Perez started Zetia, with no problems noted. She continues to walk on diet and weight loss.  3. Type 2 diabetes mellitus with other specified complication, without long-term current use of insulin (HCC) Kelly Perez continues to do well with diet and weight loss. No side effects noted with metformin.  4. At risk for dehydration Kelly Perez is at risk for dehydration with continued weight loss and the lower carbohydrate plan.  Assessment/Plan:   1. Vitamin D deficiency Low Vitamin D level contributes to fatigue and are associated with obesity, breast, and colon cancer. We will refill prescription Vitamin D for 1 month. Kelly Perez will follow-up for routine testing of Vitamin D, at least 2-3 times per year to avoid over-replacement.  - Vitamin D, Ergocalciferol, (DRISDOL) 1.25 MG (50000 UNIT) CAPS capsule; Take 1 capsule (50,000 Units total) by mouth every 7 (seven) days.  Dispense: 4 capsule; Refill: 0  2. Other hyperlipidemia Cardiovascular risk and specific lipid/LDL goals  reviewed. We discussed several lifestyle modifications today. Kelly Perez will continue to work on diet, exercise and weight loss efforts. Low cholesterol lean protein was encouraged. Orders and follow up as documented in patient record.   3. Type 2 diabetes mellitus with other specified complication, without long-term current use of insulin ([place]) Kelly Perez will continue metformin, and we will refill metformin for 1 month. Good blood sugar control is important to decrease the likelihood of diabetic complications such as nephropathy, neuropathy, limb loss, blindness, coronary artery disease, and death. Intensive lifestyle modification including diet, exercise and weight loss are the first line of treatment for diabetes.   - metFORMIN (GLUCOPHAGE) 500 MG tablet; Take 1 tablet (500 mg total) by mouth daily after lunch.  Dispense: 30 tablet; Refill: 0  4. At risk for dehydration Kelly Perez was given approximately 15 minutes dehydration prevention counseling today. Kelly Perez is at risk for dehydration due to weight loss and current medication(s). She was encouraged to hydrate and monitor fluid status to avoid dehydration as well as weight loss plateaus.   5. Obesity with current BMI 51.2 Kelly Perez is currently in the action stage of change. As such, her goal is to continue with weight loss efforts. She has agreed to following a lower carbohydrate, vegetable and lean protein rich diet plan.   Exercise goals: As is.  Behavioral modification strategies: increasing water intake, meal planning and cooking strategies, and holiday eating strategies .  Kelly Perez has agreed to follow-up with our clinic in 4 to 5 weeks. She was informed of the importance of frequent follow-up visits to maximize her success with  intensive lifestyle modifications for her multiple health conditions.   Objective:   Blood pressure 133/79, pulse 60, temperature 98.2 F (36.8 C), height 5' 6"  (1.676 m), weight (!) 317 lb (143.8 kg), last menstrual period  02/13/2018, SpO2 96 %. Body mass index is 51.17 kg/m.  General: Cooperative, alert, well developed, in no acute distress. HEENT: Conjunctivae and lids unremarkable. Cardiovascular: Regular rhythm.  Lungs: Normal work of breathing. Neurologic: No focal deficits.   Lab Results  Component Value Date   CREATININE 0.54 (L) 06/11/2021   BUN 17 06/11/2021   NA 138 06/11/2021   K 5.2 06/11/2021   CL 101 06/11/2021   CO2 22 06/11/2021   Lab Results  Component Value Date   ALT 14 06/11/2021   AST 12 06/11/2021   ALKPHOS 108 06/11/2021   BILITOT 0.4 06/11/2021   Lab Results  Component Value Date   HGBA1C 6.2 (H) 06/11/2021   HGBA1C 6.1 (H) 02/05/2021   HGBA1C 6.5 (H) 09/29/2020   HGBA1C 6.4 (H) 06/17/2020   Lab Results  Component Value Date   INSULIN 14.9 06/11/2021   INSULIN 13.9 02/05/2021   INSULIN 15.2 09/29/2020   INSULIN 27.4 (H) 06/17/2020   Lab Results  Component Value Date   TSH 2.690 06/17/2020   Lab Results  Component Value Date   CHOL 230 (H) 06/11/2021   HDL 47 06/11/2021   LDLCALC 160 (H) 06/11/2021   TRIG 129 06/11/2021   CHOLHDL 4 06/22/2018   Lab Results  Component Value Date   VD25OH 54.5 06/11/2021   VD25OH 41.0 02/05/2021   VD25OH 60.8 09/29/2020   Lab Results  Component Value Date   WBC 9.8 09/29/2020   HGB 14.3 09/29/2020   HCT 43.4 09/29/2020   MCV 84 09/29/2020   PLT 436 09/29/2020   Lab Results  Component Value Date   IRON 82 01/26/2018   TIBC 391 01/26/2018   FERRITIN 35 01/26/2018   Attestation Statements:   Reviewed by clinician on day of visit: allergies, medications, problem list, medical history, surgical history, family history, social history, and previous encounter notes.   I, Trixie Dredge, am acting as transcriptionist for Dennard Nip, MD.  I have reviewed the above documentation for accuracy and completeness, and I agree with the above. -  Dennard Nip, MD

## 2021-08-30 ENCOUNTER — Other Ambulatory Visit (INDEPENDENT_AMBULATORY_CARE_PROVIDER_SITE_OTHER): Payer: Self-pay | Admitting: Family Medicine

## 2021-08-30 DIAGNOSIS — E1169 Type 2 diabetes mellitus with other specified complication: Secondary | ICD-10-CM

## 2021-08-31 NOTE — Telephone Encounter (Signed)
LAST APPOINTMENT DATE: 08/06/21 NEXT APPOINTMENT DATE: 09/08/21   [id number] [place], [place] - [address] [place] [address]Phone: [phone]: [phone] Patient is requesting a refill of the following medications: Requested Prescriptions   Pending Prescriptions Disp Refills   metFORMIN (GLUCOPHAGE) 500 MG tablet [Pharmacy Med Name: METFORMIN HCL 500 MG TABLET] 30 tablet 0    Sig: TAKE 1 TABLET (500 MG TOTAL) BY MOUTH DAILY AFTER LUNCH.    Date last filled: 08/06/21 Previously prescribed by Dr. BLeafy Ro Lab Results  Component Value Date   HGBA1C 6.2 (H) 06/11/2021   HGBA1C 6.1 (H) 02/05/2021   HGBA1C 6.5 (H) 09/29/2020   Lab Results  Component Value Date   LDLCALC 160 (H) 06/11/2021   CREATININE 0.54 (L) 06/11/2021   Lab Results  Component Value Date   VD25OH 54.5 06/11/2021   VD25OH 41.0 02/05/2021   VD25OH 60.8 09/29/2020    BP Readings from Last 3 Encounters:  08/06/21 133/79  07/17/21 110/60  07/09/21 117/72  .

## 2021-08-31 NOTE — Telephone Encounter (Signed)
Last OV with Dr. Beasley 

## 2021-09-08 ENCOUNTER — Ambulatory Visit (INDEPENDENT_AMBULATORY_CARE_PROVIDER_SITE_OTHER): Payer: 59 | Admitting: Family Medicine

## 2021-09-08 ENCOUNTER — Encounter (INDEPENDENT_AMBULATORY_CARE_PROVIDER_SITE_OTHER): Payer: Self-pay | Admitting: Family Medicine

## 2021-09-08 ENCOUNTER — Other Ambulatory Visit: Payer: Self-pay

## 2021-09-08 VITALS — BP 121/69 | HR 62 | Temp 98.3°F | Ht 66.0 in | Wt 312.0 lb

## 2021-09-08 DIAGNOSIS — E1169 Type 2 diabetes mellitus with other specified complication: Secondary | ICD-10-CM

## 2021-09-08 DIAGNOSIS — E559 Vitamin D deficiency, unspecified: Secondary | ICD-10-CM | POA: Diagnosis not present

## 2021-09-08 DIAGNOSIS — Z9189 Other specified personal risk factors, not elsewhere classified: Secondary | ICD-10-CM | POA: Diagnosis not present

## 2021-09-08 DIAGNOSIS — Z6841 Body Mass Index (BMI) 40.0 and over, adult: Secondary | ICD-10-CM

## 2021-09-08 DIAGNOSIS — E785 Hyperlipidemia, unspecified: Secondary | ICD-10-CM

## 2021-09-08 MED ORDER — VITAMIN D (ERGOCALCIFEROL) 1.25 MG (50000 UNIT) PO CAPS: 50000.0000 [IU] | ORAL_CAPSULE | ORAL | 0 refills | Status: DC

## 2021-09-08 MED ORDER — METFORMIN HCL 500 MG PO TABS: 500.0000 mg | ORAL_TABLET | Freq: Every day | ORAL | 0 refills | Status: DC

## 2021-09-08 NOTE — Progress Notes (Signed)
Chief Complaint:   OBESITY Kelly Perez is here to discuss her progress with her obesity treatment plan along with follow-up of her obesity related diagnoses. Kelly Perez is on following a lower carbohydrate, vegetable and lean protein rich diet plan and states she is following her eating plan approximately 95% of the time. Kelly Perez states she is walking the dog for 25-30 minutes 7 times per week.  Today's visit was #: 20 Starting weight: 372 lbs Starting date: 06/17/2020 Today's weight: 312 lbs Today's date: 09/08/2021 Total lbs lost to date: 60 Total lbs lost since last in-office visit: 5  Interim History: Kelly Perez continues to do well with weight loss on her plan. She has good strategies for Thanksgiving.  Subjective:   1. Type 2 diabetes mellitus with other specified complication, without long-term current use of insulin ([place]) [name] is stable on metformin, and she is working on diet and exercise. She has no problems with metformin.  2. Vitamin D deficiency [name] is on Vit D, and she has no signs of over-replacement. She is due for labs.  3. Hyperlipidemia associated with type 2 diabetes mellitus ([place]) [name] is working on diet and exercise, and she is on Zetia. She is due for labs.  4. At risk for heart disease [name] is at a higher than average risk for cardiovascular disease due to obesity.   Assessment/Plan:   1. Type 2 diabetes mellitus with other specified complication, without long-term current use of insulin (HCC) We will check labs today, and we will refill metformin for 1 month. Elizaveta will continue to follow up as directed. Good blood sugar control is important to decrease the likelihood of diabetic complications such as nephropathy, neuropathy, limb loss, blindness, coronary artery disease, and death. Intensive lifestyle modification including diet, exercise and weight loss are the first line of treatment for diabetes.   - metFORMIN (GLUCOPHAGE) 500 MG tablet; Take 1 tablet (500 mg  total) by mouth daily after lunch.  Dispense: 30 tablet; Refill: 0 - CMP14+EGFR - Insulin, random - Hemoglobin A1c - Microalbumin / creatinine urine ratio  2. Vitamin D deficiency Low Vitamin D level contributes to fatigue and are associated with obesity, breast, and colon cancer. We will refill prescription Vitamin D for 1 month. We will check labs today, and Nidya will follow-up for routine testing of Vitamin D, at least 2-3 times per year to avoid over-replacement.  - Vitamin D, Ergocalciferol, (DRISDOL) 1.25 MG (50000 UNIT) CAPS capsule; Take 1 capsule (50,000 Units total) by mouth every 7 (seven) days.  Dispense: 4 capsule; Refill: 0 - VITAMIN D 25 Hydroxy (Vit-D Deficiency, Fractures)  3. Hyperlipidemia associated with type 2 diabetes mellitus ([place]) Cardiovascular risk and specific lipid/LDL goals reviewed. We discussed several lifestyle modifications today. We will check labs today. Maddisen will continue to work on diet, exercise and weight loss efforts. Orders and follow up as documented in patient record.   - Lipid Panel With LDL/HDL Ratio  4. At risk for heart disease [name] was given approximately 15 minutes of coronary artery disease prevention counseling today. She is 55 y.o. female and has risk factors for heart disease including obesity. We discussed intensive lifestyle modifications today with an emphasis on specific weight loss instructions and strategies.   Repetitive spaced learning was employed today to elicit superior memory formation and behavioral change.  5. Obesity with current BMI 50.5 [name] is currently in the action stage of change. As such, her goal is to continue with weight loss efforts. She has  agreed to following a lower carbohydrate, vegetable and lean protein rich diet plan.   Exercise goals: As is.  Behavioral modification strategies: holiday eating strategies .  [name] has agreed to follow-up with our clinic in 4 weeks. She was informed of the importance  of frequent follow-up visits to maximize her success with intensive lifestyle modifications for her multiple health conditions.   [name] was informed we would discuss her lab results at her next visit unless Kelly is a critical issue that needs to be addressed sooner. [name] agreed to keep her next visit at the agreed upon time to discuss these results.  Objective:   Blood pressure 121/69, pulse 62, temperature 98.3 F (36.8 C), temperature source Oral, height _0  (1.676 m), weight (!) 312 lb (141.5 kg), last menstrual period 02/13/2018, SpO2 96 %. Body mass index is 50.36 kg/m.  General: Cooperative, alert, well developed, in no acute distress. HEENT: Conjunctivae and lids unremarkable. Cardiovascular: Regular rhythm.  Lungs: Normal work of breathing. Neurologic: No focal deficits.   Lab Results  Component Value Date   CREATININE 0.54 (L) 06/11/2021   BUN 17 06/11/2021   NA 138 06/11/2021   K 5.2 06/11/2021   CL 101 06/11/2021   CO2 22 06/11/2021   Lab Results  Component Value Date   ALT 14 06/11/2021   AST 12 06/11/2021   ALKPHOS 108 06/11/2021   BILITOT 0.4 06/11/2021   Lab Results  Component Value Date   HGBA1C 6.2 (H) 06/11/2021   HGBA1C 6.1 (H) 02/05/2021   HGBA1C 6.5 (H) 09/29/2020   HGBA1C 6.4 (H) 06/17/2020   Lab Results  Component Value Date   INSULIN 14.9 06/11/2021   INSULIN 13.9 02/05/2021   INSULIN 15.2 09/29/2020   INSULIN 27.4 (H) 06/17/2020   Lab Results  Component Value Date   TSH 2.690 06/17/2020   Lab Results  Component Value Date   CHOL 230 (H) 06/11/2021   HDL 47 06/11/2021   LDLCALC 160 (H) 06/11/2021   TRIG 129 06/11/2021   CHOLHDL 4 06/22/2018   Lab Results  Component Value Date   VD25OH 54.5 06/11/2021   VD25OH 41.0 02/05/2021   VD25OH 60.8 09/29/2020   Lab Results  Component Value Date   WBC 9.8 09/29/2020   HGB 14.3 09/29/2020   HCT 43.4 09/29/2020   MCV 84 09/29/2020   PLT 436 09/29/2020   Lab Results  Component  Value Date   IRON 82 01/26/2018   TIBC 391 01/26/2018   FERRITIN 35 01/26/2018   Attestation Statements:   Reviewed by clinician on day of visit: allergies, medications, problem list, medical history, surgical history, family history, social history, and previous encounter notes.   I, Trixie Dredge, am acting as transcriptionist for Dennard Nip, MD.  I have reviewed the above documentation for accuracy and completeness, and I agree with the above. -  Dennard Nip, MD

## 2021-09-09 LAB — CMP14+EGFR
ALT: 12 IU/L (ref 0–32)
AST: 9 IU/L (ref 0–40)
Albumin/Globulin Ratio: 1.9 (ref 1.2–2.2)
Albumin: 4.5 g/dL (ref 3.8–4.9)
Alkaline Phosphatase: 106 IU/L (ref 44–121)
BUN/Creatinine Ratio: 25 — ABNORMAL HIGH (ref 9–23)
BUN: 14 mg/dL (ref 6–24)
Bilirubin Total: 0.3 mg/dL (ref 0.0–1.2)
CO2: 23 mmol/L (ref 20–29)
Calcium: 9.5 mg/dL (ref 8.7–10.2)
Chloride: 103 mmol/L (ref 96–106)
Creatinine, Ser: 0.55 mg/dL — ABNORMAL LOW (ref 0.57–1.00)
Globulin, Total: 2.4 g/dL (ref 1.5–4.5)
Glucose: 107 mg/dL — ABNORMAL HIGH (ref 70–99)
Potassium: 5.2 mmol/L (ref 3.5–5.2)
Sodium: 140 mmol/L (ref 134–144)
Total Protein: 6.9 g/dL (ref 6.0–8.5)
eGFR: 108 mL/min/{1.73_m2} (ref >59)

## 2021-09-09 LAB — MICROALBUMIN / CREATININE URINE RATIO
Creatinine, Urine: 22.4 mg/dL
Microalb/Creat Ratio: <13 mg/g creat (ref 0–29)
Microalbumin, Urine: <3 ug/mL

## 2021-09-09 LAB — LIPID PANEL WITH LDL/HDL RATIO
Cholesterol, Total: 186 mg/dL (ref 100–199)
HDL: 47 mg/dL (ref >39)
LDL Chol Calc (NIH): 124 mg/dL — ABNORMAL HIGH (ref 0–99)
LDL/HDL Ratio: 2.6 ratio (ref 0.0–3.2)
Triglycerides: 82 mg/dL (ref 0–149)
VLDL Cholesterol Cal: 15 mg/dL (ref 5–40)

## 2021-09-09 LAB — INSULIN, RANDOM: INSULIN: 12.4 u[IU]/mL (ref 2.6–24.9)

## 2021-09-09 LAB — HEMOGLOBIN A1C
Est. average glucose Bld gHb Est-mCnc: 131 mg/dL
Hgb A1c MFr Bld: 6.2 % — ABNORMAL HIGH (ref 4.8–5.6)

## 2021-09-09 LAB — VITAMIN D 25 HYDROXY (VIT D DEFICIENCY, FRACTURES): Vit D, 25-Hydroxy: 59.3 ng/mL (ref 30.0–100.0)

## 2021-09-24 ENCOUNTER — Other Ambulatory Visit (INDEPENDENT_AMBULATORY_CARE_PROVIDER_SITE_OTHER): Payer: Self-pay | Admitting: Family Medicine

## 2021-09-24 DIAGNOSIS — E1169 Type 2 diabetes mellitus with other specified complication: Secondary | ICD-10-CM

## 2021-09-24 NOTE — Telephone Encounter (Signed)
Dr.Beasley 

## 2021-10-06 ENCOUNTER — Ambulatory Visit (INDEPENDENT_AMBULATORY_CARE_PROVIDER_SITE_OTHER): Payer: 59 | Admitting: Family Medicine

## 2021-10-06 ENCOUNTER — Other Ambulatory Visit: Payer: Self-pay

## 2021-10-06 ENCOUNTER — Encounter (INDEPENDENT_AMBULATORY_CARE_PROVIDER_SITE_OTHER): Payer: Self-pay | Admitting: Family Medicine

## 2021-10-06 VITALS — BP 126/71 | HR 56 | Temp 98.2°F | Ht 66.0 in | Wt 309.0 lb

## 2021-10-06 DIAGNOSIS — E559 Vitamin D deficiency, unspecified: Secondary | ICD-10-CM

## 2021-10-06 DIAGNOSIS — Z6841 Body Mass Index (BMI) 40.0 and over, adult: Secondary | ICD-10-CM

## 2021-10-06 DIAGNOSIS — E1169 Type 2 diabetes mellitus with other specified complication: Secondary | ICD-10-CM | POA: Diagnosis not present

## 2021-10-06 DIAGNOSIS — Z9189 Other specified personal risk factors, not elsewhere classified: Secondary | ICD-10-CM

## 2021-10-06 MED ORDER — VITAMIN D (ERGOCALCIFEROL) 1.25 MG (50000 UNIT) PO CAPS: 50000.0000 [IU] | ORAL_CAPSULE | ORAL | 0 refills | Status: DC

## 2021-10-06 NOTE — Progress Notes (Signed)
Chief Complaint:   OBESITY Kelly Perez is here to discuss her progress with her obesity treatment plan along with follow-up of her obesity related diagnoses. Kelly Perez is on following a lower carbohydrate, vegetable and lean protein rich diet plan and states she is following her eating plan approximately 95% of the time. Kelly Perez states she is walking the dog for 20-30 minutes 7 times per week.  Today's visit was #: 21 Starting weight: 372 lbs Starting date: 06/17/2020 Today's weight: 309 lbs Today's date: 10/06/2021 Total lbs lost to date: 63 Total lbs lost since last in-office visit: 3  Interim History: Kelly Perez continues to do well with weight loss, and she has even lost weight over Thanksgiving. Her hunger is mostly controlled and she is doing very well with avoiding gluten.  Subjective:   1. Vitamin D deficiency Kelly Perez is on Vit D, and her labs are at goal. She has no signs of over-replacement. I discussed labs with the patient today.  2. Type 2 diabetes mellitus with other specified complication, without long-term current use of insulin ([place]) Kelly Perez continues to do well with diet and weight loss. She is stable on metformin and she takes it with lunch to help minimize GI issues. I discussed labs with the patient today.  3. At risk for heart disease [name] is at a higher than average risk for cardiovascular disease due to obesity.   Assessment/Plan:   1. Vitamin D deficiency Low Vitamin D level contributes to fatigue and are associated with obesity, breast, and colon cancer. We will refill prescription Vitamin D 50,000 IU every week for 1 month. Kelly Perez will follow-up for routine testing of Vitamin D, at least 2-3 times per year to avoid over-replacement.  - Vitamin D, Ergocalciferol, (DRISDOL) 1.25 MG (50000 UNIT) CAPS capsule; Take 1 capsule (50,000 Units total) by mouth every 7 (seven) days.  Dispense: 4 capsule; Refill: 0  2. Type 2 diabetes mellitus with other specified complication,  without long-term current use of insulin (HCC) Kelly Perez will continue with diet, exercise, and metformin, and she will continue to follow up as directed. Good blood sugar control is important to decrease the likelihood of diabetic complications such as nephropathy, neuropathy, limb loss, blindness, coronary artery disease, and death. Intensive lifestyle modification including diet, exercise and weight loss are the first line of treatment for diabetes.   3. At risk for heart disease Kelly Perez was given approximately 15 minutes of coronary artery disease prevention counseling today. She is 55 y.o. female and has risk factors for heart disease including obesity. We discussed intensive lifestyle modifications today with an emphasis on specific weight loss instructions and strategies.   Repetitive spaced learning was employed today to elicit superior memory formation and behavioral change.  4. Obesity with current BMI 49.9 Kelly Perez is currently in the action stage of change. As such, her goal is to continue with weight loss efforts. She has agreed to following a lower carbohydrate, vegetable and lean protein rich diet plan.   Exercise goals: As is.  Behavioral modification strategies: increasing lean protein intake and meal planning and cooking strategies.  Kelly Perez has agreed to follow-up with our clinic in 4 weeks. She was informed of the importance of frequent follow-up visits to maximize her success with intensive lifestyle modifications for her multiple health conditions.   Objective:   Blood pressure 126/71, pulse (!) 56, temperature 98.2 F (36.8 C), height 5' 6"  (1.676 m), weight (!) 309 lb (140.2 kg), last menstrual period 02/13/2018, SpO2 98 %.  Body mass index is 49.87 kg/m.  General: Cooperative, alert, well developed, in no acute distress. HEENT: Conjunctivae and lids unremarkable. Cardiovascular: Regular rhythm.  Lungs: Normal work of breathing. Neurologic: No focal deficits.   Lab Results   Component Value Date   CREATININE 0.55 (L) 09/08/2021   BUN 14 09/08/2021   NA 140 09/08/2021   K 5.2 09/08/2021   CL 103 09/08/2021   CO2 23 09/08/2021   Lab Results  Component Value Date   ALT 12 09/08/2021   AST 9 09/08/2021   ALKPHOS 106 09/08/2021   BILITOT 0.3 09/08/2021   Lab Results  Component Value Date   HGBA1C 6.2 (H) 09/08/2021   HGBA1C 6.2 (H) 06/11/2021   HGBA1C 6.1 (H) 02/05/2021   HGBA1C 6.5 (H) 09/29/2020   HGBA1C 6.4 (H) 06/17/2020   Lab Results  Component Value Date   INSULIN 12.4 09/08/2021   INSULIN 14.9 06/11/2021   INSULIN 13.9 02/05/2021   INSULIN 15.2 09/29/2020   INSULIN 27.4 (H) 06/17/2020   Lab Results  Component Value Date   TSH 2.690 06/17/2020   Lab Results  Component Value Date   CHOL 186 09/08/2021   HDL 47 09/08/2021   LDLCALC 124 (H) 09/08/2021   TRIG 82 09/08/2021   CHOLHDL 4 06/22/2018   Lab Results  Component Value Date   VD25OH 59.3 09/08/2021   VD25OH 54.5 06/11/2021   VD25OH 41.0 02/05/2021   Lab Results  Component Value Date   WBC 9.8 09/29/2020   HGB 14.3 09/29/2020   HCT 43.4 09/29/2020   MCV 84 09/29/2020   PLT 436 09/29/2020   Lab Results  Component Value Date   IRON 82 01/26/2018   TIBC 391 01/26/2018   FERRITIN 35 01/26/2018   Attestation Statements:   Reviewed by clinician on day of visit: allergies, medications, problem list, medical history, surgical history, family history, social history, and previous encounter notes.   I, Trixie Dredge, am acting as transcriptionist for Dennard Nip, MD.  I have reviewed the above documentation for accuracy and completeness, and I agree with the above. -  Dennard Nip, MD

## 2021-10-27 ENCOUNTER — Other Ambulatory Visit (INDEPENDENT_AMBULATORY_CARE_PROVIDER_SITE_OTHER): Payer: Self-pay | Admitting: Family Medicine

## 2021-10-27 DIAGNOSIS — E1169 Type 2 diabetes mellitus with other specified complication: Secondary | ICD-10-CM

## 2021-10-27 NOTE — Telephone Encounter (Signed)
Dr.Beasley 

## 2021-10-27 NOTE — Telephone Encounter (Signed)
LAST APPOINTMENT DATE: 10/06/21 NEXT APPOINTMENT DATE: 11/05/21   [id number] [place], [place] - [address] [place] [id number]: [phone]: [phone] Patient is requesting a refill of the following medications: Requested Prescriptions   Pending Prescriptions Disp Refills   metFORMIN (GLUCOPHAGE) 500 MG tablet [Pharmacy Med Name: METFORMIN HCL 500 MG TABLET] 30 tablet 0    Sig: TAKE 1 TABLET (500 MG TOTAL) BY MOUTH DAILY AFTER LUNCH.    Date last filled: 09/08/21 Previously prescribed by Dr. BLeafy Ro Lab Results  Component Value Date   HGBA1C 6.2 (H) 09/08/2021   HGBA1C 6.2 (H) 06/11/2021   HGBA1C 6.1 (H) 02/05/2021   Lab Results  Component Value Date   LDLCALC 124 (H) 09/08/2021   CREATININE 0.55 (L) 09/08/2021   Lab Results  Component Value Date   VD25OH 59.3 09/08/2021   VD25OH 54.5 06/11/2021   VD25OH 41.0 02/05/2021    BP Readings from Last 3 Encounters:  10/06/21 126/71  09/08/21 121/69  08/06/21 133/79

## 2021-11-05 ENCOUNTER — Other Ambulatory Visit: Payer: Self-pay

## 2021-11-05 ENCOUNTER — Ambulatory Visit (INDEPENDENT_AMBULATORY_CARE_PROVIDER_SITE_OTHER): Payer: 59 | Admitting: Family Medicine

## 2021-11-05 ENCOUNTER — Encounter (INDEPENDENT_AMBULATORY_CARE_PROVIDER_SITE_OTHER): Payer: Self-pay | Admitting: Family Medicine

## 2021-11-05 VITALS — BP 114/70 | HR 57 | Temp 97.9°F | Ht 66.0 in | Wt 309.0 lb

## 2021-11-05 DIAGNOSIS — E1169 Type 2 diabetes mellitus with other specified complication: Secondary | ICD-10-CM | POA: Diagnosis not present

## 2021-11-05 DIAGNOSIS — Z9189 Other specified personal risk factors, not elsewhere classified: Secondary | ICD-10-CM | POA: Diagnosis not present

## 2021-11-05 DIAGNOSIS — Z6841 Body Mass Index (BMI) 40.0 and over, adult: Secondary | ICD-10-CM | POA: Diagnosis not present

## 2021-11-05 DIAGNOSIS — E559 Vitamin D deficiency, unspecified: Secondary | ICD-10-CM

## 2021-11-05 MED ORDER — VITAMIN D (ERGOCALCIFEROL) 1.25 MG (50000 UNIT) PO CAPS: 50000.0000 [IU] | ORAL_CAPSULE | ORAL | 0 refills | Status: DC

## 2021-11-09 NOTE — Progress Notes (Signed)
Chief Complaint:   OBESITY Kelly Perez is here to discuss her progress with her obesity treatment plan along with follow-up of her obesity related diagnoses. Kelly Perez is on following a lower carbohydrate, vegetable and lean protein rich diet plan and states she is following her eating plan approximately 90% of the time. Kelly Perez states she is walking the dog for 20 minutes 7 times per week.  Today's visit was #: 22 Starting weight: 372 lbs Starting date: 06/17/2020 Today's weight: 309 lbs Today's date: 11/05/2021 Total lbs lost to date: 63 Total lbs lost since last in-office visit: 0  Interim History: Kelly Perez did well avoiding holiday weight gain. She is working on Location manager, and sh is meeting her protein goals while avoiding gluten (she has celiac disease).  Subjective:   1. Vitamin D deficiency Kelly Perez is on Vit D, and she requests a refill. No side effects were noted.  2. Type 2 diabetes mellitus with other specified complication, without long-term current use of insulin (Kelly Perez) Kelly Perez is stable on metformin. She continues to do well with diet.  3. At risk for heart disease Kelly Perez is at a higher than average risk for cardiovascular disease due to obesity.   Assessment/Plan:   1. Vitamin D deficiency We will refill prescription Vitamin D for 1 month. Kelly Perez will follow-up for routine testing of Vitamin D, at least 2-3 times per year to avoid over-replacement.  - Vitamin D, Ergocalciferol, (DRISDOL) 1.25 MG (50000 UNIT) CAPS capsule; Take 1 capsule (50,000 Units total) by mouth every 7 (seven) days.  Dispense: 4 capsule; Refill: 0  2. Type 2 diabetes mellitus with other specified complication, without long-term current use of insulin ([facility]) Kelly Perez will continue to take metformin with food, and will continue to follow up as directed. Good blood sugar control is important to decrease the likelihood of diabetic complications such as nephropathy, neuropathy, limb loss,  blindness, coronary artery disease, and death. Intensive lifestyle modification including diet, exercise and weight loss are the first line of treatment for diabetes.   3. At risk for heart disease Kelly Perez was given approximately 15 minutes of coronary artery disease prevention counseling today. She is 55 y.o. female and has risk factors for heart disease including obesity. We discussed intensive lifestyle modifications today with an emphasis on specific weight loss instructions and strategies.   Repetitive spaced learning was employed today to elicit superior memory formation and behavioral change.  4. Obesity with current BMI 49.9 Kelly Perez is currently in the action stage of change. As such, her goal is to continue with weight loss efforts. She has agreed to following a lower carbohydrate, vegetable and lean protein rich diet plan.   Exercise goals: As is.  Behavioral modification strategies: increasing lean protein intake and meal planning and cooking strategies.  Kelly Perez has agreed to follow-up with our clinic in 4 weeks. She was informed of the importance of frequent follow-up visits to maximize her success with intensive lifestyle modifications for her multiple health conditions.   Objective:   Blood pressure 114/70, pulse (!) 57, temperature 97.9 F (36.6 C), temperature source Oral, height 5' 6"  (1.676 m), weight (!) 309 lb (140.2 kg), last menstrual period 02/13/2018, SpO2 97 %. Body mass index is 49.87 kg/m.  General: Cooperative, alert, well developed, in no acute distress. HEENT: Conjunctivae and lids unremarkable. Cardiovascular: Regular rhythm.  Lungs: Normal work of breathing. Neurologic: No focal deficits.   Lab Results  Component Value Date   CREATININE 0.55 (L) 09/08/2021  BUN 14 09/08/2021   NA 140 09/08/2021   K 5.2 09/08/2021   CL 103 09/08/2021   CO2 23 09/08/2021   Lab Results  Component Value Date   ALT 12 09/08/2021   AST 9 09/08/2021   ALKPHOS 106  09/08/2021   BILITOT 0.3 09/08/2021   Lab Results  Component Value Date   HGBA1C 6.2 (H) 09/08/2021   HGBA1C 6.2 (H) 06/11/2021   HGBA1C 6.1 (H) 02/05/2021   HGBA1C 6.5 (H) 09/29/2020   HGBA1C 6.4 (H) 06/17/2020   Lab Results  Component Value Date   INSULIN 12.4 09/08/2021   INSULIN 14.9 06/11/2021   INSULIN 13.9 02/05/2021   INSULIN 15.2 09/29/2020   INSULIN 27.4 (H) 06/17/2020   Lab Results  Component Value Date   TSH 2.690 06/17/2020   Lab Results  Component Value Date   CHOL 186 09/08/2021   HDL 47 09/08/2021   LDLCALC 124 (H) 09/08/2021   TRIG 82 09/08/2021   CHOLHDL 4 06/22/2018   Lab Results  Component Value Date   VD25OH 59.3 09/08/2021   VD25OH 54.5 06/11/2021   VD25OH 41.0 02/05/2021   Lab Results  Component Value Date   WBC 9.8 09/29/2020   HGB 14.3 09/29/2020   HCT 43.4 09/29/2020   MCV 84 09/29/2020   PLT 436 09/29/2020   Lab Results  Component Value Date   IRON 82 01/26/2018   TIBC 391 01/26/2018   FERRITIN 35 01/26/2018   Attestation Statements:   Reviewed by clinician on day of visit: allergies, medications, problem list, medical history, surgical history, family history, social history, and previous encounter notes.   I, Trixie Dredge, am acting as transcriptionist for Dennard Nip, MD.  I have reviewed the above documentation for accuracy and completeness, and I agree with the above. -  Dennard Nip, MD

## 2021-11-25 ENCOUNTER — Other Ambulatory Visit (INDEPENDENT_AMBULATORY_CARE_PROVIDER_SITE_OTHER): Payer: Self-pay | Admitting: Family Medicine

## 2021-11-25 DIAGNOSIS — E1169 Type 2 diabetes mellitus with other specified complication: Secondary | ICD-10-CM

## 2021-11-25 NOTE — Telephone Encounter (Signed)
Dr.Beasley 

## 2021-11-26 NOTE — Telephone Encounter (Signed)
LAST APPOINTMENT DATE: 11/05/21 NEXT APPOINTMENT DATE: 12/03/21   [id number] [place], [place] - [address] [place] [address]Phone: [phone]: [phone] Patient is requesting a refill of the following medications: Requested Prescriptions   Pending Prescriptions Disp Refills   metFORMIN (GLUCOPHAGE) 500 MG tablet [Pharmacy Med Name: METFORMIN HCL 500 MG TABLET] 30 tablet 0    Sig: TAKE 1 TABLET (500 MG TOTAL) BY MOUTH DAILY AFTER LUNCH.    Date last filled: 10/27/21 Previously prescribed by Dr. BLeafy Ro Lab Results  Component Value Date   HGBA1C 6.2 (H) 09/08/2021   HGBA1C 6.2 (H) 06/11/2021   HGBA1C 6.1 (H) 02/05/2021   Lab Results  Component Value Date   LDLCALC 124 (H) 09/08/2021   CREATININE 0.55 (L) 09/08/2021   Lab Results  Component Value Date   VD25OH 59.3 09/08/2021   VD25OH 54.5 06/11/2021   VD25OH 41.0 02/05/2021    BP Readings from Last 3 Encounters:  11/05/21 114/70  10/06/21 126/71  09/08/21 121/69   //

## 2021-12-01 ENCOUNTER — Encounter (INDEPENDENT_AMBULATORY_CARE_PROVIDER_SITE_OTHER): Payer: Self-pay | Admitting: Family Medicine

## 2021-12-03 ENCOUNTER — Encounter (INDEPENDENT_AMBULATORY_CARE_PROVIDER_SITE_OTHER): Payer: Self-pay | Admitting: Family Medicine

## 2021-12-03 ENCOUNTER — Telehealth (INDEPENDENT_AMBULATORY_CARE_PROVIDER_SITE_OTHER): Payer: 59 | Admitting: Family Medicine

## 2021-12-03 DIAGNOSIS — Z6841 Body Mass Index (BMI) 40.0 and over, adult: Secondary | ICD-10-CM

## 2021-12-03 DIAGNOSIS — U071 COVID-19: Secondary | ICD-10-CM | POA: Diagnosis not present

## 2021-12-03 DIAGNOSIS — E669 Obesity, unspecified: Secondary | ICD-10-CM

## 2021-12-03 DIAGNOSIS — E559 Vitamin D deficiency, unspecified: Secondary | ICD-10-CM

## 2021-12-03 MED ORDER — VITAMIN D (ERGOCALCIFEROL) 1.25 MG (50000 UNIT) PO CAPS: 50000.0000 [IU] | ORAL_CAPSULE | ORAL | 0 refills | Status: DC

## 2021-12-06 NOTE — Progress Notes (Signed)
TeleHealth Visit:  Due to the COVID-19 pandemic, this visit was completed with telemedicine (audio/video) technology to reduce patient and provider exposure as well as to preserve personal protective equipment.   Kelly Perez has verbally consented to this TeleHealth visit. The patient is located at home, the provider is located at the [facility] office. The participants in this visit include the listed provider and patient. The visit was conducted today via MyChart video.   Chief Complaint: OBESITY Kelly Perez is here to discuss her progress with her obesity treatment plan along with follow-up of her obesity related diagnoses. Kelly Perez is on following a lower carbohydrate, vegetable and lean protein rich diet plan and states she is following her eating plan approximately 75% of the time. Kelly Perez states she is walking the dog.  Today's visit was #: 23 Starting weight: 372 lbs Starting date: 06/17/2020  Interim History: Kelly Perez has been sick with COVID and her visit was changed to virtual. She hasn't been able to concentrate on her eating plan, but she is working on hydrating and starting to try to increase her protein today now that she is starting to feel better.  Subjective:   1. COVID Kelly Perez tested positive for COVID yesterday. She started Paxlovid yesterday and she is able to drink liquids now and eat some.  2. Vitamin D deficiency Kelly Perez is on Vitamin D, but her level is not yet at goal. She requests a refill today.   Assessment/Plan:   1. COVID Kelly Perez was encouraged to continue hydrating, and add solid foods as tolerated.she is to try to advance every 2-3 hours, but not fully exercise until she feels better.  2. Vitamin D deficiency We will refill prescription Vitamin D 50,000 IU every week for 1 month. Kelly Perez will follow-up for routine testing of Vitamin D, at least 2-3 times per year to avoid over-replacement.  - Vitamin D, Ergocalciferol, (DRISDOL) 1.25 MG (50000 UNIT) CAPS  capsule; Take 1 capsule (50,000 Units total) by mouth every 7 (seven) days.  Dispense: 4 capsule; Refill: 0  3. Obesity with current BMI 49.9 Kelly Perez is currently in the action stage of change. As such, her goal is to continue with weight loss efforts. She has agreed to practicing portion control and making smarter food choices, such as increasing vegetables and decreasing simple carbohydrates.   Kelly Perez is to advance her diet as tolerated and will increase her protein to goal as she is able.  Exercise goals: As is.  Behavioral modification strategies: increasing water intake.  Kelly Perez has agreed to follow-up with our clinic in 3 to 4 weeks. She was informed of the importance of frequent follow-up visits to maximize her success with intensive lifestyle modifications for her multiple health conditions.  Objective:   VITALS: Per patient if applicable, see vitals. GENERAL: Alert and in no acute distress. CARDIOPULMONARY: No increased WOB. Speaking in clear sentences.  PSYCH: Pleasant and cooperative. Speech normal rate and rhythm. Affect is appropriate. Insight and judgement are appropriate. Attention is focused, linear, and appropriate.  NEURO: Oriented as arrived to appointment on time with no prompting.   Lab Results  Component Value Date   CREATININE 0.55 (L) 09/08/2021   BUN 14 09/08/2021   NA 140 09/08/2021   K 5.2 09/08/2021   CL 103 09/08/2021   CO2 23 09/08/2021   Lab Results  Component Value Date   ALT 12 09/08/2021   AST 9 09/08/2021   ALKPHOS 106 09/08/2021   BILITOT 0.3 09/08/2021   Lab  Results  Component Value Date   HGBA1C 6.2 (H) 09/08/2021   HGBA1C 6.2 (H) 06/11/2021   HGBA1C 6.1 (H) 02/05/2021   HGBA1C 6.5 (H) 09/29/2020   HGBA1C 6.4 (H) 06/17/2020   Lab Results  Component Value Date   INSULIN 12.4 09/08/2021   INSULIN 14.9 06/11/2021   INSULIN 13.9 02/05/2021   INSULIN 15.2 09/29/2020   INSULIN 27.4 (H) 06/17/2020   Lab Results  Component Value Date    TSH 2.690 06/17/2020   Lab Results  Component Value Date   CHOL 186 09/08/2021   HDL 47 09/08/2021   LDLCALC 124 (H) 09/08/2021   TRIG 82 09/08/2021   CHOLHDL 4 06/22/2018   Lab Results  Component Value Date   VD25OH 59.3 09/08/2021   VD25OH 54.5 06/11/2021   VD25OH 41.0 02/05/2021   Lab Results  Component Value Date   WBC 9.8 09/29/2020   HGB 14.3 09/29/2020   HCT 43.4 09/29/2020   MCV 84 09/29/2020   PLT 436 09/29/2020   Lab Results  Component Value Date   IRON 82 01/26/2018   TIBC 391 01/26/2018   FERRITIN 35 01/26/2018    Attestation Statements:   Reviewed by clinician on day of visit: allergies, medications, problem list, medical history, surgical history, family history, social history, and previous encounter notes.   I, Trixie Dredge, am acting as transcriptionist for Dennard Nip, MD.  I have reviewed the above documentation for accuracy and completeness, and I agree with the above. - Dennard Nip, MD

## 2021-12-11 ENCOUNTER — Encounter: Payer: Self-pay | Admitting: Family Medicine

## 2021-12-11 ENCOUNTER — Other Ambulatory Visit: Payer: Self-pay

## 2021-12-11 ENCOUNTER — Ambulatory Visit (INDEPENDENT_AMBULATORY_CARE_PROVIDER_SITE_OTHER): Payer: 59 | Admitting: Family Medicine

## 2021-12-11 VITALS — BP 159/78 | HR 60 | Temp 98.0°F

## 2021-12-11 DIAGNOSIS — J0100 Acute maxillary sinusitis, unspecified: Secondary | ICD-10-CM | POA: Diagnosis not present

## 2021-12-11 DIAGNOSIS — U071 COVID-19: Secondary | ICD-10-CM | POA: Diagnosis not present

## 2021-12-11 MED ORDER — PREDNISONE 20 MG PO TABS: 40.0000 mg | ORAL_TABLET | Freq: Every day | ORAL | 0 refills | Status: DC

## 2021-12-11 NOTE — Progress Notes (Signed)
This visit occurred during the SARS-CoV-2 public health emergency.  Safety protocols were in place, including screening questions prior to the visit, additional usage of staff PPE, and extensive cleaning of exam room while observing appropriate contact time as indicated for disinfecting solutions.    Kelly Perez , 1966/07/17, 56 y.o., female MRN: [id number] Patient Care Team    Relationship Specialty Notifications Start End  Ma Hillock, DO PCP - General Family Medicine  07/06/21   Thornton Park, MD Consulting Physician Gastroenterology  07/08/21   Marin Comment, My [place], [place] Referring Physician Optometry  07/08/21   Starlyn Skeans, MD Consulting Physician Bariatrics  07/08/21     Chief Complaint  Patient presents with   Covid Positive    Pt tested pos 12/02/21; pt is currently taking abx after being seen by UC; pt c/o ear fullness and nasal congestion x 11 days;      Subjective: Pt presents for an OV with complaints of COVID-19 illness.  She reports she was in [place] for a business meeting and noticed symptoms on the return home on February 6.  She was seen at another clinic and started on Paxlovid, after testing positive for COVID on February 8.  She states she went on to have sinusitis symptoms and was then started on Augmentin on February 15.  She states this was prescribed twice daily x7 days.  She is using Flonase nasal spray daily and nasal rinse.  She states her ear still feel extremely full and her sinuses feel congested and uncomfortable.  She has had had sinus surgery in the past and has difficulty with sinus infections at times.  She denies any fevers or chills at this time.  Depression screen [facility] 2/9 07/06/2021 06/17/2020 01/26/2018  Decreased Interest 0 3 1  Down, Depressed, Hopeless 0 2 1  PHQ - 2 Score 0 5 2  Altered sleeping - 3 1  Tired, decreased energy - 3 1  Change in appetite - 2 1  Feeling bad or failure about yourself  - 1 1  Trouble concentrating - 1 1  Moving  slowly or fidgety/restless - 1 0  Suicidal thoughts - 0 0  PHQ-9 Score - 16 7  Difficult doing work/chores - Somewhat difficult Somewhat difficult    Allergies  Allergen Reactions   Neosporin [Neomycin-Bacitracin Zn-Polymyx] Rash   Tetracycline Anaphylaxis   Neomycin    Other     Glutin     Social History   Social History Narrative   Originally from [place]; lived in [place] x 2 years prior to moving to [place] in 2018 to be near only daughter and grandkids.   Lives alone with great Dane. Divorced.    Education/employment: Masters degree.  Employed as a Landscape architect.   Safety:      -smoke alarm in the home:Yes     - wears seatbelt: Yes     - Feels safe in their relationships: Yes      Past Medical History:  Diagnosis Date   Allergy    Anemia    Anxiety    Arthritis    Bilateral primary osteoarthritis of knee 01/26/2018   Celiac disease 01/26/2018   Chronic depression 01/26/2018   Constipation    Food allergy    Gallbladder problem    GERD (gastroesophageal reflux disease)    Hypertension    Morbid obesity ([place]) 01/26/2018   Osteoarthritis    PTSD (post-traumatic stress disorder)    Sleep apnea  cpap   Vitamin B12 deficiency    due to celiac   Vitamin D deficiency    Past Surgical History:  Procedure Laterality Date   CHOLECYSTECTOMY  2015   SINUS IRRIGATION     Family History  Problem Relation Age of Onset   Bipolar disorder Mother    Diabetes Mother    Hypertension Mother    Heart disease Mother    Depression Mother    Obesity Mother    COPD Father    Depression Father    Early death Father    Heart disease Father    Kidney disease Father    Cardiomyopathy Father    Arthritis Father    Hypertension Father    Bipolar disorder Sister    Diabetes Brother    Heart disease Brother    Diabetes Maternal Grandmother    Bipolar disorder Maternal Grandfather    Diabetes Paternal Grandmother    Diabetes Paternal Grandfather    Hypertension  Daughter    Depression Daughter    Diabetes Daughter    Rectal cancer Maternal Aunt    Stomach cancer Neg Hx    Liver disease Neg Hx    Colon cancer Neg Hx    Pancreatic cancer Neg Hx    Esophageal cancer Neg Hx    Allergies as of 12/11/2021       Reactions   Neosporin [neomycin-bacitracin Zn-polymyx] Rash   Tetracycline Anaphylaxis   Neomycin    Other    Glutin        Medication List        Accurate as of December 11, 2021  4:51 PM. If you have any questions, ask your nurse or doctor.          amoxicillin-clavulanate 875-125 MG tablet Commonly known as: AUGMENTIN Take 1 tablet by mouth 2 (two) times daily.   bisoprolol 10 MG tablet Commonly known as: ZEBETA Take 1 tablet (10 mg total) by mouth daily.   cetirizine 10 MG tablet Commonly known as: ZYRTEC Take 10 mg by mouth daily.   ezetimibe 10 MG tablet Commonly known as: Zetia Take 1 tablet (10 mg total) by mouth daily.   Iron 325 (65 Fe) MG Tabs Take 1 tablet by mouth daily.   Magnesium 400 MG Tabs Take 1 tablet by mouth daily.   metFORMIN 500 MG tablet Commonly known as: GLUCOPHAGE TAKE 1 TABLET (500 MG TOTAL) BY MOUTH DAILY AFTER LUNCH.   predniSONE 20 MG tablet Commonly known as: DELTASONE Take 2 tablets (40 mg total) by mouth daily with breakfast. Started by: Howard Pouch, DO   SUPER ENZYMES PO Take 1 tablet by mouth daily.   TURMERIC PO Take 1 tablet by mouth daily.   vitamin B-12 1000 MCG tablet Commonly known as: CYANOCOBALAMIN Take 3,000 mcg by mouth daily.   Vitamin D (Ergocalciferol) 1.25 MG (50000 UNIT) Caps capsule Commonly known as: DRISDOL Take 1 capsule (50,000 Units total) by mouth every 7 (seven) days.   Zinc 50 MG Tabs Take 1 tablet by mouth daily.        All past medical history, surgical history, allergies, family history, immunizations andmedications were updated in the EMR today and reviewed under the history and medication portions of their EMR.      ROS Negative, with the exception of above mentioned in HPI   Objective:  BP (!) 159/78    Pulse 60    Temp 98 F (36.7 C) (Oral)    LMP 02/13/2018  SpO2 96%  There is no height or weight on file to calculate BMI. Physical Exam Vitals and nursing note reviewed.  Constitutional:      General: She is not in acute distress.    Appearance: Normal appearance. She is not ill-appearing, toxic-appearing or diaphoretic.  HENT:     Head: Normocephalic and atraumatic.     Right Ear: Tympanic membrane, ear canal and external ear normal. There is no impacted cerumen.     Left Ear: Tympanic membrane, ear canal and external ear normal. There is no impacted cerumen.     Nose: Congestion and rhinorrhea present.     Mouth/Throat:     Mouth: Mucous membranes are moist.     Pharynx: No oropharyngeal exudate or posterior oropharyngeal erythema.  Eyes:     General: No scleral icterus.       Right eye: No discharge.        Left eye: No discharge.     Extraocular Movements: Extraocular movements intact.     Conjunctiva/sclera: Conjunctivae normal.     Pupils: Pupils are equal, round, and reactive to light.  Cardiovascular:     Rate and Rhythm: Normal rate and regular rhythm.  Pulmonary:     Effort: Pulmonary effort is normal. No respiratory distress.     Breath sounds: Normal breath sounds. No wheezing, rhonchi or rales.  Musculoskeletal:     Cervical back: Neck supple. No tenderness.  Lymphadenopathy:     Cervical: No cervical adenopathy.  Skin:    General: Skin is warm and dry.     Coloration: Skin is not jaundiced or pale.     Findings: No erythema or rash.  Neurological:     Mental Status: She is alert and oriented to person, place, and time. Mental status is at baseline.     Motor: No weakness.     Gait: Gait normal.  Psychiatric:        Mood and Affect: Mood normal.        Behavior: Behavior normal.        Thought Content: Thought content normal.        Judgment: Judgment normal.      No results found. No results found. No results found for this or any previous visit (from the past 24 hour(s)).  Assessment/Plan: Khristen Cheyney is a 56 y.o. female present for OV for  COVID-19/Acute non-recurrent maxillary sinusitis Rest, hydrate.  +/- flonase, mucinex (DM if cough), nettie pot or nasal saline.  Continue Augmentin as prescribed Start prednisone taper F/U 2 weeks if not improved.   Reviewed expectations re: course of current medical issues. Discussed self-management of symptoms. Outlined signs and symptoms indicating need for more acute intervention. Patient verbalized understanding and all questions were answered. Patient received an After-Visit Summary.    No orders of the defined types were placed in this encounter.  Meds ordered this encounter  Medications   predniSONE (DELTASONE) 20 MG tablet    Sig: Take 2 tablets (40 mg total) by mouth daily with breakfast.    Dispense:  10 tablet    Refill:  0   Referral Orders  No referral(s) requested today     Note is dictated utilizing voice recognition software. Although note has been proof read prior to signing, occasional typographical errors still can be missed. If any questions arise, please do not hesitate to call for verification.   electronically signed by:  Howard Pouch, DO  [place]

## 2021-12-24 ENCOUNTER — Other Ambulatory Visit (INDEPENDENT_AMBULATORY_CARE_PROVIDER_SITE_OTHER): Payer: Self-pay | Admitting: Family Medicine

## 2021-12-24 ENCOUNTER — Encounter (INDEPENDENT_AMBULATORY_CARE_PROVIDER_SITE_OTHER): Payer: Self-pay

## 2021-12-24 DIAGNOSIS — E1169 Type 2 diabetes mellitus with other specified complication: Secondary | ICD-10-CM

## 2021-12-24 NOTE — Telephone Encounter (Signed)
Sent pt a message via Mychart. ?

## 2021-12-24 NOTE — Telephone Encounter (Signed)
Dr.Beasley 

## 2022-01-05 ENCOUNTER — Other Ambulatory Visit: Payer: Self-pay

## 2022-01-05 ENCOUNTER — Encounter (INDEPENDENT_AMBULATORY_CARE_PROVIDER_SITE_OTHER): Payer: Self-pay | Admitting: Family Medicine

## 2022-01-05 ENCOUNTER — Ambulatory Visit (INDEPENDENT_AMBULATORY_CARE_PROVIDER_SITE_OTHER): Payer: 59 | Admitting: Family Medicine

## 2022-01-05 VITALS — BP 120/74 | HR 55 | Temp 98.1°F | Ht 66.0 in | Wt 304.0 lb

## 2022-01-05 DIAGNOSIS — E669 Obesity, unspecified: Secondary | ICD-10-CM

## 2022-01-05 DIAGNOSIS — Z7984 Long term (current) use of oral hypoglycemic drugs: Secondary | ICD-10-CM

## 2022-01-05 DIAGNOSIS — E1169 Type 2 diabetes mellitus with other specified complication: Secondary | ICD-10-CM

## 2022-01-05 DIAGNOSIS — Z6841 Body Mass Index (BMI) 40.0 and over, adult: Secondary | ICD-10-CM | POA: Diagnosis not present

## 2022-01-05 DIAGNOSIS — Z9189 Other specified personal risk factors, not elsewhere classified: Secondary | ICD-10-CM

## 2022-01-05 DIAGNOSIS — E559 Vitamin D deficiency, unspecified: Secondary | ICD-10-CM

## 2022-01-05 MED ORDER — VITAMIN D (ERGOCALCIFEROL) 1.25 MG (50000 UNIT) PO CAPS: 50000.0000 [IU] | ORAL_CAPSULE | ORAL | 0 refills | Status: DC

## 2022-01-05 MED ORDER — METFORMIN HCL 500 MG PO TABS: 500.0000 mg | ORAL_TABLET | Freq: Every day | ORAL | 0 refills | Status: DC

## 2022-01-06 ENCOUNTER — Encounter: Payer: Self-pay | Admitting: Family Medicine

## 2022-01-06 ENCOUNTER — Ambulatory Visit (INDEPENDENT_AMBULATORY_CARE_PROVIDER_SITE_OTHER): Payer: 59 | Admitting: Family Medicine

## 2022-01-06 VITALS — BP 130/71 | HR 56 | Temp 98.1°F | Ht 65.75 in | Wt 306.0 lb

## 2022-01-06 DIAGNOSIS — Z23 Encounter for immunization: Secondary | ICD-10-CM

## 2022-01-06 DIAGNOSIS — I10 Essential (primary) hypertension: Secondary | ICD-10-CM

## 2022-01-06 DIAGNOSIS — E538 Deficiency of other specified B group vitamins: Secondary | ICD-10-CM | POA: Diagnosis not present

## 2022-01-06 DIAGNOSIS — Z0001 Encounter for general adult medical examination with abnormal findings: Secondary | ICD-10-CM

## 2022-01-06 DIAGNOSIS — Z1231 Encounter for screening mammogram for malignant neoplasm of breast: Secondary | ICD-10-CM

## 2022-01-06 DIAGNOSIS — E559 Vitamin D deficiency, unspecified: Secondary | ICD-10-CM | POA: Diagnosis not present

## 2022-01-06 DIAGNOSIS — L309 Dermatitis, unspecified: Secondary | ICD-10-CM | POA: Insufficient documentation

## 2022-01-06 DIAGNOSIS — E1169 Type 2 diabetes mellitus with other specified complication: Secondary | ICD-10-CM | POA: Diagnosis not present

## 2022-01-06 DIAGNOSIS — E785 Hyperlipidemia, unspecified: Secondary | ICD-10-CM

## 2022-01-06 DIAGNOSIS — Z6841 Body Mass Index (BMI) 40.0 and over, adult: Secondary | ICD-10-CM

## 2022-01-06 DIAGNOSIS — W57XXXA Bitten or stung by nonvenomous insect and other nonvenomous arthropods, initial encounter: Secondary | ICD-10-CM | POA: Insufficient documentation

## 2022-01-06 DIAGNOSIS — Z8249 Family history of ischemic heart disease and other diseases of the circulatory system: Secondary | ICD-10-CM

## 2022-01-06 LAB — HEMOGLOBIN A1C: Hgb A1c MFr Bld: 6.4 % (ref 4.6–6.5)

## 2022-01-06 LAB — TSH: TSH: 2.26 u[IU]/mL (ref 0.35–5.50)

## 2022-01-06 MED ORDER — TRIAMCINOLONE ACETONIDE 0.1 % EX CREA: 1.0000 "application " | TOPICAL_CREAM | Freq: Two times a day (BID) | CUTANEOUS | 1 refills | Status: DC

## 2022-01-06 MED ORDER — MUPIROCIN CALCIUM 2 % EX CREA: 1.0000 "application " | TOPICAL_CREAM | Freq: Two times a day (BID) | CUTANEOUS | 0 refills | Status: DC

## 2022-01-06 MED ORDER — EZETIMIBE 10 MG PO TABS
10.0000 mg | ORAL_TABLET | Freq: Every day | ORAL | 3 refills | Status: DC
Start: 2022-01-06 — End: 2022-06-08

## 2022-01-06 NOTE — Progress Notes (Signed)
? ?This visit occurred during the SARS-CoV-2 public health emergency.  Safety protocols were in place, including screening questions prior to the visit, additional usage of staff PPE, and extensive cleaning of exam room while observing appropriate contact time as indicated for disinfecting solutions.  ? ? ?Patient ID: Kelly Perez, female  DOB: [birth year], 56 y.o.   MRN: [id number] ?Patient Care Team  ?  Relationship Specialty Notifications Start End  ?Kelly Hillock, DO PCP - General Family Medicine  07/06/21   ?Thornton Park, MD Consulting Physician Gastroenterology  07/08/21   ?Kelly Perez My [place], [place] Referring Physician Optometry  07/08/21   ?Kelly Skeans, MD Consulting Physician Bariatrics  07/08/21   ? ? ?Chief Complaint  ?Patient presents with  ? Annual Exam  ?  Pt is not fasting  ? ? ?Subjective: ?Kelly Perez is a 56 y.o.  Female  present for CPE/cmc ?All past medical history, surgical history, allergies, family history, immunizations, medications and social history were updated in the electronic medical record today. ?All recent labs, ED visits and hospitalizations within the last year were reviewed. ? ?Health maintenance:  ?Colonoscopy: completed reported 2011. Dr. Tarri Glenn - will schedule ?Mammogram: completed:04/28/2021- [facility]. > ordered placed  ?Cervical cancer screening: last pap: 2016, PCP in [place]. Due for PAP - schedule ?Immunizations: tdap utd 2016, Influenza decliend (encouraged yearly), shingrix declined, covid counseled, PNA20 ?Infectious disease screening: HIV declined, Hep C completed ?DEXA: routine screen ?Patient has a Dental home. ?Hospitalizations/ED visits: reviewed ? ?Hyperlipidemia associated with type 2 diabetes mellitus ([place]) ?Diabetes has been managed by her weight management team. ?She currently is prescribed metformin 500 mg daily.  Reports she has not been able to tolerate higher doses. ?Urine microalbumin up-to-date 08/2021 ?Pneumonia vaccination provided today ?Influenza  vaccination declined ?Eye exam completed 06/2021- requested records ?Foot exam completed ?  ?Essential hypertension/lipidemia/morbid obesity/family history of heart disease ?Pt reports compliance with bisoprolol 10 mg daily.. Blood pressures ranges at home not routinely checked.Patient denies chest pain, shortness of breath, dizziness or lower extremity edema.  ?. Pt is not taking a daily baby ASA. Pt is not prescribed statin (declined - does not like idea of statin). ?She is a patient of weight management. ?RF: Hypertension, hyperlipidemia, obesity, diabetes, family history of heart disease ?Depression screen [facility] 2/9 01/06/2022 07/06/2021 06/17/2020 01/26/2018  ?Decreased Interest 1 0 3 1  ?Down, Depressed, Hopeless 1 0 2 1  ?PHQ - 2 Score 2 0 5 2  ?Altered sleeping - - 3 1  ?Tired, decreased energy - - 3 1  ?Change in appetite - - 2 1  ?Feeling bad or failure about yourself  - - 1 1  ?Trouble concentrating - - 1 1  ?Moving slowly or fidgety/restless - - 1 0  ?Suicidal thoughts - - 0 0  ?PHQ-9 Score - - 16 7  ?Difficult doing work/chores - - Somewhat difficult Somewhat difficult  ? ?GAD 7 : Generalized Anxiety Score 01/26/2018  ?Nervous, Anxious, on Edge 1  ?Control/stop worrying 1  ?Worry too much - different things 1  ?Trouble relaxing 1  ?Restless 1  ?Easily annoyed or irritable 2  ?Afraid - awful might happen 1  ?Total GAD 7 Score 8  ?Anxiety Difficulty Somewhat difficult  ? ? ?Immunization History  ?Administered Date(s) Administered  ? Influenza,inj,Quad PF,6+ Mos 06/22/2018  ? PFIZER(Purple Top)SARS-COV-2 Vaccination 02/11/2020, 03/03/2020  ? ? ?Past Medical History:  ?Diagnosis Date  ? Allergy   ? Anemia   ? Anxiety   ? Arthritis   ?  Bilateral primary osteoarthritis of knee 01/26/2018  ? Celiac disease 01/26/2018  ? Chronic depression 01/26/2018  ? Constipation   ? Food allergy   ? Gallbladder problem   ? GERD (gastroesophageal reflux disease)   ? Hypertension   ? Morbid obesity ([place]) 01/26/2018  ? Osteoarthritis    ? PTSD (post-traumatic stress disorder)   ? Sleep apnea   ? cpap  ? Vitamin B12 deficiency   ? due to celiac  ? Vitamin D deficiency   ? ?Allergies  ?Allergen Reactions  ? Neosporin [Neomycin-Bacitracin Zn-Polymyx] Rash  ? Tetracycline Anaphylaxis  ? Neomycin   ? Other   ?  Glutin ? ?  ? ?Past Surgical History:  ?Procedure Laterality Date  ? CHOLECYSTECTOMY  2015  ? SINUS IRRIGATION    ? ?Family History  ?Problem Relation Age of Onset  ? Bipolar disorder Mother   ? Diabetes Mother   ? Hypertension Mother   ? Heart disease Mother   ? Depression Mother   ? Obesity Mother   ? COPD Father   ? Depression Father   ? Early death Father   ? Heart disease Father   ? Kidney disease Father   ? Cardiomyopathy Father   ? Arthritis Father   ? Hypertension Father   ? Bipolar disorder Sister   ? Diabetes Brother   ? Heart disease Brother   ? Diabetes Maternal Grandmother   ? Bipolar disorder Maternal Grandfather   ? Diabetes Paternal Grandmother   ? Diabetes Paternal Grandfather   ? Hypertension Daughter   ? Depression Daughter   ? Diabetes Daughter   ? Rectal cancer Maternal Aunt   ? Stomach cancer Neg Hx   ? Liver disease Neg Hx   ? Colon cancer Neg Hx   ? Pancreatic cancer Neg Hx   ? Esophageal cancer Neg Hx   ? ?Social History  ? ?Social History Narrative  ? Originally from [place]; lived in [place] x 2 years prior to moving to [place] in 2018 to be near only daughter and grandkids.  ? Lives alone with great Dane. Divorced.   ? Education/employment: Masters degree.  Employed as a Landscape architect.  ? Safety:   ?   -smoke alarm in the home:Yes  ?   - wears seatbelt: Yes  ?   - Feels safe in their relationships: Yes  ?   ? ? ?Allergies as of 01/06/2022   ? ?   Reactions  ? Neosporin [neomycin-bacitracin Zn-polymyx] Rash  ? Tetracycline Anaphylaxis  ? Neomycin   ? Other   ? Glutin  ? ?  ? ?  ?Medication List  ?  ? ?  ? Accurate as of January 06, 2022  9:05 AM. If you have any questions, ask your nurse or doctor.  ?  ?  ? ?   ? ?bisoprolol 10 MG tablet ?Commonly known as: ZEBETA ?Take 1 tablet (10 mg total) by mouth daily. ?  ?cetirizine 10 MG tablet ?Commonly known as: ZYRTEC ?Take 10 mg by mouth daily. ?  ?ezetimibe 10 MG tablet ?Commonly known as: Zetia ?Take 1 tablet (10 mg total) by mouth daily. ?  ?Iron 325 (65 Fe) MG Tabs ?Take 1 tablet by mouth daily. ?  ?Magnesium 400 MG Tabs ?Take 1 tablet by mouth daily. ?  ?metFORMIN 500 MG tablet ?Commonly known as: GLUCOPHAGE ?Take 1 tablet (500 mg total) by mouth daily after lunch. ?  ?SUPER ENZYMES PO ?Take 1 tablet by mouth daily. ?  ?  TURMERIC PO ?Take 1 tablet by mouth daily. ?  ?vitamin B-12 1000 MCG tablet ?Commonly known as: CYANOCOBALAMIN ?Take 3,000 mcg by mouth daily. ?  ?Vitamin D (Ergocalciferol) 1.25 MG (50000 UNIT) Caps capsule ?Commonly known as: DRISDOL ?Take 1 capsule (50,000 Units total) by mouth every 7 (seven) days. ?  ?Zinc 50 MG Tabs ?Take 1 tablet by mouth daily. ?  ? ?  ? ? ?All past medical history, surgical history, allergies, family history, immunizations andmedications were updated in the EMR today and reviewed under the history and medication portions of their EMR.    ? ?No results found for this or any previous visit (from the past 2160 hour(s)). ? ?US Abdomen Complete ? ?Result Date: 03/24/2021 ?CLINICAL DATA:  Elevated liver function test. EXAM: ABDOMEN ULTRASOUND COMPLETE COMPARISON:  No prior. FINDINGS: Gallbladder: Cholecystectomy. Common bile duct: Diameter: 3.5 mm Liver: Heterogeneous echogenic hepatic parenchymal pattern consistent fatty infiltration or hepatocellular disease. No focal hepatic abnormality identified. Portal vein is patent on color Doppler imaging with normal direction of blood flow towards the liver. IVC: No abnormality visualized. Pancreas: Visualized portion unremarkable. Spleen: Size and appearance within normal limits. Right Kidney: Length: 13.2 cm. Echogenicity within normal limits. No mass or hydronephrosis visualized. Left  Kidney: Length: 14.1 cm. Echogenicity within normal limits. 1.7 cm simple appearing cyst, possibly a parapelvic cyst. No hydronephrosis visualized. Abdominal aorta: No aneurysm visualized. Other findings: None. IMPRE

## 2022-01-06 NOTE — Patient Instructions (Signed)

## 2022-01-07 ENCOUNTER — Other Ambulatory Visit (INDEPENDENT_AMBULATORY_CARE_PROVIDER_SITE_OTHER): Payer: Self-pay | Admitting: Family Medicine

## 2022-01-11 ENCOUNTER — Encounter: Payer: Self-pay | Admitting: Family Medicine

## 2022-01-11 NOTE — Telephone Encounter (Signed)
?  How to Treat Normal Immunization Reactions ?Local Reactions at Injection Site:  ? Cold Pack: For initial pain or swelling at the injection site, apply a cold pack to the area for 20  ?minutes each as needed. After 48 hours, if pain still present switch to warm compress or heating pad.   ? Pain Medicine: acetaminophen (e.g., Tylenol) or ibuprofen (e.g., Advil) as long as patient is not allergic to the above or has been told to avoid one of the above secondary to liver or kidney disease. ? Localized Hives: Apply 1% hydrocortisone cream (OTC) once or twice.  ?Fever: ? For fevers above 102? F (39? C), acetaminophen or ibuprofen. If younger than 6 months, avoid  ?ibuprofen. Always give ibuprofen with food.  ? For all fevers: extra fluids.  ?General Reactions:  ? Usually due to a sore injection site.  ?- Decreased appetite and activity level can also occur, as well as feeling fatigued ? These symptoms do not need any treatment and will usually resolve in 24 to 72 hours. ? ?

## 2022-01-11 NOTE — Progress Notes (Signed)
? ? ? ?Chief Complaint:  ? ?OBESITY ?Kelly Perez is here to discuss her progress with her obesity treatment plan along with follow-up of her obesity related diagnoses. Kelly Perez is on practicing portion control and making smarter food choices, such as increasing vegetables and decreasing simple carbohydrates and states she is following her eating plan approximately 85% of the time. Kelly Perez states she is walking the dog for 20-30 minutes 7 times per week. ? ?Today's visit was #: 24 ?Starting weight: 372 lbs ?Starting date: 06/17/2020 ?Today's weight: 304 lbs ?Today's date: 01/05/2022 ?Total lbs lost to date: 69 ?Total lbs lost since last in-office visit: 5 ? ?Interim History: Kelly Perez continues to work on weight loss. Her appetite has decreased since having COVID and she is on prednisone for sinusitis. She is trying to not skip meals, but she is doing more portion control and making smarter choices.  ? ?Subjective:  ? ?1. Type 2 diabetes mellitus with other specified complication, without long-term current use of insulin (Kelly Perez) ?Kelly Perez continues to work on diet and weight loss. She is due for labs next month. ? ?2. Vitamin D deficiency ?Kelly Perez is on Vit D, and she denies nausea, vomiting, or muscle weakness.  ? ?3. At risk for impaired metabolic function ?Kelly Perez is at increased risk for impaired metabolic function if calories or protein is too low.  ? ?Assessment/Plan:  ? ?1. Type 2 diabetes mellitus with other specified complication, without long-term current use of insulin ([place]) ?We will refill metformin for 1 month, and we will recheck labs in 1 months. Good blood sugar control is important to decrease the likelihood of diabetic complications such as nephropathy, neuropathy, limb loss, blindness, coronary artery disease, and death. Intensive lifestyle modification including diet, exercise and weight loss are the first line of treatment for diabetes.  ? ?- metFORMIN (GLUCOPHAGE) 500 MG tablet; Take 1 tablet (500 mg total) by mouth  daily after lunch.  Dispense: 30 tablet; Refill: 0 ? ?2. Vitamin D deficiency ?We will refill prescription Vitamin D for 1 month, and we will recheck labs in 1 month. Kelly Perez will follow-up for routine testing of Vitamin D, at least 2-3 times per year to avoid over-replacement. ? ?- Vitamin D, Ergocalciferol, (DRISDOL) 1.25 MG (50000 UNIT) CAPS capsule; Take 1 capsule (50,000 Units total) by mouth every 7 (seven) days.  Dispense: 4 capsule; Refill: 0 ? ?3. At risk for impaired metabolic function ?Kelly Perez was given approximately 15 minutes of impaired  metabolic function prevention counseling today. We discussed intensive lifestyle modifications today with an emphasis on specific nutrition and exercise instructions and strategies.  ? ?Repetitive spaced learning was employed today to elicit superior memory formation and behavioral change. ? ?4. Obesity with current BMI 49.1 ?Kelly Perez is currently in the action stage of change. As such, her goal is to continue with weight loss efforts. She has agreed to change back to following a lower carbohydrate, vegetable and lean protein rich diet plan.  ? ?Exercise goals: As is. ? ?Behavioral modification strategies: increasing lean protein intake, no skipping meals, and meal planning and cooking strategies. ? ?Kelly Perez has agreed to follow-up with our clinic in 4 weeks. She was informed of the importance of frequent follow-up visits to maximize her success with intensive lifestyle modifications for her multiple health conditions.  ? ?Objective:  ? ?Blood pressure 120/74, pulse (!) 55, temperature 98.1 ?F (36.7 ?C), height 5' 6"  (1.676 m), weight (!) 304 lb (137.9 kg), last menstrual period 02/13/2018, SpO2 99 %. ?Body mass index is  49.07 kg/m?. ? ?General: Cooperative, alert, well developed, in no acute distress. ?HEENT: Conjunctivae and lids unremarkable. ?Cardiovascular: Regular rhythm.  ?Lungs: Normal work of breathing. ?Neurologic: No focal deficits.  ? ?Lab Results  ?Component Value  Date  ? CREATININE 0.55 (L) 09/08/2021  ? BUN 14 09/08/2021  ? NA 140 09/08/2021  ? K 5.2 09/08/2021  ? CL 103 09/08/2021  ? CO2 23 09/08/2021  ? ?Lab Results  ?Component Value Date  ? ALT 12 09/08/2021  ? AST 9 09/08/2021  ? ALKPHOS 106 09/08/2021  ? BILITOT 0.3 09/08/2021  ? ?Lab Results  ?Component Value Date  ? HGBA1C 6.4 01/06/2022  ? HGBA1C 6.2 (H) 09/08/2021  ? HGBA1C 6.2 (H) 06/11/2021  ? HGBA1C 6.1 (H) 02/05/2021  ? HGBA1C 6.5 (H) 09/29/2020  ? ?Lab Results  ?Component Value Date  ? INSULIN 12.4 09/08/2021  ? INSULIN 14.9 06/11/2021  ? INSULIN 13.9 02/05/2021  ? INSULIN 15.2 09/29/2020  ? INSULIN 27.4 (H) 06/17/2020  ? ?Lab Results  ?Component Value Date  ? TSH 2.26 01/06/2022  ? ?Lab Results  ?Component Value Date  ? CHOL 186 09/08/2021  ? HDL 47 09/08/2021  ? LDLCALC 124 (H) 09/08/2021  ? TRIG 82 09/08/2021  ? CHOLHDL 4 06/22/2018  ? ?Lab Results  ?Component Value Date  ? VD25OH 59.3 09/08/2021  ? VD25OH 54.5 06/11/2021  ? VD25OH 41.0 02/05/2021  ? ?Lab Results  ?Component Value Date  ? WBC 9.8 09/29/2020  ? HGB 14.3 09/29/2020  ? HCT 43.4 09/29/2020  ? MCV 84 09/29/2020  ? PLT 436 09/29/2020  ? ?Lab Results  ?Component Value Date  ? IRON 82 01/26/2018  ? TIBC 391 01/26/2018  ? FERRITIN 35 01/26/2018  ? ?Attestation Statements:  ? ?Reviewed by clinician on day of visit: allergies, medications, problem list, medical history, surgical history, family history, social history, and previous encounter notes. ? ? ?I, Trixie Dredge, am acting as transcriptionist for Dennard Nip, MD. ? ?I have reviewed the above documentation for accuracy and completeness, and I agree with the above. -  Dennard Nip, MD ? ? ?

## 2022-01-11 NOTE — Telephone Encounter (Signed)
Please advise 

## 2022-02-04 ENCOUNTER — Encounter (INDEPENDENT_AMBULATORY_CARE_PROVIDER_SITE_OTHER): Payer: Self-pay | Admitting: Family Medicine

## 2022-02-04 ENCOUNTER — Ambulatory Visit (INDEPENDENT_AMBULATORY_CARE_PROVIDER_SITE_OTHER): Payer: 59 | Admitting: Family Medicine

## 2022-02-04 VITALS — BP 100/63 | HR 58 | Temp 98.0°F | Ht 67.0 in | Wt 306.0 lb

## 2022-02-04 DIAGNOSIS — E1169 Type 2 diabetes mellitus with other specified complication: Secondary | ICD-10-CM | POA: Diagnosis not present

## 2022-02-04 DIAGNOSIS — E669 Obesity, unspecified: Secondary | ICD-10-CM

## 2022-02-04 DIAGNOSIS — Z7984 Long term (current) use of oral hypoglycemic drugs: Secondary | ICD-10-CM

## 2022-02-04 DIAGNOSIS — Z9189 Other specified personal risk factors, not elsewhere classified: Secondary | ICD-10-CM

## 2022-02-04 DIAGNOSIS — E559 Vitamin D deficiency, unspecified: Secondary | ICD-10-CM

## 2022-02-04 DIAGNOSIS — Z6841 Body Mass Index (BMI) 40.0 and over, adult: Secondary | ICD-10-CM

## 2022-02-04 MED ORDER — METFORMIN HCL 500 MG PO TABS: 500.0000 mg | ORAL_TABLET | Freq: Every day | ORAL | 0 refills | Status: DC

## 2022-02-04 MED ORDER — VITAMIN D (ERGOCALCIFEROL) 1.25 MG (50000 UNIT) PO CAPS: 50000.0000 [IU] | ORAL_CAPSULE | ORAL | 0 refills | Status: DC

## 2022-02-05 LAB — VITAMIN D 25 HYDROXY (VIT D DEFICIENCY, FRACTURES): Vit D, 25-Hydroxy: 56 ng/mL (ref 30.0–100.0)

## 2022-02-08 ENCOUNTER — Other Ambulatory Visit: Payer: Self-pay | Admitting: Family Medicine

## 2022-02-16 NOTE — Progress Notes (Signed)
? ? ? ?Chief Complaint:  ? ?OBESITY ?Kelly Perez is here to discuss her progress with her obesity treatment plan along with follow-up of her obesity related diagnoses. Maurina is on following a lower carbohydrate, vegetable and lean protein rich diet plan and states she is following her eating plan approximately 90% of the time. Senya states she is walking the dog for 35-40 minutes 7 times per week. ? ?Today's visit was #: 25 ?Starting weight: 372 lbs ?Starting date: 06/17/2020 ?Today's weight: 306 lbs ?Today's date: 02/04/2022 ?Total lbs lost to date: 74 ?Total lbs lost since last in-office visit: 0 ? ?Interim History:  ? ?Subjective:  ? ?1. Type 2 diabetes mellitus with other specified complication, without long-term current use of insulin ([place]) ?Channon continues to work on diet and decreasing simple carbohydrates. She is active and takes a walk daily.  ? ?2. Vitamin D deficiency ?Rennae is on Vitamin D, and her last level was at goal, but she is at high risk of over-replacement.  ? ?3. At risk for heart disease ?Aslyn is at higher than average risk for cardiovascular disease due to obesity. ? ?Assessment/Plan:  ? ?1. Type 2 diabetes mellitus with other specified complication, without long-term current use of insulin ([place]) ?Giavanni will continue metformin 500 mg, and we will refill for 1 month.  ? ?- metFORMIN (GLUCOPHAGE) 500 MG tablet; Take 1 tablet (500 mg total) by mouth daily after lunch.  Dispense: 30 tablet; Refill: 0 ? ?2. Vitamin D deficiency ?We will check labs today, and we will refill prescription Vitamin D for 1 month. Trenace will follow-up for routine testing of Vitamin D, at least 2-3 times per year to avoid over-replacement. ? ?- Vitamin D, Ergocalciferol, (DRISDOL) 1.25 MG (50000 UNIT) CAPS capsule; Take 1 capsule (50,000 Units total) by mouth every 7 (seven) days.  Dispense: 4 capsule; Refill: 0 ?- VITAMIN D 25 Hydroxy (Vit-D Deficiency, Fractures) ? ?3. At risk for heart disease ?Bernyce was given approximately  15 minutes of coronary artery disease prevention counseling today. She is 56 y.o. female and has risk factors for heart disease including obesity. We discussed intensive lifestyle modifications today with an emphasis on specific weight loss instructions and strategies. ? ?Repetitive spaced learning was employed today to elicit superior memory formation and behavioral change.  ? ?4. Obesity with current BMI 49.5 ?Faustine is currently in the action stage of change. As such, her goal is to continue with weight loss efforts. She has agreed to practicing portion control and making smarter food choices, such as increasing vegetables and decreasing simple carbohydrates.  ? ?Exercise goals: As is, and increase strengthening.  ? ?Behavioral modification strategies: increasing lean protein intake and meal planning and cooking strategies. ? ?Britain has agreed to follow-up with our clinic in 4 weeks. She was informed of the importance of frequent follow-up visits to maximize her success with intensive lifestyle modifications for her multiple health conditions.  ? ?Calla was informed we would discuss her lab results at her next visit unless there is a critical issue that needs to be addressed sooner. Kristiane agreed to keep her next visit at the agreed upon time to discuss these results. ? ?Objective:  ? ?Blood pressure 100/63, pulse (!) 58, temperature 98 ?F (36.7 ?C), height 5' 7"  (1.702 m), weight (!) 306 lb (138.8 kg), last menstrual period 02/13/2018, SpO2 98 %. ?Body mass index is 47.93 kg/m?. ? ?General: Cooperative, alert, well developed, in no acute distress. ?HEENT: Conjunctivae and lids unremarkable. ?Cardiovascular: Regular rhythm.  ?  Lungs: Normal work of breathing. ?Neurologic: No focal deficits.  ? ?Lab Results  ?Component Value Date  ? CREATININE 0.55 (L) 09/08/2021  ? BUN 14 09/08/2021  ? NA 140 09/08/2021  ? K 5.2 09/08/2021  ? CL 103 09/08/2021  ? CO2 23 09/08/2021  ? ?Lab Results  ?Component Value Date  ? ALT 12  09/08/2021  ? AST 9 09/08/2021  ? ALKPHOS 106 09/08/2021  ? BILITOT 0.3 09/08/2021  ? ?Lab Results  ?Component Value Date  ? HGBA1C 6.4 01/06/2022  ? HGBA1C 6.2 (H) 09/08/2021  ? HGBA1C 6.2 (H) 06/11/2021  ? HGBA1C 6.1 (H) 02/05/2021  ? HGBA1C 6.5 (H) 09/29/2020  ? ?Lab Results  ?Component Value Date  ? INSULIN 12.4 09/08/2021  ? INSULIN 14.9 06/11/2021  ? INSULIN 13.9 02/05/2021  ? INSULIN 15.2 09/29/2020  ? INSULIN 27.4 (H) 06/17/2020  ? ?Lab Results  ?Component Value Date  ? TSH 2.26 01/06/2022  ? ?Lab Results  ?Component Value Date  ? CHOL 186 09/08/2021  ? HDL 47 09/08/2021  ? LDLCALC 124 (H) 09/08/2021  ? TRIG 82 09/08/2021  ? CHOLHDL 4 06/22/2018  ? ?Lab Results  ?Component Value Date  ? VD25OH 56.0 02/04/2022  ? VD25OH 59.3 09/08/2021  ? VD25OH 54.5 06/11/2021  ? ?Lab Results  ?Component Value Date  ? WBC 9.8 09/29/2020  ? HGB 14.3 09/29/2020  ? HCT 43.4 09/29/2020  ? MCV 84 09/29/2020  ? PLT 436 09/29/2020  ? ?Lab Results  ?Component Value Date  ? IRON 82 01/26/2018  ? TIBC 391 01/26/2018  ? FERRITIN 35 01/26/2018  ? ?Attestation Statements:  ? ?Reviewed by clinician on day of visit: allergies, medications, problem list, medical history, surgical history, family history, social history, and previous encounter notes. ? ? ?I, Trixie Dredge, am acting as transcriptionist for Dennard Nip, MD. ? ?I have reviewed the above documentation for accuracy and completeness, and I agree with the above. -  Dennard Nip, MD ? ? ?

## 2022-02-19 ENCOUNTER — Encounter: Payer: Self-pay | Admitting: Family Medicine

## 2022-02-19 MED ORDER — BISOPROLOL FUMARATE 10 MG PO TABS: 10.0000 mg | ORAL_TABLET | Freq: Every day | ORAL | 1 refills | Status: DC

## 2022-02-19 NOTE — Telephone Encounter (Signed)
Rx removed during last OV. Please advise.  ?

## 2022-02-19 NOTE — Telephone Encounter (Signed)
Refilled bisoprolol 10 mg for her.  I am not certain why this was removed from her med list. ?

## 2022-02-27 ENCOUNTER — Other Ambulatory Visit (INDEPENDENT_AMBULATORY_CARE_PROVIDER_SITE_OTHER): Payer: Self-pay | Admitting: Family Medicine

## 2022-02-27 DIAGNOSIS — E1169 Type 2 diabetes mellitus with other specified complication: Secondary | ICD-10-CM

## 2022-03-08 ENCOUNTER — Ambulatory Visit (INDEPENDENT_AMBULATORY_CARE_PROVIDER_SITE_OTHER): Payer: 59 | Admitting: Family Medicine

## 2022-03-08 ENCOUNTER — Encounter (INDEPENDENT_AMBULATORY_CARE_PROVIDER_SITE_OTHER): Payer: Self-pay | Admitting: Family Medicine

## 2022-03-08 VITALS — BP 104/65 | HR 78 | Temp 98.0°F | Ht 67.0 in | Wt 308.0 lb

## 2022-03-08 DIAGNOSIS — Z6841 Body Mass Index (BMI) 40.0 and over, adult: Secondary | ICD-10-CM | POA: Diagnosis not present

## 2022-03-08 DIAGNOSIS — E669 Obesity, unspecified: Secondary | ICD-10-CM | POA: Diagnosis not present

## 2022-03-08 DIAGNOSIS — Z7984 Long term (current) use of oral hypoglycemic drugs: Secondary | ICD-10-CM

## 2022-03-08 DIAGNOSIS — E559 Vitamin D deficiency, unspecified: Secondary | ICD-10-CM

## 2022-03-08 DIAGNOSIS — E1169 Type 2 diabetes mellitus with other specified complication: Secondary | ICD-10-CM

## 2022-03-08 DIAGNOSIS — E119 Type 2 diabetes mellitus without complications: Secondary | ICD-10-CM | POA: Insufficient documentation

## 2022-03-08 MED ORDER — VITAMIN D (ERGOCALCIFEROL) 1.25 MG (50000 UNIT) PO CAPS: 50000.0000 [IU] | ORAL_CAPSULE | ORAL | 0 refills | Status: DC

## 2022-03-08 MED ORDER — METFORMIN HCL 500 MG PO TABS: 500.0000 mg | ORAL_TABLET | Freq: Every day | ORAL | 0 refills | Status: DC

## 2022-03-23 NOTE — Progress Notes (Unsigned)
Chief Complaint:   OBESITY Kelly Perez is here to discuss her progress with her obesity treatment plan along with follow-up of her obesity related diagnoses. Kelly Perez is on practicing portion control and making smarter food choices, such as increasing vegetables and decreasing simple carbohydrates and states she is following her eating plan approximately 90% of the time. Kelly Perez states she is walking for 30-40 minutes 7 times per week.  Today's visit was #: 30 Starting weight: 372 lbs Starting date: 06/17/2020 Today's weight: 308 lbs Today's date: 03/08/2022 Total lbs lost to date: 64 Total lbs lost since last in-office visit: 0  Interim History: Kelly Perez is retaining some fluid today. She is working on decreasing simple carbohydrates. She is walking most days, and she is trying to meet her protein goals.   Subjective:   1. Type 2 diabetes mellitus with other specified complication, without long-term current use of insulin ([facility]) Kelly Perez continues to do well with metformin as long as she takes it with lunch. She is working on her diet and exercise to control her glucose.   2. Vitamin D deficiency Kelly Perez's recent Vitamin D level was at goal. No side effects were noted.   Assessment/Plan:   1. Type 2 diabetes mellitus with other specified complication, without long-term current use of insulin ([place]) Sinthia will continue metformin, and we will refill for 1 month.  - metFORMIN (GLUCOPHAGE) 500 MG tablet; Take 1 tablet (500 mg total) by mouth daily after lunch.  Dispense: 30 tablet; Refill: 0  2. Vitamin D deficiency Kelly Perez will continue prescription Vitamin D 50,000 IU every week, and we will refill for 1 month. She will follow-up for routine testing of Vitamin D, at least 2-3 times per year to avoid over-replacement.  - Vitamin D, Ergocalciferol, (DRISDOL) 1.25 MG (50000 UNIT) CAPS capsule; Take 1 capsule (50,000 Units total) by mouth every 7 (seven) days.  Dispense: 4 capsule; Refill: 0  3.  Obesity, Current BMI 48.4 Kelly Perez is currently in the action stage of change. As such, her goal is to continue with weight loss efforts. She has agreed to following a lower carbohydrate, vegetable and lean protein rich diet plan.   Exercise goals: As is.  Behavioral modification strategies: increasing lean protein intake, increasing water intake, and no skipping meals.  Kelly Perez has agreed to follow-up with our clinic in 4 weeks. She was informed of the importance of frequent follow-up visits to maximize her success with intensive lifestyle modifications for her multiple health conditions.   Objective:   Blood pressure 104/65, pulse 78, temperature 98 F (36.7 C), height 5' 7"  (1.702 m), weight (!) 308 lb (139.7 kg), last menstrual period 02/13/2018, SpO2 97 %. Body mass index is 48.24 kg/m.  General: Cooperative, alert, well developed, in no acute distress. HEENT: Conjunctivae and lids unremarkable. Cardiovascular: Regular rhythm.  Lungs: Normal work of breathing. Neurologic: No focal deficits.   Lab Results  Component Value Date   CREATININE 0.55 (L) 09/08/2021   BUN 14 09/08/2021   NA 140 09/08/2021   K 5.2 09/08/2021   CL 103 09/08/2021   CO2 23 09/08/2021   Lab Results  Component Value Date   ALT 12 09/08/2021   AST 9 09/08/2021   ALKPHOS 106 09/08/2021   BILITOT 0.3 09/08/2021   Lab Results  Component Value Date   HGBA1C 6.4 01/06/2022   HGBA1C 6.2 (H) 09/08/2021   HGBA1C 6.2 (H) 06/11/2021   HGBA1C 6.1 (H) 02/05/2021   HGBA1C 6.5 (H) 09/29/2020  Lab Results  Component Value Date   INSULIN 12.4 09/08/2021   INSULIN 14.9 06/11/2021   INSULIN 13.9 02/05/2021   INSULIN 15.2 09/29/2020   INSULIN 27.4 (H) 06/17/2020   Lab Results  Component Value Date   TSH 2.26 01/06/2022   Lab Results  Component Value Date   CHOL 186 09/08/2021   HDL 47 09/08/2021   LDLCALC 124 (H) 09/08/2021   TRIG 82 09/08/2021   CHOLHDL 4 06/22/2018   Lab Results  Component Value  Date   VD25OH 56.0 02/04/2022   VD25OH 59.3 09/08/2021   VD25OH 54.5 06/11/2021   Lab Results  Component Value Date   WBC 9.8 09/29/2020   HGB 14.3 09/29/2020   HCT 43.4 09/29/2020   MCV 84 09/29/2020   PLT 436 09/29/2020   Lab Results  Component Value Date   IRON 82 01/26/2018   TIBC 391 01/26/2018   FERRITIN 35 01/26/2018   Attestation Statements:   Reviewed by clinician on day of visit: allergies, medications, problem list, medical history, surgical history, family history, social history, and previous encounter notes.   I, Trixie Dredge, am acting as transcriptionist for Dennard Nip, MD.  I have reviewed the above documentation for accuracy and completeness, and I agree with the above. -  Dennard Nip, MD

## 2022-03-30 ENCOUNTER — Encounter (INDEPENDENT_AMBULATORY_CARE_PROVIDER_SITE_OTHER): Payer: Self-pay | Admitting: Family Medicine

## 2022-03-30 ENCOUNTER — Ambulatory Visit (INDEPENDENT_AMBULATORY_CARE_PROVIDER_SITE_OTHER): Payer: 59 | Admitting: Family Medicine

## 2022-03-30 VITALS — BP 110/67 | HR 57 | Temp 98.0°F | Ht 67.0 in | Wt 304.0 lb

## 2022-03-30 DIAGNOSIS — E669 Obesity, unspecified: Secondary | ICD-10-CM

## 2022-03-30 DIAGNOSIS — Z6841 Body Mass Index (BMI) 40.0 and over, adult: Secondary | ICD-10-CM | POA: Diagnosis not present

## 2022-03-30 DIAGNOSIS — E559 Vitamin D deficiency, unspecified: Secondary | ICD-10-CM

## 2022-03-30 DIAGNOSIS — E8881 Metabolic syndrome: Secondary | ICD-10-CM | POA: Diagnosis not present

## 2022-03-30 DIAGNOSIS — E88819 Insulin resistance, unspecified: Secondary | ICD-10-CM | POA: Insufficient documentation

## 2022-03-30 DIAGNOSIS — Z9189 Other specified personal risk factors, not elsewhere classified: Secondary | ICD-10-CM | POA: Insufficient documentation

## 2022-03-30 DIAGNOSIS — Z7984 Long term (current) use of oral hypoglycemic drugs: Secondary | ICD-10-CM

## 2022-03-30 MED ORDER — METFORMIN HCL 500 MG PO TABS: 500.0000 mg | ORAL_TABLET | Freq: Every day | ORAL | 0 refills | Status: DC

## 2022-03-30 MED ORDER — VITAMIN D (ERGOCALCIFEROL) 1.25 MG (50000 UNIT) PO CAPS: 50000.0000 [IU] | ORAL_CAPSULE | ORAL | 0 refills | Status: DC

## 2022-04-01 NOTE — Progress Notes (Unsigned)
Chief Complaint:   OBESITY Kelly Perez is here to discuss her progress with her obesity treatment plan along with follow-up of her obesity related diagnoses. Kelly Perez is on following a lower carbohydrate, vegetable and lean protein rich diet plan and states she is following her eating plan approximately 85% of the time. Kelly Perez states she is walking the dog for 30 minutes 7 times per week.  Today's visit was #: 82 Starting weight: 372 lbs Starting date: 06/17/2020 Today's weight: 304 lbs Today's date: 03/30/2022 Total lbs lost to date: 4 Total lbs lost since last in-office visit: 4  Interim History: Kelly Perez continues to do well with weight loss. She is on a low carbohydrate diet, but berries are coming in. She sometimes struggles to eat her protein.  Subjective:   1. Vitamin D deficiency Kelly Perez is on Vitamin D, and her last level was at goal. No side effects were noted.   2. Insulin resistance Kelly Perez continues to do well with her diet, exercise, medications, and weight loss. She denies hypoglycemia.   3. At risk for impaired metabolic function Kelly Perez is at increased risk for impaired metabolic function due to current nutrition and muscle mass.  Assessment/Plan:   1. Vitamin D deficiency We will refill prescription Vitamin D for 1 month, and we will recheck labs in 1 month. Kelly Perez will follow-up for routine testing of Vitamin D, at least 2-3 times per year to avoid over-replacement.  - Vitamin D, Ergocalciferol, (DRISDOL) 1.25 MG (50000 UNIT) CAPS capsule; Take 1 capsule (50,000 Units total) by mouth every 7 (seven) days.  Dispense: 4 capsule; Refill: 0  2. Insulin resistance We will refill metformin for 1 month. Kelly Perez will continue to work on weight loss, exercise, and decreasing simple carbohydrates to help decrease the risk of diabetes. Kelly Perez agreed to follow-up with [place] as directed to closely monitor her progress.  - metFORMIN (GLUCOPHAGE) 500 MG tablet; Take 1 tablet (500 mg total) by  mouth daily after lunch.  Dispense: 30 tablet; Refill: 0  3. At risk for impaired metabolic function Kelly Perez was given approximately 15 minutes of impaired  metabolic function prevention counseling today. We discussed intensive lifestyle modifications today with an emphasis on specific nutrition and exercise instructions and strategies.   Repetitive spaced learning was employed today to elicit superior memory formation and behavioral change.  4. Obesity, Current BMI 47.6 Kelly Perez is currently in the action stage of change. As such, her goal is to continue with weight loss efforts. She has agreed to following a lower carbohydrate, vegetable and lean protein rich diet plan.   Exercise goals: As is.  Behavioral modification strategies: increasing lean protein intake.  Kelly Perez has agreed to follow-up with our clinic in 4 weeks. She was informed of the importance of frequent follow-up visits to maximize her success with intensive lifestyle modifications for her multiple health conditions.   Objective:   Blood pressure 110/67, pulse (!) 57, temperature 98 F (36.7 C), height 5' 7"  (1.702 m), weight (!) 304 lb (137.9 kg), last menstrual period 02/13/2018, SpO2 96 %. Body mass index is 47.61 kg/m.  General: Cooperative, alert, well developed, in no acute distress. HEENT: Conjunctivae and lids unremarkable. Cardiovascular: Regular rhythm.  Lungs: Normal work of breathing. Neurologic: No focal deficits.   Lab Results  Component Value Date   CREATININE 0.55 (L) 09/08/2021   BUN 14 09/08/2021   NA 140 09/08/2021   K 5.2 09/08/2021   CL 103 09/08/2021   CO2 23 09/08/2021  Lab Results  Component Value Date   ALT 12 09/08/2021   AST 9 09/08/2021   ALKPHOS 106 09/08/2021   BILITOT 0.3 09/08/2021   Lab Results  Component Value Date   HGBA1C 6.4 01/06/2022   HGBA1C 6.2 (H) 09/08/2021   HGBA1C 6.2 (H) 06/11/2021   HGBA1C 6.1 (H) 02/05/2021   HGBA1C 6.5 (H) 09/29/2020   Lab Results   Component Value Date   INSULIN 12.4 09/08/2021   INSULIN 14.9 06/11/2021   INSULIN 13.9 02/05/2021   INSULIN 15.2 09/29/2020   INSULIN 27.4 (H) 06/17/2020   Lab Results  Component Value Date   TSH 2.26 01/06/2022   Lab Results  Component Value Date   CHOL 186 09/08/2021   HDL 47 09/08/2021   LDLCALC 124 (H) 09/08/2021   TRIG 82 09/08/2021   CHOLHDL 4 06/22/2018   Lab Results  Component Value Date   VD25OH 56.0 02/04/2022   VD25OH 59.3 09/08/2021   VD25OH 54.5 06/11/2021   Lab Results  Component Value Date   WBC 9.8 09/29/2020   HGB 14.3 09/29/2020   HCT 43.4 09/29/2020   MCV 84 09/29/2020   PLT 436 09/29/2020   Lab Results  Component Value Date   IRON 82 01/26/2018   TIBC 391 01/26/2018   FERRITIN 35 01/26/2018   Attestation Statements:   Reviewed by clinician on day of visit: allergies, medications, problem list, medical history, surgical history, family history, social history, and previous encounter notes.    I, Trixie Dredge, am acting as transcriptionist for Dennard Nip, MD.  I have reviewed the above documentation for accuracy and completeness, and I agree with the above. -  Dennard Nip, MD

## 2022-04-02 ENCOUNTER — Other Ambulatory Visit (INDEPENDENT_AMBULATORY_CARE_PROVIDER_SITE_OTHER): Payer: Self-pay | Admitting: Family Medicine

## 2022-04-02 DIAGNOSIS — E8881 Metabolic syndrome: Secondary | ICD-10-CM

## 2022-04-05 ENCOUNTER — Ambulatory Visit (INDEPENDENT_AMBULATORY_CARE_PROVIDER_SITE_OTHER): Payer: 59 | Admitting: Family Medicine

## 2022-04-29 ENCOUNTER — Other Ambulatory Visit (INDEPENDENT_AMBULATORY_CARE_PROVIDER_SITE_OTHER): Payer: Self-pay | Admitting: Family Medicine

## 2022-04-29 DIAGNOSIS — E8881 Metabolic syndrome: Secondary | ICD-10-CM

## 2022-05-04 ENCOUNTER — Ambulatory Visit (INDEPENDENT_AMBULATORY_CARE_PROVIDER_SITE_OTHER): Payer: 59 | Admitting: Family Medicine

## 2022-05-04 ENCOUNTER — Encounter (INDEPENDENT_AMBULATORY_CARE_PROVIDER_SITE_OTHER): Payer: Self-pay | Admitting: Family Medicine

## 2022-05-04 VITALS — BP 128/77 | HR 67 | Temp 98.3°F | Ht 67.0 in | Wt 307.0 lb

## 2022-05-04 DIAGNOSIS — E1169 Type 2 diabetes mellitus with other specified complication: Secondary | ICD-10-CM | POA: Diagnosis not present

## 2022-05-04 DIAGNOSIS — E538 Deficiency of other specified B group vitamins: Secondary | ICD-10-CM

## 2022-05-04 DIAGNOSIS — E559 Vitamin D deficiency, unspecified: Secondary | ICD-10-CM

## 2022-05-04 DIAGNOSIS — Z6841 Body Mass Index (BMI) 40.0 and over, adult: Secondary | ICD-10-CM

## 2022-05-04 DIAGNOSIS — Z7984 Long term (current) use of oral hypoglycemic drugs: Secondary | ICD-10-CM

## 2022-05-04 DIAGNOSIS — R5383 Other fatigue: Secondary | ICD-10-CM | POA: Diagnosis not present

## 2022-05-04 DIAGNOSIS — E669 Obesity, unspecified: Secondary | ICD-10-CM

## 2022-05-04 LAB — HM MAMMOGRAPHY

## 2022-05-04 MED ORDER — METFORMIN HCL 500 MG PO TABS: 500.0000 mg | ORAL_TABLET | Freq: Every day | ORAL | 0 refills | Status: DC

## 2022-05-04 MED ORDER — VITAMIN D (ERGOCALCIFEROL) 1.25 MG (50000 UNIT) PO CAPS: 50000.0000 [IU] | ORAL_CAPSULE | ORAL | 0 refills | Status: DC

## 2022-05-04 NOTE — Progress Notes (Unsigned)
Chief Complaint:   OBESITY Kelly Perez is here to discuss her progress with her obesity treatment plan along with follow-up of her obesity related diagnoses. Kelly Perez is on following a lower carbohydrate, vegetable and lean protein rich diet plan and states she is following her eating plan approximately 90% of the time. Kelly Perez states she is walking/peddling for 20-30 minutes 7 times per week.  Today's visit was #: 28 Starting weight: 372 lbs Starting date: 06/17/2020 Today's weight: 307 lbs Today's date: 05/04/2022 Total lbs lost to date: 65 Total lbs lost since last in-office visit: 0  Interim History: Kelly Perez has been working on decreasing simple carbohydrates in her diet.  She has some GI upset recently however, and she feels bloated.  She will be traveling for work soon which is unusual for her.  Subjective:   1. Other fatigue Kelly Perez notes fatigue for the last 2 weeks.  She is having labs done today.  2. Type 2 diabetes mellitus with other specified complication, without long-term current use of insulin (HCC) Kelly Perez is working on her diet and weight loss, and she is due for labs.  3. Vitamin D deficiency Kelly Perez is on vitamin D, and she is due to have labs checked to monitor her progress.  4. Vitamin B12 deficiency Kelly Perez is on B12, and she is due for labs.  Assessment/Plan:   1. Other fatigue We will check labs today, and we will follow-up at [facility] next visit.  - CBC with Differential/Platelet - TSH  2. Type 2 diabetes mellitus with other specified complication, without long-term current use of insulin (HCC) We will check labs today.  Kelly Perez will continue metformin 500 mg daily with lunch, and we will refill for 1 month.  - CMP14+EGFR - Insulin, random - Hemoglobin A1c - Lipid Panel With LDL/HDL Ratio - metFORMIN (GLUCOPHAGE) 500 MG tablet; Take 1 tablet (500 mg total) by mouth daily after lunch.  Dispense: 30 tablet; Refill: 0  3. Vitamin D deficiency We will check labs  today.  Kelly Perez will continue prescription vitamin D 50,000 units once weekly, and we will refill for 1 month.  - VITAMIN D 25 Hydroxy (Vit-D Deficiency, Fractures) - Vitamin D, Ergocalciferol, (DRISDOL) 1.25 MG (50000 UNIT) CAPS capsule; Take 1 capsule (50,000 Units total) by mouth every 7 (seven) days.  Dispense: 4 capsule; Refill: 0  4. Vitamin B12 deficiency We will check labs today, and we will follow-up at [facility] next visit.  - Vitamin B12  5. Obesity, Current BMI 48.1 Kelly Perez is currently in the action stage of change. As such, her goal is to continue with weight loss efforts. She has agreed to following a lower carbohydrate, vegetable and lean protein rich diet plan.   Exercise goals: As is.   Behavioral modification strategies: increasing lean protein intake, increasing water intake, and travel eating strategies.  Kelly Perez has agreed to follow-up with our clinic in 4 weeks. She was informed of the importance of frequent follow-up visits to maximize her success with intensive lifestyle modifications for her multiple health conditions.   Kelly Perez was informed we would discuss her lab results at her next visit unless there is a critical issue that needs to be addressed sooner. Kelly Perez agreed to keep her next visit at the agreed upon time to discuss these results.  Objective:   Blood pressure 128/77, pulse 67, temperature 98.3 F (36.8 C), height 5' 7"  (1.702 m), weight (!) 307 lb (139.3 kg), last menstrual period 02/13/2018, SpO2 95 %. Body mass index  is 48.08 kg/m.  General: Cooperative, alert, well developed, in no acute distress. HEENT: Conjunctivae and lids unremarkable. Cardiovascular: Regular rhythm.  Lungs: Normal work of breathing. Neurologic: No focal deficits.   Lab Results  Component Value Date   CREATININE 0.55 (L) 09/08/2021   BUN 14 09/08/2021   NA 140 09/08/2021   K 5.2 09/08/2021   CL 103 09/08/2021   CO2 23 09/08/2021   Lab Results  Component Value Date   ALT  12 09/08/2021   AST 9 09/08/2021   ALKPHOS 106 09/08/2021   BILITOT 0.3 09/08/2021   Lab Results  Component Value Date   HGBA1C 6.4 01/06/2022   HGBA1C 6.2 (H) 09/08/2021   HGBA1C 6.2 (H) 06/11/2021   HGBA1C 6.1 (H) 02/05/2021   HGBA1C 6.5 (H) 09/29/2020   Lab Results  Component Value Date   INSULIN 12.4 09/08/2021   INSULIN 14.9 06/11/2021   INSULIN 13.9 02/05/2021   INSULIN 15.2 09/29/2020   INSULIN 27.4 (H) 06/17/2020   Lab Results  Component Value Date   TSH 2.26 01/06/2022   Lab Results  Component Value Date   CHOL 186 09/08/2021   HDL 47 09/08/2021   LDLCALC 124 (H) 09/08/2021   TRIG 82 09/08/2021   CHOLHDL 4 06/22/2018   Lab Results  Component Value Date   VD25OH 56.0 02/04/2022   VD25OH 59.3 09/08/2021   VD25OH 54.5 06/11/2021   Lab Results  Component Value Date   WBC 9.8 09/29/2020   HGB 14.3 09/29/2020   HCT 43.4 09/29/2020   MCV 84 09/29/2020   PLT 436 09/29/2020   Lab Results  Component Value Date   IRON 82 01/26/2018   TIBC 391 01/26/2018   FERRITIN 35 01/26/2018   Attestation Statements:   Reviewed by clinician on day of visit: allergies, medications, problem list, medical history, surgical history, family history, social history, and previous encounter notes.   I, Trixie Dredge, am acting as transcriptionist for Dennard Nip, MD.  I have reviewed the above documentation for accuracy and completeness, and I agree with the above. -  Dennard Nip, MD

## 2022-05-05 LAB — CMP14+EGFR
ALT: 11 IU/L (ref 0–32)
AST: 10 IU/L (ref 0–40)
Albumin/Globulin Ratio: 1.6 (ref 1.2–2.2)
Albumin: 4.4 g/dL (ref 3.8–4.9)
Alkaline Phosphatase: 101 IU/L (ref 44–121)
BUN/Creatinine Ratio: 27 — ABNORMAL HIGH (ref 9–23)
BUN: 16 mg/dL (ref 6–24)
Bilirubin Total: 0.3 mg/dL (ref 0.0–1.2)
CO2: 25 mmol/L (ref 20–29)
Calcium: 9.9 mg/dL (ref 8.7–10.2)
Chloride: 104 mmol/L (ref 96–106)
Creatinine, Ser: 0.59 mg/dL (ref 0.57–1.00)
Globulin, Total: 2.8 g/dL (ref 1.5–4.5)
Glucose: 103 mg/dL — ABNORMAL HIGH (ref 70–99)
Potassium: 5.2 mmol/L (ref 3.5–5.2)
Sodium: 144 mmol/L (ref 134–144)
Total Protein: 7.2 g/dL (ref 6.0–8.5)
eGFR: 105 mL/min/{1.73_m2} (ref >59)

## 2022-05-05 LAB — LIPID PANEL WITH LDL/HDL RATIO
Cholesterol, Total: 198 mg/dL (ref 100–199)
HDL: 47 mg/dL (ref >39)
LDL Chol Calc (NIH): 132 mg/dL — ABNORMAL HIGH (ref 0–99)
LDL/HDL Ratio: 2.8 ratio (ref 0.0–3.2)
Triglycerides: 108 mg/dL (ref 0–149)
VLDL Cholesterol Cal: 19 mg/dL (ref 5–40)

## 2022-05-05 LAB — CBC WITH DIFFERENTIAL/PLATELET
Basophils Absolute: 0.1 10*3/uL (ref 0.0–0.2)
Basos: 1 %
EOS (ABSOLUTE): 0.3 10*3/uL (ref 0.0–0.4)
Eos: 3 %
Hematocrit: 43.9 % (ref 34.0–46.6)
Hemoglobin: 14 g/dL (ref 11.1–15.9)
Immature Grans (Abs): 0 10*3/uL (ref 0.0–0.1)
Immature Granulocytes: 0 %
Lymphocytes Absolute: 2.2 10*3/uL (ref 0.7–3.1)
Lymphs: 28 %
MCH: 27.5 pg (ref 26.6–33.0)
MCHC: 31.9 g/dL (ref 31.5–35.7)
MCV: 86 fL (ref 79–97)
Monocytes Absolute: 0.7 10*3/uL (ref 0.1–0.9)
Monocytes: 9 %
Neutrophils Absolute: 4.5 10*3/uL (ref 1.4–7.0)
Neutrophils: 59 %
Platelets: 437 10*3/uL (ref 150–450)
RBC: 5.1 x10E6/uL (ref 3.77–5.28)
RDW: 13.5 % (ref 11.7–15.4)
WBC: 7.8 10*3/uL (ref 3.4–10.8)

## 2022-05-05 LAB — VITAMIN D 25 HYDROXY (VIT D DEFICIENCY, FRACTURES): Vit D, 25-Hydroxy: 58.8 ng/mL (ref 30.0–100.0)

## 2022-05-05 LAB — HEMOGLOBIN A1C
Est. average glucose Bld gHb Est-mCnc: 123 mg/dL
Hgb A1c MFr Bld: 5.9 % — ABNORMAL HIGH (ref 4.8–5.6)

## 2022-05-05 LAB — VITAMIN B12: Vitamin B-12: >2000 pg/mL — ABNORMAL HIGH (ref 232–1245)

## 2022-05-05 LAB — INSULIN, RANDOM: INSULIN: 10.5 u[IU]/mL (ref 2.6–24.9)

## 2022-05-05 LAB — TSH: TSH: 2.34 u[IU]/mL (ref 0.450–4.500)

## 2022-05-06 ENCOUNTER — Telehealth: Payer: Self-pay

## 2022-05-06 NOTE — Telephone Encounter (Signed)
Received Mammogram results from [facility] on 05/05/22. Will place on PCP desk for review.

## 2022-05-06 NOTE — Telephone Encounter (Signed)
Please inform patient mammogram is normal.

## 2022-05-06 NOTE — Telephone Encounter (Signed)
LVM for pt to CB regarding results.  

## 2022-05-07 NOTE — Telephone Encounter (Signed)
Spoke with pt regarding labs and instructions.   

## 2022-05-28 ENCOUNTER — Encounter: Payer: Self-pay | Admitting: Family Medicine

## 2022-05-28 ENCOUNTER — Other Ambulatory Visit (INDEPENDENT_AMBULATORY_CARE_PROVIDER_SITE_OTHER): Payer: Self-pay | Admitting: Family Medicine

## 2022-05-28 ENCOUNTER — Ambulatory Visit (INDEPENDENT_AMBULATORY_CARE_PROVIDER_SITE_OTHER): Payer: 59 | Admitting: Family Medicine

## 2022-05-28 VITALS — BP 103/69 | HR 63 | Temp 98.1°F | Ht 67.0 in | Wt 305.0 lb

## 2022-05-28 DIAGNOSIS — E1169 Type 2 diabetes mellitus with other specified complication: Secondary | ICD-10-CM

## 2022-05-28 DIAGNOSIS — B9689 Other specified bacterial agents as the cause of diseases classified elsewhere: Secondary | ICD-10-CM

## 2022-05-28 DIAGNOSIS — J329 Chronic sinusitis, unspecified: Secondary | ICD-10-CM

## 2022-05-28 DIAGNOSIS — R0981 Nasal congestion: Secondary | ICD-10-CM

## 2022-05-28 LAB — POC COVID19 BINAXNOW: SARS Coronavirus 2 Ag: NEGATIVE

## 2022-05-28 MED ORDER — PREDNISONE 20 MG PO TABS: 40.0000 mg | ORAL_TABLET | Freq: Every day | ORAL | 0 refills | Status: DC

## 2022-05-28 MED ORDER — AMOXICILLIN-POT CLAVULANATE 875-125 MG PO TABS: 1.0000 | ORAL_TABLET | Freq: Two times a day (BID) | ORAL | 0 refills | Status: DC

## 2022-05-28 NOTE — Patient Instructions (Signed)

## 2022-05-28 NOTE — Progress Notes (Signed)
Kelly Perez , [birth year], 56 y.o., female MRN: [id number] Patient Care Team    Relationship Specialty Notifications Start End  Ma Hillock, DO PCP - General Family Medicine  07/06/21   Thornton Park, MD Consulting Physician Gastroenterology  07/08/21   Marin Comment, [facility] [place], [place] Referring Physician Optometry  07/08/21   Starlyn Skeans, MD Consulting Physician Bariatrics  07/08/21     Chief Complaint  Patient presents with   Nasal Congestion    Pt c/o nasal congestion and ear pain/fullness, nasal drainage with greenish x 7 days; pt states that she recently was on airplane that worsen sx; home covid neg 05/27/22     Subjective: Pt presents for an OV with complaints of sinus pressure, nasal drainage and congestion of 7d duration.  Patient has tried Mucinex, Flonase and nasal irrigation.  Her symptoms continue to worsen.  She reports she did go to [place] last week for business and symptoms started on her way home.  She did take a COVID test at home which was negative yesterday.     01/06/2022    8:49 AM 07/06/2021    9:56 AM 06/17/2020    8:13 AM 01/26/2018   10:43 AM  Depression screen PHQ 2/9  Decreased Interest 1 0 3 1  Down, Depressed, Hopeless 1 0 2 1  PHQ - 2 Score 2 0 5 2  Altered sleeping   3 1  Tired, decreased energy   3 1  Change in appetite   2 1  Feeling bad or failure about yourself    1 1  Trouble concentrating   1 1  Moving slowly or fidgety/restless   1 0  Suicidal thoughts   0 0  PHQ-9 Score   16 7  Difficult doing work/chores   Somewhat difficult Somewhat difficult    Allergies  Allergen Reactions   Neosporin [Neomycin-Bacitracin Zn-Polymyx] Rash   Tetracycline Anaphylaxis   Neomycin    Other     Glutin     Social History   Social History Narrative   Originally from [place]; lived in [place] x 2 years prior to moving to [place] in 2018 to be near only daughter and grandkids.   Lives alone with great Dane. Divorced.    Education/employment:  Masters degree.  Employed as a Landscape architect.   Safety:      -smoke alarm in the home:Yes     - wears seatbelt: Yes     - Feels safe in their relationships: Yes      Past Medical History:  Diagnosis Date   Allergy    Anemia    Anxiety    Arthritis    Bilateral primary osteoarthritis of knee 01/26/2018   Celiac disease 01/26/2018   Chronic depression 01/26/2018   Constipation    Food allergy    Gallbladder problem    GERD (gastroesophageal reflux disease)    Hypertension    Morbid obesity ([place]) 01/26/2018   Osteoarthritis    PTSD (post-traumatic stress disorder)    Sleep apnea    cpap   Vitamin B12 deficiency    due to celiac   Vitamin D deficiency    Past Surgical History:  Procedure Laterality Date   CHOLECYSTECTOMY  2015   SINUS IRRIGATION     Family History  Problem Relation Age of Onset   Bipolar disorder Mother    Diabetes Mother    Hypertension Mother    Heart disease Mother    Depression  Mother    Obesity Mother    COPD Father    Depression Father    Early death Father    Heart disease Father    Kidney disease Father    Cardiomyopathy Father    Arthritis Father    Hypertension Father    Bipolar disorder Sister    Diabetes Brother    Heart disease Brother    Diabetes Maternal Grandmother    Bipolar disorder Maternal Grandfather    Diabetes Paternal Grandmother    Diabetes Paternal Grandfather    Hypertension Daughter    Depression Daughter    Diabetes Daughter    Rectal cancer Maternal Aunt    Stomach cancer Neg Hx    Liver disease Neg Hx    Colon cancer Neg Hx    Pancreatic cancer Neg Hx    Esophageal cancer Neg Hx    Allergies as of 05/28/2022       Reactions   Neosporin [neomycin-bacitracin Zn-polymyx] Rash   Tetracycline Anaphylaxis   Neomycin    Other    Glutin        Medication List        Accurate as of May 28, 2022  1:54 PM. If you have any questions, ask your nurse or doctor.          STOP taking these medications     mupirocin cream 2 % Commonly known as: Bactroban Stopped by: Howard Pouch, DO   triamcinolone cream 0.1 % Commonly known as: KENALOG Stopped by: Howard Pouch, DO       TAKE these medications    amoxicillin-clavulanate 875-125 MG tablet Commonly known as: AUGMENTIN Take 1 tablet by mouth 2 (two) times daily. Started by: Howard Pouch, DO   bisoprolol 10 MG tablet Commonly known as: ZEBETA Take 1 tablet (10 mg total) by mouth daily.   cetirizine 10 MG tablet Commonly known as: ZYRTEC Take 10 mg by mouth daily.   cyanocobalamin 1000 MCG tablet Commonly known as: VITAMIN B12 Take 3,000 mcg by mouth daily.   ezetimibe 10 MG tablet Commonly known as: Zetia Take 1 tablet (10 mg total) by mouth daily.   Iron 325 (65 Fe) MG Tabs Take 1 tablet by mouth daily.   Magnesium 400 MG Tabs Take 1 tablet by mouth daily.   metFORMIN 500 MG tablet Commonly known as: GLUCOPHAGE Take 1 tablet (500 mg total) by mouth daily after lunch.   predniSONE 20 MG tablet Commonly known as: DELTASONE Take 2 tablets (40 mg total) by mouth daily with breakfast. Started by: Howard Pouch, DO   SUPER ENZYMES PO Take 1 tablet by mouth daily.   TURMERIC PO Take 1 tablet by mouth daily.   Vitamin D (Ergocalciferol) 1.25 MG (50000 UNIT) Caps capsule Commonly known as: DRISDOL Take 1 capsule (50,000 Units total) by mouth every 7 (seven) days.   Zinc 50 MG Tabs Take 1 tablet by mouth daily.        All past medical history, surgical history, allergies, family history, immunizations andmedications were updated in the EMR today and reviewed under the history and medication portions of their EMR.     Review of Systems  Constitutional:  Positive for malaise/fatigue. Negative for chills and fever.  HENT:  Positive for congestion, ear pain and sinus pain.   Eyes:  Negative for pain, discharge and redness.  Respiratory:  Positive for cough and sputum production. Negative for shortness of  breath and wheezing.   Gastrointestinal:  Negative for constipation, diarrhea,  nausea and vomiting.  Skin:  Negative for rash.  Neurological:  Positive for headaches. Negative for dizziness.   Negative, with the exception of above mentioned in HPI   Objective:  BP 103/69   Pulse 63   Temp 98.1 F (36.7 C) (Oral)   Ht 5' 7"  (1.702 m)   Wt (!) 305 lb (138.3 kg)   LMP 02/13/2018   SpO2 95%   BMI 47.77 kg/m  Body mass index is 47.77 kg/m. Physical Exam Vitals and nursing note reviewed.  Constitutional:      General: She is not in acute distress.    Appearance: Normal appearance. She is normal weight. She is not ill-appearing or toxic-appearing.  HENT:     Head: Normocephalic and atraumatic.     Right Ear: Ear canal normal. A middle ear effusion is present. There is no impacted cerumen. Tympanic membrane is not retracted or bulging.     Left Ear: Tympanic membrane and ear canal normal. There is no impacted cerumen.     Nose: Congestion and rhinorrhea present.     Mouth/Throat:     Mouth: Mucous membranes are moist.     Pharynx: Posterior oropharyngeal erythema present. No oropharyngeal exudate.  Eyes:     General:        Right eye: No discharge.        Left eye: No discharge.     Extraocular Movements: Extraocular movements intact.     Conjunctiva/sclera: Conjunctivae normal.     Pupils: Pupils are equal, round, and reactive to light.  Cardiovascular:     Rate and Rhythm: Normal rate and regular rhythm.  Pulmonary:     Effort: Pulmonary effort is normal. No respiratory distress.     Breath sounds: Normal breath sounds. No wheezing, rhonchi or rales.  Musculoskeletal:     Cervical back: Neck supple.  Lymphadenopathy:     Cervical: No cervical adenopathy.  Skin:    Findings: No rash.  Neurological:     Mental Status: She is alert and oriented to person, place, and time. Mental status is at baseline.  Psychiatric:        Mood and Affect: Mood normal.        Behavior:  Behavior normal.        Thought Content: Thought content normal.        Judgment: Judgment normal.     No results found. No results found. Results for orders placed or performed in visit on 05/28/22 (from the past 24 hour(s))  POC COVID-19 BinaxNow     Status: Normal   Collection Time: 05/28/22  1:54 PM  Result Value Ref Range   SARS Coronavirus 2 Ag Negative Negative    Assessment/Plan: Pierre Cumpton is a 56 y.o. female present for OV for  Nasal congestion/ Bacterial sinusitis - POC COVID-19 BinaxNow> negative Rest, hydrate.  Continue + flonase, mucinex (DM if cough), nettie pot or nasal saline.  Augmentin and predisone burst prescribed, take until completed.  F/U 2 weeks if not improved.   Reviewed expectations re: course of current medical issues. Discussed self-management of symptoms. Outlined signs and symptoms indicating need for more acute intervention. Patient verbalized understanding and all questions were answered. Patient received an After-Visit Summary.    Orders Placed This Encounter  Procedures   POC COVID-19 BinaxNow   Meds ordered this encounter  Medications   amoxicillin-clavulanate (AUGMENTIN) 875-125 MG tablet    Sig: Take 1 tablet by mouth 2 (two) times daily.  Dispense:  20 tablet    Refill:  0   predniSONE (DELTASONE) 20 MG tablet    Sig: Take 2 tablets (40 mg total) by mouth daily with breakfast.    Dispense:  10 tablet    Refill:  0   Referral Orders  No referral(s) requested today     Note is dictated utilizing voice recognition software. Although note has been proof read prior to signing, occasional typographical errors still can be missed. If any questions arise, please do not hesitate to call for verification.   electronically signed by:  Howard Pouch, DO  [place]

## 2022-06-01 ENCOUNTER — Ambulatory Visit (INDEPENDENT_AMBULATORY_CARE_PROVIDER_SITE_OTHER): Payer: 59 | Admitting: Family Medicine

## 2022-06-01 ENCOUNTER — Encounter (INDEPENDENT_AMBULATORY_CARE_PROVIDER_SITE_OTHER): Payer: Self-pay | Admitting: Family Medicine

## 2022-06-01 VITALS — BP 114/70 | HR 70 | Temp 98.1°F | Ht 67.0 in | Wt 304.0 lb

## 2022-06-01 DIAGNOSIS — E7849 Other hyperlipidemia: Secondary | ICD-10-CM

## 2022-06-01 DIAGNOSIS — E669 Obesity, unspecified: Secondary | ICD-10-CM

## 2022-06-01 DIAGNOSIS — Z6841 Body Mass Index (BMI) 40.0 and over, adult: Secondary | ICD-10-CM

## 2022-06-01 DIAGNOSIS — K900 Celiac disease: Secondary | ICD-10-CM

## 2022-06-01 DIAGNOSIS — E559 Vitamin D deficiency, unspecified: Secondary | ICD-10-CM | POA: Diagnosis not present

## 2022-06-01 MED ORDER — VITAMIN D (ERGOCALCIFEROL) 1.25 MG (50000 UNIT) PO CAPS: 50000.0000 [IU] | ORAL_CAPSULE | ORAL | 0 refills | Status: DC

## 2022-06-02 ENCOUNTER — Encounter (INDEPENDENT_AMBULATORY_CARE_PROVIDER_SITE_OTHER): Payer: Self-pay

## 2022-06-08 ENCOUNTER — Other Ambulatory Visit (HOSPITAL_COMMUNITY)
Admission: RE | Admit: 2022-06-08 | Discharge: 2022-06-08 | Disposition: A | Discharge status: Home / Self Care | Payer: 59 | Source: Ambulatory Visit | Attending: Family Medicine | Admitting: Family Medicine

## 2022-06-08 ENCOUNTER — Ambulatory Visit (INDEPENDENT_AMBULATORY_CARE_PROVIDER_SITE_OTHER): Payer: 59 | Admitting: Family Medicine

## 2022-06-08 ENCOUNTER — Encounter: Payer: Self-pay | Admitting: Family Medicine

## 2022-06-08 VITALS — BP 105/68 | HR 68 | Temp 98.5°F | Ht 67.0 in | Wt 309.0 lb

## 2022-06-08 DIAGNOSIS — Z01419 Encounter for gynecological examination (general) (routine) without abnormal findings: Secondary | ICD-10-CM

## 2022-06-08 DIAGNOSIS — I10 Essential (primary) hypertension: Secondary | ICD-10-CM

## 2022-06-08 DIAGNOSIS — Z8249 Family history of ischemic heart disease and other diseases of the circulatory system: Secondary | ICD-10-CM | POA: Diagnosis not present

## 2022-06-08 DIAGNOSIS — E785 Hyperlipidemia, unspecified: Secondary | ICD-10-CM

## 2022-06-08 DIAGNOSIS — E1169 Type 2 diabetes mellitus with other specified complication: Secondary | ICD-10-CM | POA: Diagnosis not present

## 2022-06-08 DIAGNOSIS — Z6841 Body Mass Index (BMI) 40.0 and over, adult: Secondary | ICD-10-CM

## 2022-06-08 MED ORDER — BISOPROLOL FUMARATE 10 MG PO TABS: 10.0000 mg | ORAL_TABLET | Freq: Every day | ORAL | 1 refills | Status: DC

## 2022-06-08 MED ORDER — EZETIMIBE 10 MG PO TABS: 10.0000 mg | ORAL_TABLET | Freq: Every day | ORAL | 3 refills | Status: DC

## 2022-06-08 NOTE — Progress Notes (Signed)
Patient ID: Kelly Perez, female  DOB: Mar 19, 1966, 56 y.o.   MRN: [id number] Patient Care Team    Relationship Specialty Notifications Start End  Ma Hillock, DO PCP - General Family Medicine  07/06/21   Thornton Park, MD Consulting Physician Gastroenterology  07/08/21   Marin Comment, My [place], [place] Referring Physician Optometry  07/08/21   Starlyn Skeans, MD Consulting Physician Bariatrics  07/08/21     Chief Complaint  Patient presents with   Hypertension    Cmc; pt is not fasting    Subjective: Kelly Perez is a 56 y.o.  Female  present for cmc All past medical history, surgical history, allergies, family history, immunizations, medications and social history were updated in the electronic medical record today. All recent labs, ED visits and hospitalizations within the last year were reviewed.  Hyperlipidemia associated with type 2 diabetes mellitus ([place]) Diabetes has been managed by her weight management team. She currently is prescribed metformin 500 mg daily.  Reports she has not been able to tolerate higher doses.  Essential hypertension/lipidemia/morbid obesity/family history of heart disease Pt reports compliance with bisoprolol 10 mg daily.. Patient denies chest pain, shortness of breath, dizziness or lower extremity edema.  Pt is not taking a daily baby ASA. Pt is not prescribed statin (declined - does not like idea of statin).  She has been agreeable to taking Zetia She is a patient of weight management. RF: Hypertension, hyperlipidemia, obesity, diabetes, family history of heart disease  Well women exam: She denies pelvic pain, discharge, lesions or dysparunia.  Patient's last menstrual period was 02/13/2018.     01/06/2022    8:49 AM 07/06/2021    9:56 AM 06/17/2020    8:13 AM 01/26/2018   10:43 AM  Depression screen PHQ 2/9  Decreased Interest 1 0 3 1  Down, Depressed, Hopeless 1 0 2 1  PHQ - 2 Score 2 0 5 2  Altered sleeping   3 1  Tired, decreased energy   3 1   Change in appetite   2 1  Feeling bad or failure about yourself    1 1  Trouble concentrating   1 1  Moving slowly or fidgety/restless   1 0  Suicidal thoughts   0 0  PHQ-9 Score   16 7  Difficult doing work/chores   Somewhat difficult Somewhat difficult      01/26/2018   10:43 AM  GAD 7 : Generalized Anxiety Score  Nervous, Anxious, on Edge 1  Control/stop worrying 1  Worry too much - different things 1  Trouble relaxing 1  Restless 1  Easily annoyed or irritable 2  Afraid - awful might happen 1  Total GAD 7 Score 8  Anxiety Difficulty Somewhat difficult    Immunization History  Administered Date(s) Administered   Influenza,inj,Quad PF,6+ Mos 06/22/2018   PFIZER(Purple Top)SARS-COV-2 Vaccination 02/11/2020, 03/03/2020   PNEUMOCOCCAL CONJUGATE-20 01/06/2022    Past Medical History:  Diagnosis Date   Allergy    Anemia    Anxiety    Arthritis    Bilateral primary osteoarthritis of knee 01/26/2018   Celiac disease 01/26/2018   Chronic depression 01/26/2018   Constipation    Food allergy    Gallbladder problem    GERD (gastroesophageal reflux disease)    Hypertension    Morbid obesity ([place]) 01/26/2018   Osteoarthritis    PTSD (post-traumatic stress disorder)    Sleep apnea    cpap   Vitamin B12 deficiency  due to celiac   Vitamin D deficiency    Allergies  Allergen Reactions   Neosporin [Neomycin-Bacitracin Zn-Polymyx] Rash   Tetracycline Anaphylaxis   Neomycin    Other     Glutin     Past Surgical History:  Procedure Laterality Date   CHOLECYSTECTOMY  2015   SINUS IRRIGATION     Family History  Problem Relation Age of Onset   Bipolar disorder Mother    Diabetes Mother    Hypertension Mother    Heart disease Mother    Depression Mother    Obesity Mother    COPD Father    Depression Father    Early death Father    Heart disease Father    Kidney disease Father    Cardiomyopathy Father    Arthritis Father    Hypertension Father    Bipolar  disorder Sister    Diabetes Brother    Heart disease Brother    Diabetes Maternal Grandmother    Bipolar disorder Maternal Grandfather    Diabetes Paternal Grandmother    Diabetes Paternal Grandfather    Hypertension Daughter    Depression Daughter    Diabetes Daughter    Rectal cancer Maternal Aunt    Stomach cancer Neg Hx    Liver disease Neg Hx    Colon cancer Neg Hx    Pancreatic cancer Neg Hx    Esophageal cancer Neg Hx    Social History   Social History Narrative   Originally from [place]; lived in [place] x 2 years prior to moving to [place] in 2018 to be near only daughter and grandkids.   Lives alone with great Dane. Divorced.    Education/employment: Masters degree.  Employed as a Landscape architect.   Safety:      -smoke alarm in the home:Yes     - wears seatbelt: Yes     - Feels safe in their relationships: Yes       Allergies as of 06/08/2022       Reactions   Neosporin [neomycin-bacitracin Zn-polymyx] Rash   Tetracycline Anaphylaxis   Neomycin    Other    Glutin        Medication List        Accurate as of June 08, 2022  8:42 AM. If you have any questions, ask your nurse or doctor.          STOP taking these medications    amoxicillin-clavulanate 875-125 MG tablet Commonly known as: AUGMENTIN Stopped by: Howard Pouch, DO   predniSONE 20 MG tablet Commonly known as: DELTASONE Stopped by: Howard Pouch, DO       TAKE these medications    bisoprolol 10 MG tablet Commonly known as: ZEBETA Take 1 tablet (10 mg total) by mouth daily.   cetirizine 10 MG tablet Commonly known as: ZYRTEC Take 10 mg by mouth daily.   cyanocobalamin 1000 MCG tablet Commonly known as: VITAMIN B12 Take 3,000 mcg by mouth daily.   ezetimibe 10 MG tablet Commonly known as: Zetia Take 1 tablet (10 mg total) by mouth daily.   Iron 325 (65 Fe) MG Tabs Take 1 tablet by mouth daily.   Magnesium 400 MG Tabs Take 1 tablet by mouth daily.   metFORMIN 500  MG tablet Commonly known as: GLUCOPHAGE Take 1 tablet (500 mg total) by mouth daily after lunch.   SUPER ENZYMES PO Take 1 tablet by mouth daily.   TURMERIC PO Take 1 tablet by mouth daily.  Vitamin D (Ergocalciferol) 1.25 MG (50000 UNIT) Caps capsule Commonly known as: DRISDOL Take 1 capsule (50,000 Units total) by mouth every 7 (seven) days.   Zinc 50 MG Tabs Take 1 tablet by mouth daily.        All past medical history, surgical history, allergies, family history, immunizations andmedications were updated in the EMR today and reviewed under the history and medication portions of their EMR.     US Abdomen Complete Result Date: 03/24/2021 IMPRESSION: 1.  Cholecystectomy.  No biliary distention. 2. Heterogeneous echogenic hepatic parenchymal pattern consistent with fatty infiltration or hepatocellular disease. No focal hepatic abnormality identified.    ROS 14 pt review of systems performed and negative (unless mentioned in an HPI)  Objective: BP 105/68   Pulse 68   Temp 98.5 F (36.9 C) (Oral)   Ht 5' 7"  (1.702 m)   Wt (!) 309 lb (140.2 kg)   LMP 02/13/2018   SpO2 95%   BMI 48.40 kg/m  Physical Exam Vitals and nursing note reviewed.  Constitutional:      General: She is not in acute distress.    Appearance: Normal appearance. She is obese. She is not ill-appearing or toxic-appearing.  HENT:     Mouth/Throat:     Mouth: Mucous membranes are moist.  Eyes:     Extraocular Movements: Extraocular movements intact.     Conjunctiva/sclera: Conjunctivae normal.     Pupils: Pupils are equal, round, and reactive to light.  Cardiovascular:     Rate and Rhythm: Normal rate and regular rhythm.     Pulses: Normal pulses.     Heart sounds: Normal heart sounds. No murmur heard.    No friction rub. No gallop.  Pulmonary:     Effort: Pulmonary effort is normal. No respiratory distress.     Breath sounds: Normal breath sounds. No stridor. No wheezing, rhonchi or rales.   Chest:     Chest wall: No tenderness.  Musculoskeletal:        General: No tenderness.     Cervical back: Normal range of motion and neck supple. No rigidity or tenderness.     Right lower leg: No edema.     Left lower leg: No edema.  Lymphadenopathy:     Cervical: No cervical adenopathy.  Skin:    Findings: No erythema or rash.  Neurological:     General: No focal deficit present.     Mental Status: She is alert and oriented to person, place, and time. Mental status is at baseline.     Motor: No weakness.     Coordination: Coordination normal.     Gait: Gait normal.  Psychiatric:        Mood and Affect: Mood normal.        Behavior: Behavior normal.        Thought Content: Thought content normal.        Judgment: Judgment normal.   Breasts: breasts appear normal, symmetrical, no tenderness on exam, no suspicious masses, no skin or nipple changes or axillary nodes. GYN:  External genitalia within normal limits, normal hair distribution, no lesions. Urethral meatus normal, no lesions. Vaginal mucosa pink, moist, normal rugae, no lesions. No cystocele or rectocele. cervix without lesions, no discharge. Bimanual exam revealed normal uterus.  No bladder/suprapubic fullness, masses or tenderness. No cervical motion tenderness. No adnexal fullness. Anus and perineum within normal limits, no lesions.  Diabetic Foot Exam - Simple   Simple Foot Form Diabetic Foot exam was performed  with the following findings: Yes 06/08/2022  8:37 AM  Visual Inspection No deformities, no ulcerations, no other skin breakdown bilaterally: Yes Sensation Testing Intact to touch and monofilament testing bilaterally: Yes Pulse Check Posterior Tibialis and Dorsalis pulse intact bilaterally: Yes Comments Thickened skin around bilateral heels.       No results found.  Assessment/plan: Chyan Carnero is a 56 y.o. female present for cmc Hyperlipidemia associated with type 2 diabetes mellitus ([place]) Diabetes  has been managed by her weight management team. She currently is prescribed metformin 500 mg daily> prescribed by Dr. Leafy Ro Reviewed recent labs Urine microalbumin up-to-date 08/2021 Pneumonia vaccination up-to-date Influenza vaccination encouraged yearly Eye exam completed 06/2021- requested records Foot exam completed 06/08/2022   Essential hypertension/lipidemia/morbid obesity/family history of heart disease Stable Continue bisoprolol 10 mg daily. Discussed different options for cholesterol management.  Continue Zetia 10 mg daily prescribed. RF: Hypertension, hyperlipidemia, obesity, diabetes, family history of heart disease   Encounter for gynecological examination with Papanicolaou smear of cervix - Cytology - PAP( [place]) with HPV cotest If normal, next pap 5 yrs  Return in about 7 months (around 01/10/2023) for cpe (20 min), Routine chronic condition follow-up.  No orders of the defined types were placed in this encounter.   No orders of the defined types were placed in this encounter.  Meds ordered this encounter  Medications   bisoprolol (ZEBETA) 10 MG tablet    Sig: Take 1 tablet (10 mg total) by mouth daily.    Dispense:  90 tablet    Refill:  1   ezetimibe (ZETIA) 10 MG tablet    Sig: Take 1 tablet (10 mg total) by mouth daily.    Dispense:  90 tablet    Refill:  3   Referral Orders  No referral(s) requested today     Electronically signed by: Howard Pouch, [place]

## 2022-06-08 NOTE — Progress Notes (Signed)
Chief Complaint:   OBESITY Kelly Perez is here to discuss her progress with her obesity treatment plan along with follow-up of her obesity related diagnoses. Kelly Perez is on following a lower carbohydrate, vegetable and lean protein rich diet plan and states she is following her eating plan approximately 85% of the time. Kelly Perez states she is walking for 15-20 minutes 7 times per week.  Today's visit was #: 43 Starting weight: 372 lbs Starting date: 06/17/2020 Today's weight: 304 lbs Today's date: 06/01/2022 Total lbs lost to date: 53 Total lbs lost since last in-office visit: 3  Interim History: Kelly Perez has been traveling but she did well with meal planning and she has some low carbohydrate snacks.  She has been on prednisone for a sinus infection but she is doing well with decreasing snacks.  Subjective:   1. Celiac disease Kelly Perez is doing for psyllium capsules with every meal and she is doing well avoiding gluten.  She denies recent flareups.  2. Vitamin D deficiency Kelly Perez's vitamin D level is at goal, and she denies nausea, vomiting, or muscle weakness.  I discussed labs with the patient today.  3. Other hyperlipidemia Kelly Perez's LDL is slightly elevated.  She is on Zetia, and she is working on decreasing saturated fats.  Discussed labs with the patient today.  Assessment/Plan:   1. Celiac disease Kelly Perez will continue with her diet, and we will continue to follow.  2. Vitamin D deficiency We will refill prescription Vitamin D for 1 month. Kelly Perez will follow-up for routine testing of Vitamin D, at least 2-3 times per year to avoid over-replacement.  - Vitamin D, Ergocalciferol, (DRISDOL) 1.25 MG (50000 UNIT) CAPS capsule; Take 1 capsule (50,000 Units total) by mouth every 7 (seven) days.  Dispense: 4 capsule; Refill: 0  3. Other hyperlipidemia Kelly Perez will continue with Zetia and her diet. We will plan to recheck labs in 3-4 months.   4. Obesity, Current BMI 47.7 Kelly Perez is currently in the  action stage of change. As such, her goal is to continue with weight loss efforts. She has agreed to following a lower carbohydrate, vegetable and lean protein rich diet plan.   Exercise goals: As is.   Behavioral modification strategies: increasing lean protein intake.  Kelly Perez has agreed to follow-up with our clinic in 4 weeks. She was informed of the importance of frequent follow-up visits to maximize her success with intensive lifestyle modifications for her multiple health conditions.   Objective:   Blood pressure 114/70, pulse 70, temperature 98.1 F (36.7 C), height 5' 7"  (1.702 m), weight (!) 304 lb (137.9 kg), last menstrual period 02/13/2018, SpO2 96 %. Body mass index is 47.61 kg/m.  General: Cooperative, alert, well developed, in no acute distress. HEENT: Conjunctivae and lids unremarkable. Cardiovascular: Regular rhythm.  Lungs: Normal work of breathing. Neurologic: No focal deficits.   Lab Results  Component Value Date   CREATININE 0.59 05/04/2022   BUN 16 05/04/2022   NA 144 05/04/2022   K 5.2 05/04/2022   CL 104 05/04/2022   CO2 25 05/04/2022   Lab Results  Component Value Date   ALT 11 05/04/2022   AST 10 05/04/2022   ALKPHOS 101 05/04/2022   BILITOT 0.3 05/04/2022   Lab Results  Component Value Date   HGBA1C 5.9 (H) 05/04/2022   HGBA1C 6.4 01/06/2022   HGBA1C 6.2 (H) 09/08/2021   HGBA1C 6.2 (H) 06/11/2021   HGBA1C 6.1 (H) 02/05/2021   Lab Results  Component Value Date  INSULIN 10.5 05/04/2022   INSULIN 12.4 09/08/2021   INSULIN 14.9 06/11/2021   INSULIN 13.9 02/05/2021   INSULIN 15.2 09/29/2020   Lab Results  Component Value Date   TSH 2.340 05/04/2022   Lab Results  Component Value Date   CHOL 198 05/04/2022   HDL 47 05/04/2022   LDLCALC 132 (H) 05/04/2022   TRIG 108 05/04/2022   CHOLHDL 4 06/22/2018   Lab Results  Component Value Date   VD25OH 58.8 05/04/2022   VD25OH 56.0 02/04/2022   VD25OH 59.3 09/08/2021   Lab Results   Component Value Date   WBC 7.8 05/04/2022   HGB 14.0 05/04/2022   HCT 43.9 05/04/2022   MCV 86 05/04/2022   PLT 437 05/04/2022   Lab Results  Component Value Date   IRON 82 01/26/2018   TIBC 391 01/26/2018   FERRITIN 35 01/26/2018   Attestation Statements:   Reviewed by clinician on day of visit: allergies, medications, problem list, medical history, surgical history, family history, social history, and previous encounter notes.   I, Trixie Dredge, am acting as transcriptionist for Dennard Nip, MD.  I have reviewed the above documentation for accuracy and completeness, and I agree with the above. -  Dennard Nip, MD

## 2022-06-08 NOTE — Patient Instructions (Signed)
No follow-ups on file.        Great to see you today.  I have refilled the medication(s) we provide.   If labs were collected, we will inform you of lab results once received either by echart message or telephone call.   - echart message- for normal results that have been seen by the patient already.   - telephone call: abnormal results or if patient has not viewed results in their echart.  

## 2022-06-09 LAB — CYTOLOGY - PAP
Comment: NEGATIVE
Diagnosis: NEGATIVE
High risk HPV: NEGATIVE

## 2022-06-11 ENCOUNTER — Ambulatory Visit (INDEPENDENT_AMBULATORY_CARE_PROVIDER_SITE_OTHER): Payer: 59 | Admitting: Family Medicine

## 2022-06-11 ENCOUNTER — Encounter: Payer: Self-pay | Admitting: Family Medicine

## 2022-06-11 VITALS — BP 122/80 | HR 65 | Temp 98.2°F | Ht 67.0 in | Wt 306.0 lb

## 2022-06-11 DIAGNOSIS — I781 Nevus, non-neoplastic: Secondary | ICD-10-CM

## 2022-06-11 DIAGNOSIS — W57XXXA Bitten or stung by nonvenomous insect and other nonvenomous arthropods, initial encounter: Secondary | ICD-10-CM

## 2022-06-11 DIAGNOSIS — S20469A Insect bite (nonvenomous) of unspecified back wall of thorax, initial encounter: Secondary | ICD-10-CM | POA: Diagnosis not present

## 2022-06-11 HISTORY — DX: Nevus, non-neoplastic: I78.1

## 2022-06-11 NOTE — Progress Notes (Signed)
Kelly Perez , 1966/02/28, 56 y.o., female MRN: [id number] Patient Care Team    Relationship Specialty Notifications Start End  Ma Hillock, DO PCP - General Family Medicine  07/06/21   Thornton Park, MD Consulting Physician Gastroenterology  07/08/21   Marin Comment, My [place], [place] Referring Physician Optometry  07/08/21   Starlyn Skeans, MD Consulting Physician Bariatrics  07/08/21     Chief Complaint  Patient presents with   Rash    Pt c/o rash on upper back that itches x 2 days;      Subjective: Pt presents for an OV with complaints of itchy rash over her back.  She reports she noticed it about 2 days ago.     01/06/2022    8:49 AM 07/06/2021    9:56 AM 06/17/2020    8:13 AM 01/26/2018   10:43 AM  Depression screen PHQ 2/9  Decreased Interest 1 0 3 1  Down, Depressed, Hopeless 1 0 2 1  PHQ - 2 Score 2 0 5 2  Altered sleeping   3 1  Tired, decreased energy   3 1  Change in appetite   2 1  Feeling bad or failure about yourself    1 1  Trouble concentrating   1 1  Moving slowly or fidgety/restless   1 0  Suicidal thoughts   0 0  PHQ-9 Score   16 7  Difficult doing work/chores   Somewhat difficult Somewhat difficult    Allergies  Allergen Reactions   Neosporin [Neomycin-Bacitracin Zn-Polymyx] Rash   Tetracycline Anaphylaxis   Neomycin    Other     Glutin     Social History   Social History Narrative   Originally from [place]; lived in [place] x 2 years prior to moving to [place] in 2018 to be near only daughter and grandkids.   Lives alone with great Dane. Divorced.    Education/employment: Masters degree.  Employed as a Landscape architect.   Safety:      -smoke alarm in the home:Yes     - wears seatbelt: Yes     - Feels safe in their relationships: Yes      Past Medical History:  Diagnosis Date   Allergy    Anemia    Anxiety    Arthritis    Bilateral primary osteoarthritis of knee 01/26/2018   Celiac disease 01/26/2018   Chronic depression 01/26/2018    Constipation    Food allergy    Gallbladder problem    GERD (gastroesophageal reflux disease)    Hypertension    Morbid obesity ([place]) 01/26/2018   Osteoarthritis    PTSD (post-traumatic stress disorder)    Sleep apnea    cpap   Vitamin B12 deficiency    due to celiac   Vitamin D deficiency    Past Surgical History:  Procedure Laterality Date   CHOLECYSTECTOMY  2015   SINUS IRRIGATION     Family History  Problem Relation Age of Onset   Bipolar disorder Mother    Diabetes Mother    Hypertension Mother    Heart disease Mother    Depression Mother    Obesity Mother    COPD Father    Depression Father    Early death Father    Heart disease Father    Kidney disease Father    Cardiomyopathy Father    Arthritis Father    Hypertension Father    Bipolar disorder Sister    Diabetes  Brother    Heart disease Brother    Diabetes Maternal Grandmother    Bipolar disorder Maternal Grandfather    Diabetes Paternal Grandmother    Diabetes Paternal Grandfather    Hypertension Daughter    Depression Daughter    Diabetes Daughter    Rectal cancer Maternal Aunt    Stomach cancer Neg Hx    Liver disease Neg Hx    Colon cancer Neg Hx    Pancreatic cancer Neg Hx    Esophageal cancer Neg Hx    Allergies as of 06/11/2022       Reactions   Neosporin [neomycin-bacitracin Zn-polymyx] Rash   Tetracycline Anaphylaxis   Neomycin    Other    Glutin        Medication List        Accurate as of June 11, 2022 11:56 AM. If you have any questions, ask your nurse or doctor.          bisoprolol 10 MG tablet Commonly known as: ZEBETA Take 1 tablet (10 mg total) by mouth daily.   cetirizine 10 MG tablet Commonly known as: ZYRTEC Take 10 mg by mouth daily.   cyanocobalamin 1000 MCG tablet Commonly known as: VITAMIN B12 Take 3,000 mcg by mouth daily.   ezetimibe 10 MG tablet Commonly known as: Zetia Take 1 tablet (10 mg total) by mouth daily.   Iron 325 (65 Fe) MG  Tabs Take 1 tablet by mouth daily.   Magnesium 400 MG Tabs Take 1 tablet by mouth daily.   metFORMIN 500 MG tablet Commonly known as: GLUCOPHAGE Take 1 tablet (500 mg total) by mouth daily after lunch.   SUPER ENZYMES PO Take 1 tablet by mouth daily.   TURMERIC PO Take 1 tablet by mouth daily.   Vitamin D (Ergocalciferol) 1.25 MG (50000 UNIT) Caps capsule Commonly known as: DRISDOL Take 1 capsule (50,000 Units total) by mouth every 7 (seven) days.   Zinc 50 MG Tabs Take 1 tablet by mouth daily.        All past medical history, surgical history, allergies, family history, immunizations andmedications were updated in the EMR today and reviewed under the history and medication portions of their EMR.     Review of Systems  Constitutional:  Negative for chills, fever and malaise/fatigue.  Neurological:  Negative for dizziness and headaches.   Negative, with the exception of above mentioned in HPI   Objective:  BP 122/80   Pulse 65   Temp 98.2 F (36.8 C) (Oral)   Ht 5' 7"  (1.702 m)   Wt (!) 306 lb (138.8 kg)   LMP 02/13/2018   SpO2 96%   BMI 47.93 kg/m  Body mass index is 47.93 kg/m. Physical Exam Vitals and nursing note reviewed.  Constitutional:      General: She is not in acute distress.    Appearance: Normal appearance. She is normal weight. She is not ill-appearing or toxic-appearing.  Eyes:     Extraocular Movements: Extraocular movements intact.     Conjunctiva/sclera: Conjunctivae normal.     Pupils: Pupils are equal, round, and reactive to light.  Skin:    Findings: Rash present.          Comments: Telangiectasia of back in a linear distribution, sparing under the bra line.  Blue- multiple insect bits  Neurological:     Mental Status: She is alert and oriented to person, place, and time. Mental status is at baseline.  Psychiatric:  Mood and Affect: Mood normal.        Behavior: Behavior normal.        Thought Content: Thought content  normal.        Judgment: Judgment normal.      No results found. No results found. No results found for this or any previous visit (from the past 24 hour(s)).  Assessment/Plan: Kelly Perez is a 56 y.o. female present for OV for  Telangiectasia On presentation, uncertain if this is all on acute presentation.  It is in a linear distribution sparing the bra line which would suggest injury/pressure causing broken blood vessels.  She denies any injury today. Recent lab work appointment suggest concern. Patient was reassured this is usually a cosmetic issue only.  Insect bite of back wall of thorax, unspecified location, initial encounter She does have a couple insect bites on her back and arm, uncertain if related to her scratching/itching.  Reviewed expectations re: course of current medical issues. Discussed self-management of symptoms. Outlined signs and symptoms indicating need for more acute intervention. Patient verbalized understanding and all questions were answered. Patient received an After-Visit Summary.    No orders of the defined types were placed in this encounter.  No orders of the defined types were placed in this encounter.  Referral Orders  No referral(s) requested today     Note is dictated utilizing voice recognition software. Although note has been proof read prior to signing, occasional typographical errors still can be missed. If any questions arise, please do not hesitate to call for verification.   electronically signed by:  Howard Pouch, DO  [place]

## 2022-06-11 NOTE — Patient Instructions (Signed)
This is broken blood vessels and not concerning unless worsens.

## 2022-06-29 ENCOUNTER — Ambulatory Visit (INDEPENDENT_AMBULATORY_CARE_PROVIDER_SITE_OTHER): Payer: 59 | Admitting: Family Medicine

## 2022-06-29 ENCOUNTER — Encounter (INDEPENDENT_AMBULATORY_CARE_PROVIDER_SITE_OTHER): Payer: Self-pay | Admitting: Family Medicine

## 2022-06-29 VITALS — BP 115/70 | HR 62 | Temp 97.9°F | Ht 67.0 in | Wt 305.0 lb

## 2022-06-29 DIAGNOSIS — K900 Celiac disease: Secondary | ICD-10-CM

## 2022-06-29 DIAGNOSIS — E559 Vitamin D deficiency, unspecified: Secondary | ICD-10-CM | POA: Diagnosis not present

## 2022-06-29 DIAGNOSIS — E1169 Type 2 diabetes mellitus with other specified complication: Secondary | ICD-10-CM | POA: Diagnosis not present

## 2022-06-29 DIAGNOSIS — Z7984 Long term (current) use of oral hypoglycemic drugs: Secondary | ICD-10-CM

## 2022-06-29 DIAGNOSIS — E669 Obesity, unspecified: Secondary | ICD-10-CM

## 2022-06-29 DIAGNOSIS — Z6841 Body Mass Index (BMI) 40.0 and over, adult: Secondary | ICD-10-CM

## 2022-06-29 MED ORDER — VITAMIN D (ERGOCALCIFEROL) 1.25 MG (50000 UNIT) PO CAPS: 50000.0000 [IU] | ORAL_CAPSULE | ORAL | 0 refills | Status: DC

## 2022-06-29 MED ORDER — METFORMIN HCL 500 MG PO TABS: 500.0000 mg | ORAL_TABLET | Freq: Every day | ORAL | 0 refills | Status: DC

## 2022-07-07 NOTE — Progress Notes (Signed)
Chief Complaint:   OBESITY Kelly Perez is here to discuss her progress with her obesity treatment plan along with follow-up of her obesity related diagnoses. Kelly Perez is on following a lower carbohydrate, vegetable and lean protein rich diet plan and states she is following her eating plan approximately 80% of the time. Kinda states she is walking for 20 minutes 7 times per week.  Today's visit was #: 60 Starting weight: 372 lbs Starting date: 06/17/2020 Today's weight: 305 lbs Today's date: 06/29/2022 Total lbs lost to date: 37 Total lbs lost since last in-office visit: 0  Interim History: Kelly Perez has been especially busy at work and she has company and has had extra temptations that she normally does not have.  She has done well with minimizing weight gain.  Subjective:   1. Celiac disease [place] accidentally ate chicken strips with gluten and has had some vomiting, and she feels it is taking a bit longer to recover than normal.  2. Vitamin D deficiency Kelly Perez is stable on vitamin D, with no signs of over replacement.  3. Type 2 diabetes mellitus with other specified complication, without long-term current use of insulin (Kelly Perez) Kelly Perez is stable on metformin, and she requests a refill today.  Assessment/Plan:   1. Celiac disease Kelly Perez is to continue to hydrate and and we will continue to follow.  She will make sure to thoroughly check foods to avoid this in the future.  2. Vitamin D deficiency Kelly Perez will continue prescription vitamin D 50,000 units once weekly, and we will refill for 1 month.  - Vitamin D, Ergocalciferol, (DRISDOL) 1.25 MG (50000 UNIT) CAPS capsule; Take 1 capsule (50,000 Units total) by mouth every 7 (seven) days.  Dispense: 4 capsule; Refill: 0  3. Type 2 diabetes mellitus with other specified complication, without long-term current use of insulin (Kelly Perez) Kelly Perez will continue metformin 500 mg once daily with lunch, and we will refill for 1 month.  - metFORMIN  (GLUCOPHAGE) 500 MG tablet; Take 1 tablet (500 mg total) by mouth daily after lunch.  Dispense: 30 tablet; Refill: 0  4. Obesity, Current BMI 47.9 Kelly Perez is currently in the action stage of change. As such, her goal is to continue with weight loss efforts. She has agreed to following a lower carbohydrate, vegetable and lean protein rich diet plan.   Exercise goals: As is.   Behavioral modification strategies: increasing lean protein intake and meal planning and cooking strategies.  Kelly Perez has agreed to follow-up with our clinic in 4 weeks. She was informed of the importance of frequent follow-up visits to maximize her success with intensive lifestyle modifications for her multiple health conditions.   Objective:   Blood pressure 115/70, pulse 62, temperature 97.9 F (36.6 C), height 5' 7"  (1.702 m), weight (!) 305 lb (138.3 kg), last menstrual period 02/13/2018, SpO2 96 %. Body mass index is 47.77 kg/m.  General: Cooperative, alert, well developed, in no acute distress. HEENT: Conjunctivae and lids unremarkable. Cardiovascular: Regular rhythm.  Lungs: Normal work of breathing. Neurologic: No focal deficits.   Lab Results  Component Value Date   CREATININE 0.59 05/04/2022   BUN 16 05/04/2022   NA 144 05/04/2022   K 5.2 05/04/2022   CL 104 05/04/2022   CO2 25 05/04/2022   Lab Results  Component Value Date   ALT 11 05/04/2022   AST 10 05/04/2022   ALKPHOS 101 05/04/2022   BILITOT 0.3 05/04/2022   Lab Results  Component Value Date   HGBA1C 5.9 (  H) 05/04/2022   HGBA1C 6.4 01/06/2022   HGBA1C 6.2 (H) 09/08/2021   HGBA1C 6.2 (H) 06/11/2021   HGBA1C 6.1 (H) 02/05/2021   Lab Results  Component Value Date   INSULIN 10.5 05/04/2022   INSULIN 12.4 09/08/2021   INSULIN 14.9 06/11/2021   INSULIN 13.9 02/05/2021   INSULIN 15.2 09/29/2020   Lab Results  Component Value Date   TSH 2.340 05/04/2022   Lab Results  Component Value Date   CHOL 198 05/04/2022   HDL 47  05/04/2022   LDLCALC 132 (H) 05/04/2022   TRIG 108 05/04/2022   CHOLHDL 4 06/22/2018   Lab Results  Component Value Date   VD25OH 58.8 05/04/2022   VD25OH 56.0 02/04/2022   VD25OH 59.3 09/08/2021   Lab Results  Component Value Date   WBC 7.8 05/04/2022   HGB 14.0 05/04/2022   HCT 43.9 05/04/2022   MCV 86 05/04/2022   PLT 437 05/04/2022   Lab Results  Component Value Date   IRON 82 01/26/2018   TIBC 391 01/26/2018   FERRITIN 35 01/26/2018   Attestation Statements:   Reviewed by clinician on day of visit: allergies, medications, problem list, medical history, surgical history, family history, social history, and previous encounter notes.   I, Trixie Dredge, am acting as transcriptionist for Dennard Nip, MD.  I have reviewed the above documentation for accuracy and completeness, and I agree with the above. -  Dennard Nip, MD

## 2022-07-23 ENCOUNTER — Other Ambulatory Visit (INDEPENDENT_AMBULATORY_CARE_PROVIDER_SITE_OTHER): Payer: Self-pay | Admitting: Family Medicine

## 2022-07-23 DIAGNOSIS — E1169 Type 2 diabetes mellitus with other specified complication: Secondary | ICD-10-CM

## 2022-07-27 ENCOUNTER — Encounter (INDEPENDENT_AMBULATORY_CARE_PROVIDER_SITE_OTHER): Payer: Self-pay | Admitting: Family Medicine

## 2022-07-27 ENCOUNTER — Ambulatory Visit (INDEPENDENT_AMBULATORY_CARE_PROVIDER_SITE_OTHER): Payer: 59 | Admitting: Family Medicine

## 2022-07-27 VITALS — BP 130/71 | HR 60 | Temp 98.0°F | Ht 67.0 in | Wt 309.0 lb

## 2022-07-27 DIAGNOSIS — E669 Obesity, unspecified: Secondary | ICD-10-CM

## 2022-07-27 DIAGNOSIS — E1169 Type 2 diabetes mellitus with other specified complication: Secondary | ICD-10-CM

## 2022-07-27 DIAGNOSIS — E559 Vitamin D deficiency, unspecified: Secondary | ICD-10-CM

## 2022-07-27 DIAGNOSIS — Z7985 Long-term (current) use of injectable non-insulin antidiabetic drugs: Secondary | ICD-10-CM

## 2022-07-27 DIAGNOSIS — Z6841 Body Mass Index (BMI) 40.0 and over, adult: Secondary | ICD-10-CM

## 2022-07-27 DIAGNOSIS — K900 Celiac disease: Secondary | ICD-10-CM | POA: Diagnosis not present

## 2022-07-27 MED ORDER — VITAMIN D (ERGOCALCIFEROL) 1.25 MG (50000 UNIT) PO CAPS: 50000.0000 [IU] | ORAL_CAPSULE | ORAL | 0 refills | Status: DC

## 2022-07-27 MED ORDER — METFORMIN HCL 500 MG PO TABS: 500.0000 mg | ORAL_TABLET | Freq: Every day | ORAL | 0 refills | Status: DC

## 2022-08-03 NOTE — Progress Notes (Signed)
Chief Complaint:   OBESITY Kelly Perez is here to discuss her progress with her obesity treatment plan along with follow-up of her obesity related diagnoses. Kelly Perez is on following a lower carbohydrate, vegetable and lean protein rich diet plan and states she is following her eating plan approximately 50-60% of the time. Kelly Perez states she is walking for 20 minutes 1 time per week.  Today's visit was #: 52 Starting weight: 372 lbs Starting date: 06/17/2020 Today's weight: 309 lbs Today's date: 07/27/2022 Total lbs lost to date: 63 Total lbs lost since last in-office visit: 0  Interim History: Kelly Perez has done well overall with weight loss.  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  She has friends staying with her and she notes increased snacks in the household recently.  She plans to get back on track as her friend is leaving soon.  Subjective:   1. Vitamin D deficiency Kelly Perez is taking vitamin D 50,000 units once weekly, with no side effects noted.  2. Type 2 diabetes mellitus with other specified complication, without long-term current use of insulin ([facility]) Kelly Perez is taking metformin 500 mg once daily with lunch.  No side effects were noted.  Her last A1c was 5.9.  3. Celiac disease Kelly Perez's symptoms are well controlled currently with strict diet and gluten-free diet.  Assessment/Plan:   1. Vitamin D deficiency We will refill prescription Vitamin D 50,000 IU every week for 1 month.  - Vitamin D, Ergocalciferol, (DRISDOL) 1.25 MG (50000 UNIT) CAPS capsule; Take 1 capsule (50,000 Units total) by mouth every 7 (seven) days.  Dispense: 4 capsule; Refill: 0  2. Type 2 diabetes mellitus with other specified complication, without long-term current use of insulin ([facility]) Kelly Perez will continue metformin 500 mg once daily with lunch, and we will refill for 1 month.  We will recheck labs at her next visit.  - metFORMIN (GLUCOPHAGE) 500 MG tablet; Take 1 tablet (500 mg total)  by mouth daily after lunch.  Dispense: 30 tablet; Refill: 0  3. Celiac disease Kelly Perez has a follow-up with GI scheduled over the next month.  4. Obesity, Current BMI 48.5 Kelly Perez is currently in the action stage of change. As such, her goal is to continue with weight loss efforts. She has agreed to following a lower carbohydrate, vegetable and lean protein rich diet plan.   Exercise goals: As is.   Behavioral modification strategies: increasing lean protein intake, decreasing simple carbohydrates, better snacking choices, and planning for success.  Kelly Perez has agreed to follow-up with our clinic in 4 weeks. She was informed of the importance of frequent follow-up visits to maximize her success with intensive lifestyle modifications for her multiple health conditions.   Objective:   Blood pressure 130/71, pulse 60, temperature 98 F (36.7 C), height 5' 7"  (1.702 m), weight (!) 309 lb (140.2 kg), last menstrual period 02/13/2018, SpO2 95 %. Body mass index is 48.4 kg/m.  General: Cooperative, alert, well developed, in no acute distress. HEENT: Conjunctivae and lids unremarkable. Cardiovascular: Regular rhythm.  Lungs: Normal work of breathing. Neurologic: No focal deficits.   Lab Results  Component Value Date   CREATININE 0.59 05/04/2022   BUN 16 05/04/2022   NA 144 05/04/2022   K 5.2 05/04/2022   CL 104 05/04/2022   CO2 25 05/04/2022   Lab Results  Component Value Date   ALT 11 05/04/2022   AST 10 05/04/2022   ALKPHOS 101 05/04/2022   BILITOT 0.3 05/04/2022  Lab Results  Component Value Date   HGBA1C 5.9 (H) 05/04/2022   HGBA1C 6.4 01/06/2022   HGBA1C 6.2 (H) 09/08/2021   HGBA1C 6.2 (H) 06/11/2021   HGBA1C 6.1 (H) 02/05/2021   Lab Results  Component Value Date   INSULIN 10.5 05/04/2022   INSULIN 12.4 09/08/2021   INSULIN 14.9 06/11/2021   INSULIN 13.9 02/05/2021   INSULIN 15.2 09/29/2020   Lab Results  Component Value Date   TSH 2.340 05/04/2022   Lab Results   Component Value Date   CHOL 198 05/04/2022   HDL 47 05/04/2022   LDLCALC 132 (H) 05/04/2022   TRIG 108 05/04/2022   CHOLHDL 4 06/22/2018   Lab Results  Component Value Date   VD25OH 58.8 05/04/2022   VD25OH 56.0 02/04/2022   VD25OH 59.3 09/08/2021   Lab Results  Component Value Date   WBC 7.8 05/04/2022   HGB 14.0 05/04/2022   HCT 43.9 05/04/2022   MCV 86 05/04/2022   PLT 437 05/04/2022   Lab Results  Component Value Date   IRON 82 01/26/2018   TIBC 391 01/26/2018   FERRITIN 35 01/26/2018   Attestation Statements:   Reviewed by clinician on day of visit: allergies, medications, problem list, medical history, surgical history, family history, social history, and previous encounter notes.   I, Trixie Dredge, am acting as transcriptionist for Dennard Nip, MD.  I have reviewed the above documentation for accuracy and completeness, and I agree with the above. -  Dennard Nip, MD

## 2022-08-04 ENCOUNTER — Encounter: Payer: Self-pay | Admitting: Gastroenterology

## 2022-08-04 ENCOUNTER — Ambulatory Visit (INDEPENDENT_AMBULATORY_CARE_PROVIDER_SITE_OTHER): Payer: 59 | Admitting: Gastroenterology

## 2022-08-04 VITALS — BP 140/72 | HR 56 | Ht 66.0 in | Wt 321.1 lb

## 2022-08-04 DIAGNOSIS — Z1211 Encounter for screening for malignant neoplasm of colon: Secondary | ICD-10-CM | POA: Diagnosis not present

## 2022-08-04 DIAGNOSIS — K900 Celiac disease: Secondary | ICD-10-CM

## 2022-08-04 DIAGNOSIS — R748 Abnormal levels of other serum enzymes: Secondary | ICD-10-CM | POA: Diagnosis not present

## 2022-08-04 NOTE — Progress Notes (Addendum)
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 Colonoscopy recommended.   Celiac Disease: She reports a lifelong adherence to a gluten-free diet. Serologies normal last year. Consider serologies to monitor the response to gluten-free diet with any recurrent symptoms. DEXA to evaluate for associated bone disease is recommended.   Abnormal ALT with fatty liver on ultrasound: Suspected NAFLD by NASH FibroSURE, although celiac can cause an elevated ALT. Autoimmune hepatitis labs negative. Liver enzymes have since normalized. No additional testing recommended at this time. She continues her effort with weight loss through [facility].  PLAN: - DEXA scan - Colonoscopy  - At least annual office follow-up    PLAN:  Please see the "Patient Instructions" section for addition details about the plan.  HPI: Kelly Perez is a 56 y.o. female who returns in follow-up of celiac disease and abnormal liver enzymes. She was initially seen in consultation 04/14/20 and last seen 07/17/21. The interval history is obtained through the patient and review of her electronic health record. She has obesity and is followed at the [facility], vitamin d deficiency, and type 2 diabetes. She has a distant history of H pylori. She had a cholecystectomy for gallstones in  2015.  She was diagnosed with celiac in 2012 during the evaluation of frequent episodes of nausea, joint pain and rash. She follows a strict gluten free diet to manage her symptoms. No follow-up celiac labs have been performed. Diagnosed in 2013 at [facility]. No constipation on a gluten free diet.   While receiving regular care with the [facility], she was found to have an elevated ALT in 2021.  Abdominal ultrasound showed fatty liver.  Additional testing in June 2022 showed a normal or negative ANA, F-actin, AMA, IgA, IgG, TTGA, hepatitis B core antibody. IgM was low.  [facility] FibroSure showed F0 fibrosis, S3 steatosis, and borderline or probable Nash.  Her liver enzymes normalized in 2022 and remained normal on recent testing.  She returns in follow-up. She has lost close to 70 pounds with [facility]. Her GI ROS is stable. No new symptoms beyond symptoms when she has exposure to gluten. However, she follows a strict celiac diet.     Prior endoscopy: - EGD 06/21/12: grade 1 esophagitis, small hiatal hernia, gastroduodenitis, duodenal biopsies showed increased intraepithelial lymphocytes without villous blunting [name] classification 1); gastric biopsies were normal - Colonoscopy 06/21/2012 [referenced in documents from [place] but neither the procedure note nor the pathology findings were included]   Celiac labs: 2013:TTGA 1.4, Endomysial Ab negative, IgA 188, Gliadin IgG 5, Gliadin IgA 149 2014: ANA 1:40, CRP 22.1 2022: TTGA 1.9, IgA 129   Abdominal imaging: -Abdominal ultrasound 07/02/14 to evaluate right upper quadrant pain: Echogenic liver,  multiple gallstones -Abdominal ultrasound 03/24/21 to evaluate abnormal liver enzymes: Prior cholecystectomy, fatty liver   Past Medical History:  Diagnosis Date   Allergy    Anemia    Anxiety    Arthritis    Bilateral primary osteoarthritis of knee 01/26/2018   Celiac disease 01/26/2018    Chronic depression 01/26/2018   Constipation    Food allergy    Gallbladder problem    GERD (gastroesophageal reflux disease)    Hypertension    Morbid obesity ([place]) 01/26/2018   Osteoarthritis    PTSD (post-traumatic stress disorder)    Sleep apnea    cpap   Vitamin B12 deficiency    due to celiac   Vitamin D deficiency     Past Surgical History:  Procedure Laterality Date   CHOLECYSTECTOMY  2015   SINUS IRRIGATION      Current Outpatient Medications  Medication Sig Dispense Refill   bisoprolol (ZEBETA) 10 MG tablet Take 1 tablet (10 mg total) by mouth daily. 90 tablet 1   cetirizine (ZYRTEC) 10 MG tablet Take 10 mg by mouth daily.     Digestive Enzymes (SUPER ENZYMES PO) Take 1 tablet by mouth daily.     ezetimibe (ZETIA) 10 MG tablet Take 1 tablet (10 mg total) by mouth daily. 90 tablet 3   Ferrous Sulfate (IRON) 325 (65 Fe) MG TABS Take 1 tablet by mouth daily.     Magnesium 400 MG TABS Take 1 tablet by mouth daily.     metFORMIN (GLUCOPHAGE) 500 MG tablet Take 1 tablet (500 mg total) by mouth daily after lunch. 30 tablet 0   TURMERIC PO Take 1 tablet by mouth daily.     vitamin B-12 (CYANOCOBALAMIN) 1000 MCG tablet Take 3,000 mcg by mouth daily.      Vitamin D, Ergocalciferol, (DRISDOL) 1.25 MG (50000 UNIT) CAPS capsule Take 1 capsule (50,000 Units total) by mouth every 7 (seven) days. 4 capsule 0   Zinc 50 MG TABS Take 1 tablet by mouth daily.     No current facility-administered medications for this visit.    Allergies as of 08/04/2022 - Review Complete 07/27/2022  Allergen Reaction Noted   Neosporin [neomycin-bacitracin zn-polymyx] Rash 01/26/2018   Tetracycline Anaphylaxis 12/01/2017   Neomycin  06/22/2018   Other  12/01/2017    Family History  Problem Relation Age of Onset   Bipolar disorder Mother    Diabetes Mother    Hypertension Mother    Heart disease Mother    Depression Mother    Obesity Mother    COPD Father    Depression Father    Early  death Father    Heart disease Father    Kidney disease Father    Cardiomyopathy Father    Arthritis Father    Hypertension Father    Bipolar disorder Sister    Diabetes Brother    Heart disease Brother    Diabetes Maternal Grandmother    Bipolar disorder Maternal Grandfather    Diabetes Paternal Grandmother    Diabetes Paternal Grandfather    Hypertension Daughter    Depression Daughter    Diabetes Daughter    Rectal cancer Maternal Aunt    Stomach cancer Neg Hx    Liver disease Neg Hx    Colon cancer Neg Hx    Pancreatic cancer Neg Hx    Esophageal cancer Neg Hx      Physical Exam: General:   Alert,  well-nourished, pleasant and cooperative in NAD Head:  Normocephalic and  atraumatic. Eyes:  Sclera clear, no icterus.   Conjunctiva pink. Abdomen:  Soft, central obesity, nontender, nondistended, normal bowel sounds, no rebound or guarding. No hepatosplenomegaly.   Neurologic:  Alert and  oriented x4;  grossly nonfocal Skin:  Intact without significant lesions or rashes. No palmar erythema or spider angioma.  Psych:  Alert and cooperative. Normal mood and affect.     Jossue Rubenstein L. Tarri Glenn, MD, MPH 08/04/2022, 10:57 AM

## 2022-08-04 NOTE — Patient Instructions (Addendum)
It was a pleasure to see you today.  Congratulations on how well you have followed a gluten-free diet.  Because celiac can be associated with bone weakness, I recommend a bone density test for screening.  Your family also need to be aware of their increased risk of celiac disease. Your first-degree relatives (parents, brothers, sisters, children) should consider being tested, especially if anyone has signs or symptoms of the condition. Testing is typically done with a blood test.   It is time for a screening colonoscopy.  Thankfully, your liver enzymes have normalized.  I think this reflects underlying fatty liver with recent weight loss.  Congratulations for all of your hard work with healthy weight and wellness.   Please go to radiology on the basement floor of [facility] location for this test. Please contact radiology at [phone] to schedule your appointment.   Preparation for test is as follows:  If you are taking calcium, discontinue this 24-48 hours prior to your appointment.  Wear pants with an elastic waistband (or without any metal such as a zipper).  Do not wear an underwire bra.  We do have gowns if you are unable to find appropriate clothing without metal.  Please bring a list of all current medications.  COLONOSCOPY:   You have been scheduled for a colonoscopy. Please follow written instructions given to you at your visit today.   PREP:   Please pick up your prep supplies at the pharmacy within the next 1-3 days.  INHALERS:   If you use inhalers (even only as needed), please bring them with you on the day of your procedure.  COLONOSCOPY TIPS:  To reduce nausea and dehydration, stay well hydrated for 3-4 days prior to the exam.  To prevent skin/hemorrhoid irritation - prior to wiping, put A&Dointment or vaseline on the toilet paper. Keep a towel or pad on the bed.  BEFORE STARTING YOUR PREP, drink  64oz of clear liquids in the morning. This  will help to flush the colon and will ensure you are well hydrated!!!!  NOTE - This is in addition to the fluids required for to complete your prep. Use of a flavored hard candy, such as grape Anise Salvo, can counteract some of the flavor of the prep and may prevent some nausea.

## 2022-08-06 ENCOUNTER — Telehealth: Payer: Self-pay | Admitting: Family Medicine

## 2022-08-06 NOTE — Telephone Encounter (Signed)
Please call patient: I received an order for her bone density from [facility].   Please ask her if she has requested to have a bone density completed?   -Bone density screening usually starts between 60-65 in females-unless they have significant cause to have bone density screening sooner-example: Hyperthyroid, recurrent fractures, chronic steroid use, smoker etc.

## 2022-08-10 NOTE — Telephone Encounter (Signed)
Then please have [facility] send to Dr. Tarri Glenn for signature. thx

## 2022-08-10 NOTE — Telephone Encounter (Signed)
Spoke with patient regarding results/recommendations.  

## 2022-08-18 ENCOUNTER — Other Ambulatory Visit (INDEPENDENT_AMBULATORY_CARE_PROVIDER_SITE_OTHER): Payer: Self-pay | Admitting: Family Medicine

## 2022-08-18 DIAGNOSIS — E1169 Type 2 diabetes mellitus with other specified complication: Secondary | ICD-10-CM

## 2022-08-26 ENCOUNTER — Encounter (INDEPENDENT_AMBULATORY_CARE_PROVIDER_SITE_OTHER): Payer: Self-pay | Admitting: Family Medicine

## 2022-08-26 ENCOUNTER — Ambulatory Visit (INDEPENDENT_AMBULATORY_CARE_PROVIDER_SITE_OTHER): Payer: 59 | Admitting: Family Medicine

## 2022-08-26 VITALS — BP 146/78 | HR 52 | Temp 98.1°F | Ht 67.0 in | Wt 318.0 lb

## 2022-08-26 DIAGNOSIS — K900 Celiac disease: Secondary | ICD-10-CM

## 2022-08-26 DIAGNOSIS — E559 Vitamin D deficiency, unspecified: Secondary | ICD-10-CM

## 2022-08-26 DIAGNOSIS — E669 Obesity, unspecified: Secondary | ICD-10-CM | POA: Diagnosis not present

## 2022-08-26 DIAGNOSIS — Z6841 Body Mass Index (BMI) 40.0 and over, adult: Secondary | ICD-10-CM

## 2022-08-26 DIAGNOSIS — E1169 Type 2 diabetes mellitus with other specified complication: Secondary | ICD-10-CM

## 2022-08-26 DIAGNOSIS — Z7984 Long term (current) use of oral hypoglycemic drugs: Secondary | ICD-10-CM

## 2022-08-26 MED ORDER — METFORMIN HCL 500 MG PO TABS: 500.0000 mg | ORAL_TABLET | Freq: Every day | ORAL | 0 refills | Status: DC

## 2022-08-26 MED ORDER — VITAMIN D (ERGOCALCIFEROL) 1.25 MG (50000 UNIT) PO CAPS: 50000.0000 [IU] | ORAL_CAPSULE | ORAL | 0 refills | Status: DC

## 2022-08-28 IMAGING — US US ABDOMEN COMPLETE
1 series · 14 of 25 positions shown · non-contrast
Comparison: No prior.

CLINICAL DATA: Elevated liver function test.

EXAM:
ABDOMEN ULTRASOUND COMPLETE

[Series 1: us abdomen complete · 0.33mm/px · 14 of 91 slices shown]
[im 1/91]
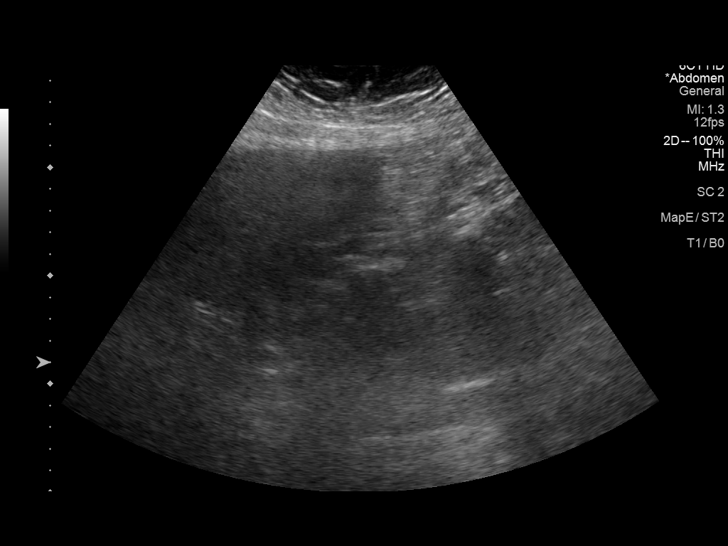
[im 8/91]
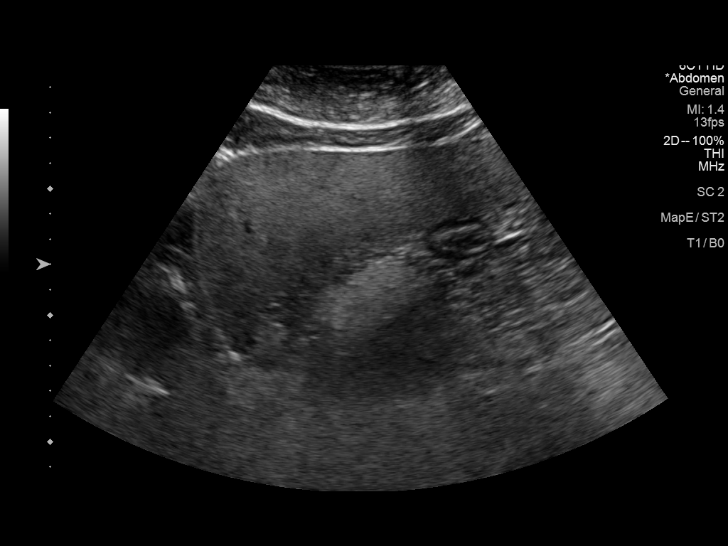
[im 16/91]
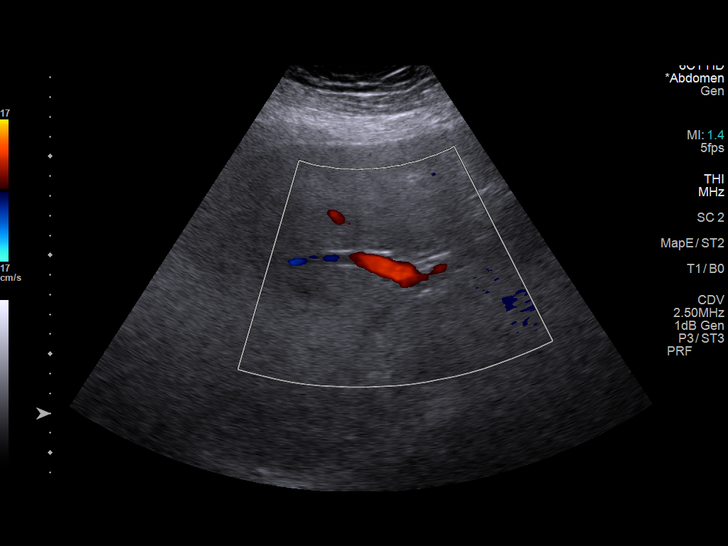
[im 23/91]
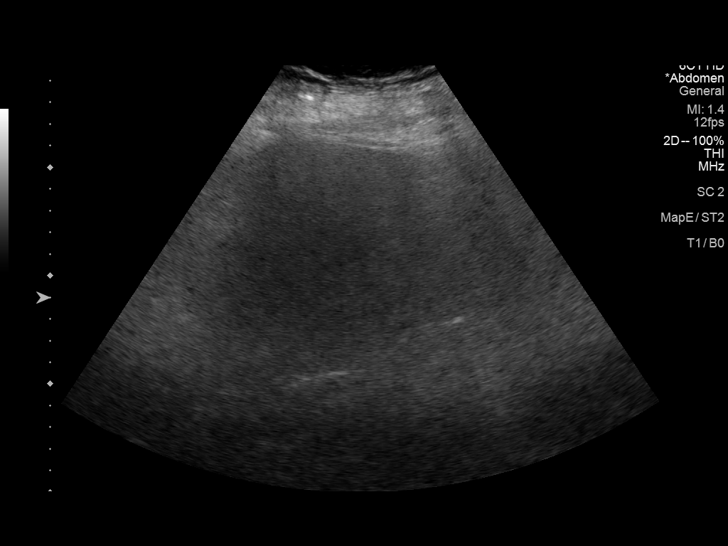
[im 31/91]
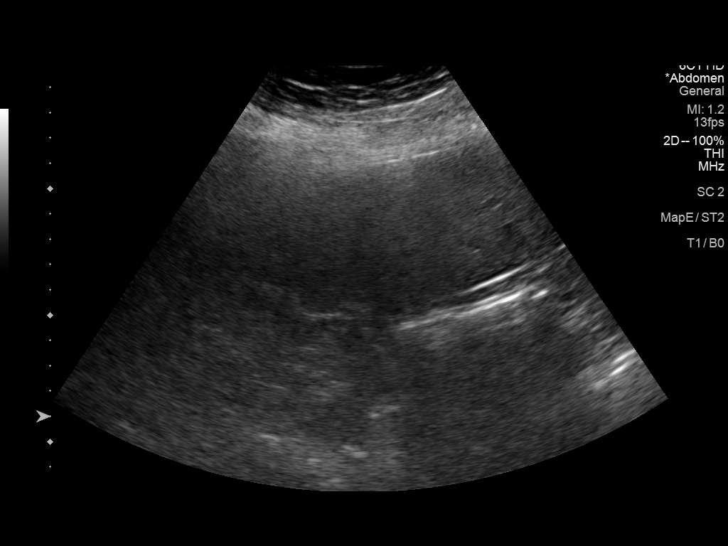
[im 34/91]
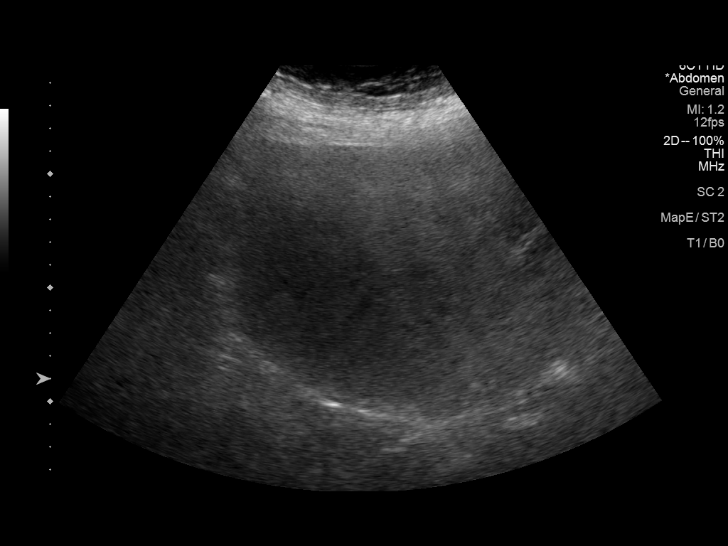
[im 42/91]
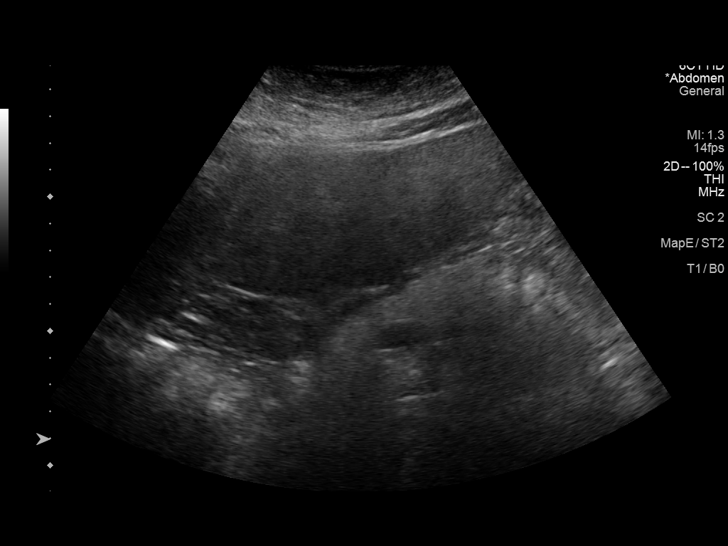
[im 49/91]
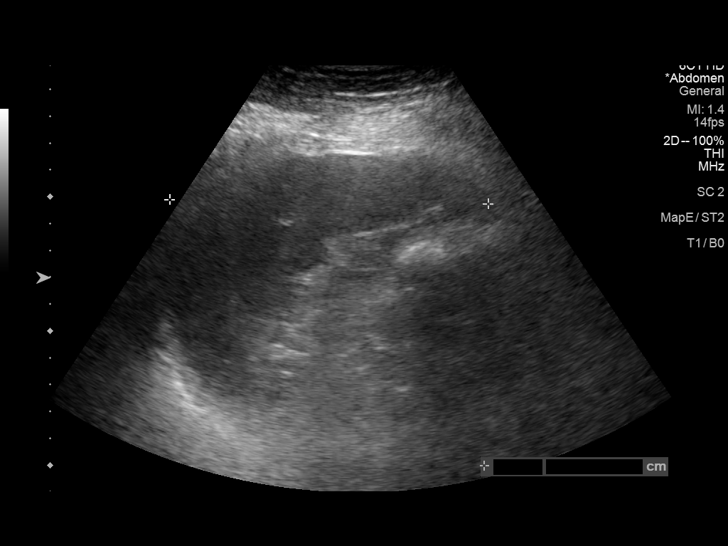
[im 57/91]
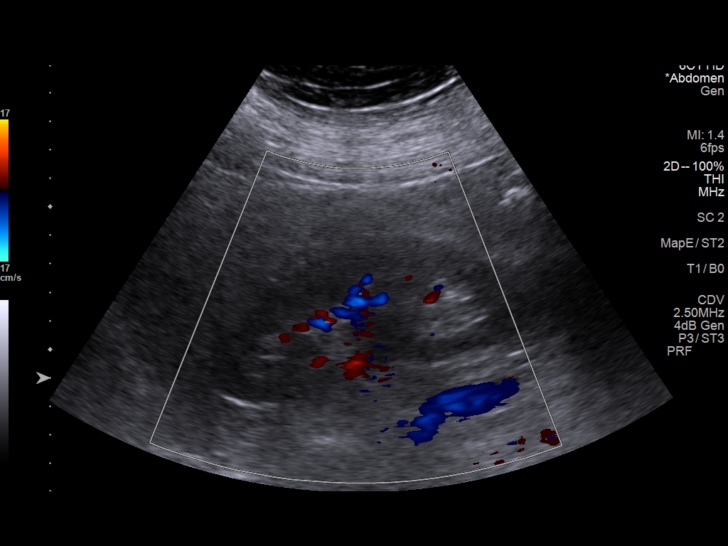
[im 61/91]
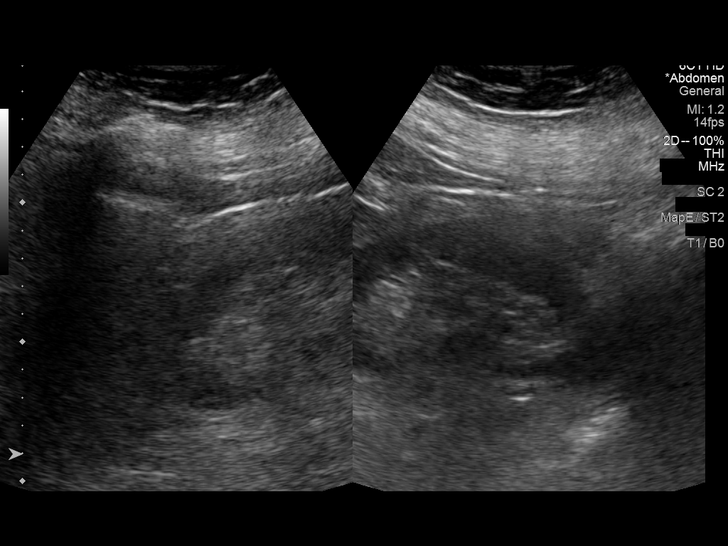
[im 68/91]
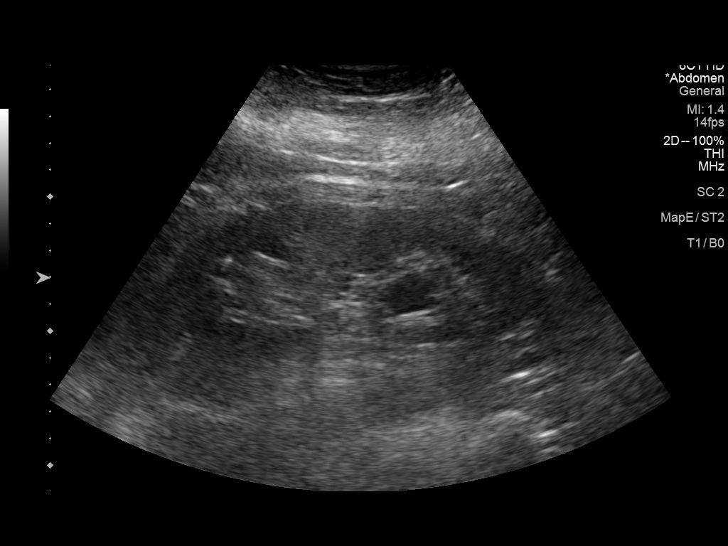
[im 76/91]
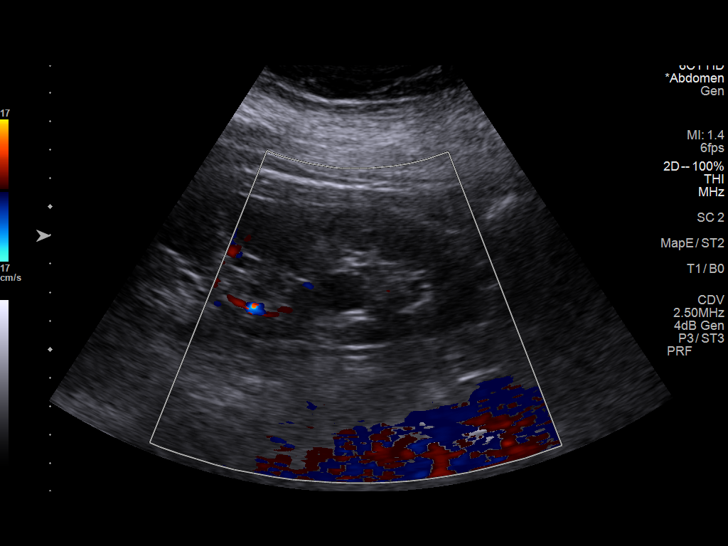
[im 83/91]
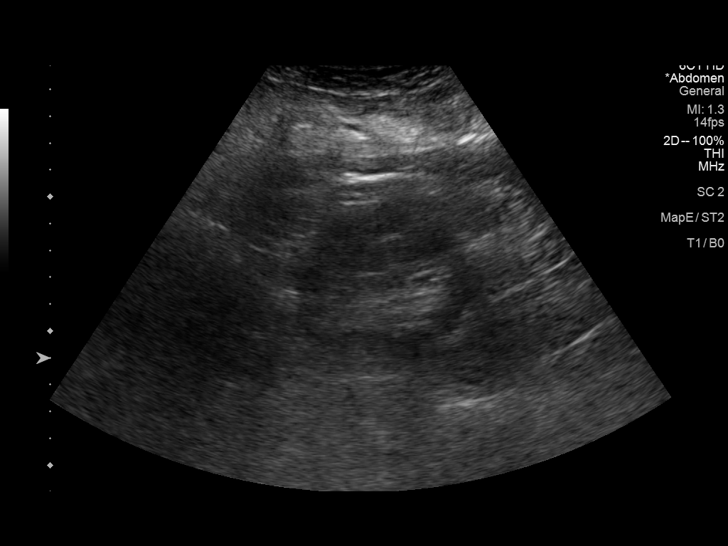
[im 91/91]
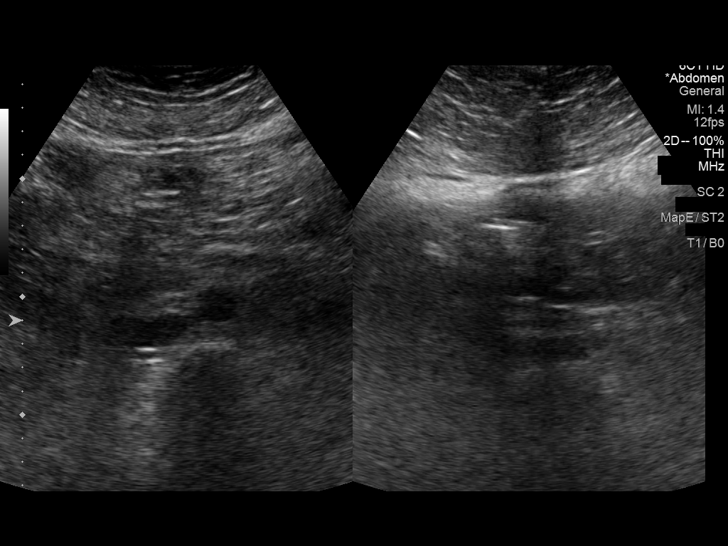

[14 of 25 positions shown; findings below may reference images not displayed]

FINDINGS: Gallbladder: Cholecystectomy.

Common bile duct: Diameter: 3.5 mm

Liver: Heterogeneous echogenic hepatic parenchymal pattern
consistent fatty infiltration or hepatocellular disease. No focal
hepatic abnormality identified. Portal vein is patent on color
Doppler imaging with normal direction of blood flow towards the
liver.

IVC: No abnormality visualized.

Pancreas: Visualized portion unremarkable.

Spleen: Size and appearance within normal limits.

Right Kidney: Length: 13.2 cm. Echogenicity within normal limits. No
mass or hydronephrosis visualized.

Left Kidney: Length: 14.1 cm. Echogenicity within normal limits.
cm simple appearing cyst, possibly a parapelvic cyst. No
hydronephrosis visualized.

Abdominal aorta: No aneurysm visualized.

Other findings: None.
IMPRESSION: 1.  Cholecystectomy.  No biliary distention.

2. Heterogeneous echogenic hepatic parenchymal pattern consistent
with fatty infiltration or hepatocellular disease. No focal hepatic
abnormality identified.

## 2022-08-30 LAB — HM PAP SMEAR

## 2022-09-01 ENCOUNTER — Telehealth: Payer: Self-pay

## 2022-09-01 NOTE — Telephone Encounter (Signed)
Received bone density results from [facility]. Placed on PCP desk.

## 2022-09-01 NOTE — Telephone Encounter (Addendum)
Please call patient and inform her her bone density is normal.  Please abstract results in health maintenance

## 2022-09-06 NOTE — Progress Notes (Unsigned)
Chief Complaint:   OBESITY Kelly Perez is here to discuss her progress with her obesity treatment plan along with follow-up of her obesity related diagnoses. [name] is on following a lower carbohydrate, vegetable and lean protein rich diet plan and states she is following her eating plan approximately 75% of the time. [name] states she is walking for 20 minutes 7 times per week.  Today's visit was #: 55 Starting weight: 372 lbs Starting date: 06/17/2020 Today's weight: 318 lbs Today's date: 08/26/2022 Total lbs lost to date: 54 Total lbs lost since last in-office visit: 0  Interim History: Kelly Perez has done well overall with her weight loss. She had a recent travel to [place]. She feels she is retaining more fluid, and suspects recent weight gain is related to fluid retention. Her blood pressure is also elevated today and she feels related to increase her work stress.   Subjective:   1. Type 2 diabetes mellitus with other specified complication, without long-term current use of insulin (HCC) Cortney's last A1c was 5.9 and insulin level was 10.5. She is taking metformin with no side effects noted.    2. Vitamin D deficiency Kelly Perez is taking Vitamin D prescription. Her last Vitamin D level was 58.8. no side effects were noted.   3. Celiac disease [name] is followed by GI. Recommended having a bone density study and colonoscopy.   Assessment/Plan:   1. Type 2 diabetes mellitus with other specified complication, without long-term current use of insulin (HCC) Kelly Perez will continue metformin and eating plan, and we will plan to recheck labs before the end of the year. We will refill metformin for 1 month.   - metFORMIN (GLUCOPHAGE) 500 MG tablet; Take 1 tablet (500 mg total) by mouth daily after lunch.  Dispense: 30 tablet; Refill: 0  2. Vitamin D deficiency Kelly Perez will continue prescription Vitamin D 50,000 IU every week, and we will refill for 1 month. We will plan to recheck labs before the end of  the year.   - Vitamin D, Ergocalciferol, (DRISDOL) 1.25 MG (50000 UNIT) CAPS capsule; Take 1 capsule (50,000 Units total) by mouth every 7 (seven) days.  Dispense: 4 capsule; Refill: 0  3. Celiac disease Kelly Perez will follow-up with GI, and she will continue with her healthy eating plan.   4. Obesity, Current BMI 51.4 Kelly Perez is currently in the action stage of change. As such, her goal is to continue with weight loss efforts. She has agreed to following a lower carbohydrate, vegetable and lean protein rich diet plan.   Exercise goals: As is.   Behavioral modification strategies: increasing lean protein intake, decreasing simple carbohydrates, meal planning and cooking strategies, and holiday eating strategies .  Kelly Perez has agreed to follow-up with our clinic in 3 to 4 weeks. She was informed of the importance of frequent follow-up visits to maximize her success with intensive lifestyle modifications for her multiple health conditions.   Objective:   Blood pressure (!) 146/78, pulse (!) 52, temperature 98.1 F (36.7 C), height 5' 7"  (1.702 m), weight (!) 318 lb (144.2 kg), last menstrual period 02/13/2018, SpO2 97 %. Body mass index is 49.81 kg/m.  General: Cooperative, alert, well developed, in no acute distress. HEENT: Conjunctivae and lids unremarkable. Cardiovascular: Regular rhythm.  Lungs: Normal work of breathing. Neurologic: No focal deficits.   Lab Results  Component Value Date   CREATININE 0.59 05/04/2022   BUN 16 05/04/2022   NA 144 05/04/2022   K 5.2 05/04/2022   CL  104 05/04/2022   CO2 25 05/04/2022   Lab Results  Component Value Date   ALT 11 05/04/2022   AST 10 05/04/2022   ALKPHOS 101 05/04/2022   BILITOT 0.3 05/04/2022   Lab Results  Component Value Date   HGBA1C 5.9 (H) 05/04/2022   HGBA1C 6.4 01/06/2022   HGBA1C 6.2 (H) 09/08/2021   HGBA1C 6.2 (H) 06/11/2021   HGBA1C 6.1 (H) 02/05/2021   Lab Results  Component Value Date   INSULIN 10.5 05/04/2022    INSULIN 12.4 09/08/2021   INSULIN 14.9 06/11/2021   INSULIN 13.9 02/05/2021   INSULIN 15.2 09/29/2020   Lab Results  Component Value Date   TSH 2.340 05/04/2022   Lab Results  Component Value Date   CHOL 198 05/04/2022   HDL 47 05/04/2022   LDLCALC 132 (H) 05/04/2022   TRIG 108 05/04/2022   CHOLHDL 4 06/22/2018   Lab Results  Component Value Date   VD25OH 58.8 05/04/2022   VD25OH 56.0 02/04/2022   VD25OH 59.3 09/08/2021   Lab Results  Component Value Date   WBC 7.8 05/04/2022   HGB 14.0 05/04/2022   HCT 43.9 05/04/2022   MCV 86 05/04/2022   PLT 437 05/04/2022   Lab Results  Component Value Date   IRON 82 01/26/2018   TIBC 391 01/26/2018   FERRITIN 35 01/26/2018   Attestation Statements:   Reviewed by clinician on day of visit: allergies, medications, problem list, medical history, surgical history, family history, social history, and previous encounter notes.  I have personally spent 46 minutes total time today in preparation, patient care, and documentation for this visit, including the following: review of clinical lab tests; review of medical tests/procedures/services.   I, Trixie Dredge, am acting as transcriptionist for Dennard Nip, MD.  I have reviewed the above documentation for accuracy and completeness, and I agree with the above. -  Dennard Nip, MD

## 2022-09-09 ENCOUNTER — Encounter: Payer: Self-pay | Admitting: Family Medicine

## 2022-09-24 ENCOUNTER — Other Ambulatory Visit (INDEPENDENT_AMBULATORY_CARE_PROVIDER_SITE_OTHER): Payer: Self-pay | Admitting: Family Medicine

## 2022-09-24 DIAGNOSIS — E559 Vitamin D deficiency, unspecified: Secondary | ICD-10-CM

## 2022-09-27 ENCOUNTER — Encounter (HOSPITAL_COMMUNITY): Payer: Self-pay | Admitting: Gastroenterology

## 2022-09-30 ENCOUNTER — Ambulatory Visit (INDEPENDENT_AMBULATORY_CARE_PROVIDER_SITE_OTHER): Payer: 59 | Admitting: Family Medicine

## 2022-09-30 ENCOUNTER — Encounter (INDEPENDENT_AMBULATORY_CARE_PROVIDER_SITE_OTHER): Payer: Self-pay | Admitting: Family Medicine

## 2022-09-30 VITALS — BP 123/76 | HR 59 | Temp 98.4°F | Ht 67.0 in | Wt 315.0 lb

## 2022-09-30 DIAGNOSIS — E1169 Type 2 diabetes mellitus with other specified complication: Secondary | ICD-10-CM

## 2022-09-30 DIAGNOSIS — E559 Vitamin D deficiency, unspecified: Secondary | ICD-10-CM

## 2022-09-30 DIAGNOSIS — I10 Essential (primary) hypertension: Secondary | ICD-10-CM

## 2022-09-30 DIAGNOSIS — E669 Obesity, unspecified: Secondary | ICD-10-CM | POA: Diagnosis not present

## 2022-09-30 DIAGNOSIS — Z7984 Long term (current) use of oral hypoglycemic drugs: Secondary | ICD-10-CM

## 2022-09-30 DIAGNOSIS — Z6841 Body Mass Index (BMI) 40.0 and over, adult: Secondary | ICD-10-CM

## 2022-09-30 MED ORDER — METFORMIN HCL 500 MG PO TABS: 500.0000 mg | ORAL_TABLET | Freq: Every day | ORAL | 0 refills | Status: DC

## 2022-09-30 MED ORDER — VITAMIN D (ERGOCALCIFEROL) 1.25 MG (50000 UNIT) PO CAPS: 50000.0000 [IU] | ORAL_CAPSULE | ORAL | 0 refills | Status: DC

## 2022-10-01 ENCOUNTER — Encounter (HOSPITAL_COMMUNITY): Payer: Self-pay | Admitting: Gastroenterology

## 2022-10-02 LAB — CMP14+EGFR
ALT: 23 IU/L (ref 0–32)
AST: 15 IU/L (ref 0–40)
Albumin/Globulin Ratio: 1.7 (ref 1.2–2.2)
Albumin: 4.6 g/dL (ref 3.8–4.9)
Alkaline Phosphatase: 107 IU/L (ref 44–121)
BUN/Creatinine Ratio: 30 — ABNORMAL HIGH (ref 9–23)
BUN: 19 mg/dL (ref 6–24)
Bilirubin Total: 0.4 mg/dL (ref 0.0–1.2)
CO2: 22 mmol/L (ref 20–29)
Calcium: 10.1 mg/dL (ref 8.7–10.2)
Chloride: 101 mmol/L (ref 96–106)
Creatinine, Ser: 0.64 mg/dL (ref 0.57–1.00)
Globulin, Total: 2.7 g/dL (ref 1.5–4.5)
Glucose: 108 mg/dL — ABNORMAL HIGH (ref 70–99)
Potassium: 4.9 mmol/L (ref 3.5–5.2)
Sodium: 140 mmol/L (ref 134–144)
Total Protein: 7.3 g/dL (ref 6.0–8.5)
eGFR: 103 mL/min/{1.73_m2} (ref >59)

## 2022-10-02 LAB — HEMOGLOBIN A1C
Est. average glucose Bld gHb Est-mCnc: 123 mg/dL
Hgb A1c MFr Bld: 5.9 % — ABNORMAL HIGH (ref 4.8–5.6)

## 2022-10-02 LAB — LIPID PANEL WITH LDL/HDL RATIO
Cholesterol, Total: 206 mg/dL — ABNORMAL HIGH (ref 100–199)
HDL: 55 mg/dL (ref >39)
LDL Chol Calc (NIH): 132 mg/dL — ABNORMAL HIGH (ref 0–99)
LDL/HDL Ratio: 2.4 ratio (ref 0.0–3.2)
Triglycerides: 109 mg/dL (ref 0–149)
VLDL Cholesterol Cal: 19 mg/dL (ref 5–40)

## 2022-10-02 LAB — INSULIN, RANDOM: INSULIN: 14.1 u[IU]/mL (ref 2.6–24.9)

## 2022-10-02 LAB — VITAMIN D 25 HYDROXY (VIT D DEFICIENCY, FRACTURES): Vit D, 25-Hydroxy: 50.2 ng/mL (ref 30.0–100.0)

## 2022-10-03 NOTE — Anesthesia Preprocedure Evaluation (Signed)
Anesthesia Evaluation  Patient identified by MRN, date of birth, ID band Patient awake    Reviewed: Allergy & Precautions, NPO status , Patient's Chart, lab work & pertinent test results  History of Anesthesia Complications History of anesthetic complications: woke up during.  Airway        Dental   Pulmonary sleep apnea           Cardiovascular hypertension, Pt. on home beta blockers   HLD   Neuro/Psych  PSYCHIATRIC DISORDERS (PTSD) Anxiety Depression       GI/Hepatic Bowel prep,GERD  ,,Celiac disease   Endo/Other  diabetes, Type 2, Oral Hypoglycemic Agents  Morbid obesity  Renal/GU      Musculoskeletal  (+) Arthritis ,    Abdominal   Peds  Hematology  (+) Blood dyscrasia, anemia   Anesthesia Other Findings   Reproductive/Obstetrics                              Anesthesia Physical Anesthesia Plan  ASA: 3  Anesthesia Plan: MAC   Post-op Pain Management: Minimal or no pain anticipated   Induction:   PONV Risk Score and Plan: 2 and Propofol infusion  Airway Management Planned: Natural Airway and Nasal Cannula  Additional Equipment:   Intra-op Plan:   Post-operative Plan:   Informed Consent:   Plan Discussed with: CRNA and Anesthesiologist  Anesthesia Plan Comments: (Discussed with patient risks of MAC including, but not limited to, minor pain or discomfort, hearing people in the room, and possible need for backup general anesthesia. Risks for general anesthesia also discussed including, but not limited to, sore throat, hoarse voice, chipped/damaged teeth, injury to vocal cords, nausea and vomiting, allergic reactions, lung infection, heart attack, stroke, and death. All questions answered. )         Anesthesia Quick Evaluation

## 2022-10-04 ENCOUNTER — Ambulatory Visit (HOSPITAL_COMMUNITY): Payer: 59 | Admitting: Anesthesiology

## 2022-10-04 ENCOUNTER — Ambulatory Visit (HOSPITAL_BASED_OUTPATIENT_CLINIC_OR_DEPARTMENT_OTHER): Payer: 59 | Admitting: Anesthesiology

## 2022-10-04 ENCOUNTER — Encounter (HOSPITAL_COMMUNITY): Payer: Self-pay | Admitting: Gastroenterology

## 2022-10-04 ENCOUNTER — Other Ambulatory Visit: Payer: Self-pay

## 2022-10-04 ENCOUNTER — Encounter (HOSPITAL_COMMUNITY)
Admission: RE | Disposition: A | Discharge status: Home / Self Care | Payer: Self-pay | Source: Home / Self Care | Attending: Gastroenterology

## 2022-10-04 ENCOUNTER — Ambulatory Visit (HOSPITAL_COMMUNITY)
Admission: RE | Admit: 2022-10-04 | Discharge: 2022-10-04 | Disposition: A | Discharge status: Home / Self Care | Payer: 59 | Attending: Gastroenterology | Admitting: Gastroenterology

## 2022-10-04 DIAGNOSIS — D125 Benign neoplasm of sigmoid colon: Secondary | ICD-10-CM

## 2022-10-04 DIAGNOSIS — D120 Benign neoplasm of cecum: Secondary | ICD-10-CM

## 2022-10-04 DIAGNOSIS — K635 Polyp of colon: Secondary | ICD-10-CM | POA: Insufficient documentation

## 2022-10-04 DIAGNOSIS — G4730 Sleep apnea, unspecified: Secondary | ICD-10-CM | POA: Diagnosis not present

## 2022-10-04 DIAGNOSIS — Z7984 Long term (current) use of oral hypoglycemic drugs: Secondary | ICD-10-CM | POA: Diagnosis not present

## 2022-10-04 DIAGNOSIS — D649 Anemia, unspecified: Secondary | ICD-10-CM | POA: Diagnosis not present

## 2022-10-04 DIAGNOSIS — Z1211 Encounter for screening for malignant neoplasm of colon: Secondary | ICD-10-CM | POA: Diagnosis present

## 2022-10-04 DIAGNOSIS — K6289 Other specified diseases of anus and rectum: Secondary | ICD-10-CM | POA: Diagnosis not present

## 2022-10-04 DIAGNOSIS — K648 Other hemorrhoids: Secondary | ICD-10-CM | POA: Diagnosis not present

## 2022-10-04 DIAGNOSIS — D122 Benign neoplasm of ascending colon: Secondary | ICD-10-CM

## 2022-10-04 DIAGNOSIS — I10 Essential (primary) hypertension: Secondary | ICD-10-CM | POA: Diagnosis not present

## 2022-10-04 DIAGNOSIS — E119 Type 2 diabetes mellitus without complications: Secondary | ICD-10-CM | POA: Insufficient documentation

## 2022-10-04 DIAGNOSIS — F418 Other specified anxiety disorders: Secondary | ICD-10-CM

## 2022-10-04 DIAGNOSIS — K5730 Diverticulosis of large intestine without perforation or abscess without bleeding: Secondary | ICD-10-CM | POA: Insufficient documentation

## 2022-10-04 DIAGNOSIS — R748 Abnormal levels of other serum enzymes: Secondary | ICD-10-CM

## 2022-10-04 DIAGNOSIS — Z6841 Body Mass Index (BMI) 40.0 and over, adult: Secondary | ICD-10-CM | POA: Insufficient documentation

## 2022-10-04 DIAGNOSIS — D759 Disease of blood and blood-forming organs, unspecified: Secondary | ICD-10-CM | POA: Insufficient documentation

## 2022-10-04 DIAGNOSIS — K900 Celiac disease: Secondary | ICD-10-CM

## 2022-10-04 HISTORY — PX: POLYPECTOMY: SHX5525

## 2022-10-04 HISTORY — DX: Other complications of anesthesia, initial encounter: T88.59XA

## 2022-10-04 HISTORY — DX: Benign neoplasm of ascending colon: D12.2

## 2022-10-04 HISTORY — PX: COLONOSCOPY WITH PROPOFOL: SHX5780

## 2022-10-04 HISTORY — DX: Benign neoplasm of sigmoid colon: D12.5

## 2022-10-04 LAB — GLUCOSE, CAPILLARY: Glucose-Capillary: 108 mg/dL — ABNORMAL HIGH (ref 70–99)

## 2022-10-04 SURGERY — COLONOSCOPY WITH PROPOFOL
Anesthesia: Monitor Anesthesia Care

## 2022-10-04 MED ORDER — PROPOFOL 10 MG/ML IV BOLUS
INTRAVENOUS | Status: DC | PRN
  Administered 2022-10-04: 20 mg via INTRAVENOUS

## 2022-10-04 MED ORDER — LACTATED RINGERS IV SOLN: INTRAVENOUS | Status: DC | PRN

## 2022-10-04 MED ORDER — PROPOFOL 500 MG/50ML IV EMUL
INTRAVENOUS | Status: DC | PRN
  Administered 2022-10-04: 125 ug/kg/min via INTRAVENOUS

## 2022-10-04 MED ORDER — DEXMEDETOMIDINE HCL IN NACL 80 MCG/20ML IV SOLN
INTRAVENOUS | Status: DC | PRN
  Administered 2022-10-04 (×2): 4 ug via BUCCAL

## 2022-10-04 MED ORDER — EPHEDRINE SULFATE (PRESSORS) 50 MG/ML IJ SOLN
INTRAMUSCULAR | Status: DC | PRN
  Administered 2022-10-04 (×2): 10 mg via INTRAVENOUS

## 2022-10-04 MED ORDER — DEXMEDETOMIDINE HCL IN NACL 80 MCG/20ML IV SOLN
INTRAVENOUS | Status: AC
  Filled 2022-10-04: qty 20

## 2022-10-04 MED ORDER — ONDANSETRON HCL 4 MG/2ML IJ SOLN
INTRAMUSCULAR | Status: DC | PRN
  Administered 2022-10-04: 4 mg via INTRAVENOUS

## 2022-10-04 MED ORDER — SODIUM CHLORIDE 0.9 % IV SOLN: INTRAVENOUS | Status: DC

## 2022-10-04 SURGICAL SUPPLY — 22 items

## 2022-10-04 NOTE — Op Note (Signed)
[facility] Patient Name: Kelly Perez Procedure Date: 10/04/2022 MRN: [id number] Attending MD: Thornton Park MD, MD, [id number] Date of Birth: 08-31-1966 CSN: [id number] Age: 56 Admit Type: Ambulatory Procedure:                Colonoscopy Indications:              Screening for colorectal malignant neoplasm                           Colonoscopy 2013 in [place] (results not available)                           No known family history of colon cancer or polyps Providers:                Thornton Park MD, MD, Glori Bickers, RN Referring MD:             Thornton Park MD, MD Medicines:                Monitored Anesthesia Care Complications:            No immediate complications. Estimated Blood Loss:     Estimated blood loss was minimal. Procedure:                Pre-Anesthesia Assessment:                           - Prior to the procedure, a History and Physical                            was performed, and patient medications and                            allergies were reviewed. The patient's tolerance of                            previous anesthesia was also reviewed. The risks                            and benefits of the procedure and the sedation                            options and risks were discussed with the patient.                            All questions were answered, and informed consent                            was obtained. Prior Anticoagulants: The patient has                            taken no anticoagulant or antiplatelet agents. ASA                            Grade Assessment: III - A patient with severe  systemic disease. After reviewing the risks and                            benefits, the patient was deemed in satisfactory                            condition to undergo the procedure.                           After obtaining informed consent, the colonoscope                            was passed under  direct vision. Throughout the                            procedure, the patient's blood pressure, pulse, and                            oxygen saturations were monitored continuously. The                            CF-HQ190L ([id number]) Olympus colonoscope was                            introduced through the anus and advanced to the 3                            cm into the ileum. The colonoscopy was performed                            without difficulty. The patient tolerated the                            procedure well. The quality of the bowel                            preparation was good. The ileocecal valve,                            appendiceal orifice, and rectum were photographed. Scope In: 8:58:38 AM Scope Out: 9:13:04 AM Scope Withdrawal Time: 0 hours 8 minutes 5 seconds  Total Procedure Duration: 0 hours 14 minutes 26 seconds  Findings:      The perianal and digital rectal examinations were normal.      Non-bleeding internal hemorrhoids were found.      Anal papilla(e) were hypertrophied.      A 3 mm polyp was found in the sigmoid colon. The polyp was sessile. The       polyp was removed with a cold snare. Resection and retrieval were       complete. Estimated blood loss was minimal.      A few medium-mouthed and small-mouthed diverticula were found in the       sigmoid colon.      A less than 1 mm polyp was found in the cecum. The polyp was flat. The  polyp was removed with a cold biopsy forceps. Resection and retrieval       were complete. Estimated blood loss was minimal.      The exam was otherwise without abnormality on direct and retroflexion       views. Impression:               - Non-bleeding internal hemorrhoids.                           - Anal papilla(e) were hypertrophied.                           - One 3 mm polyp in the sigmoid colon, removed with                            a cold snare. Resected and retrieved.                           - One less  than 1 mm polyp in the cecum, removed                            with a cold biopsy forceps. Resected and retrieved.                           - The examination was otherwise normal on direct                            and retroflexion views. Moderate Sedation:      Not Applicable - Patient had care per Anesthesia. Recommendation:           - Patient has a contact number available for                            emergencies. The signs and symptoms of potential                            delayed complications were discussed with the                            patient. Return to normal activities tomorrow.                            Written discharge instructions were provided to the                            patient.                           - High fiber diet.                           - Continue present medications.                           - Await pathology results.                           -  Repeat colonoscopy date to be determined after                            pending pathology results are reviewed for                            surveillance.                           - Emerging evidence supports eating a diet of                            fruits, vegetables, grains, calcium, and yogurt                            while reducing red meat and alcohol may reduce the                            risk of colon cancer.                           - Thank you for allowing me to be involved in your                            colon cancer prevention. Procedure Code(s):        --- Professional ---                           [phone], Colonoscopy, flexible; with removal of                            tumor(s), polyp(s), or other lesion(s) by snare                            technique                           45380, 56, Colonoscopy, flexible; with biopsy,                            single or multiple Diagnosis Code(s):        --- Professional ---                           Z12.11, Encounter for  screening for malignant                            neoplasm of colon                           K62.89, Other specified diseases of anus and rectum                           D12.5, Benign neoplasm of sigmoid colon  D12.0, Benign neoplasm of cecum                           K64.8, Other hemorrhoids CPT copyright 2022 American [employer] Association. All rights reserved. The codes documented in this report are preliminary and upon coder review may  be revised to meet current compliance requirements. Thornton Park MD, MD 10/04/2022 9:18:49 AM This report has been signed electronically. Number of Addenda: 0

## 2022-10-04 NOTE — Anesthesia Postprocedure Evaluation (Signed)
Anesthesia Post Note  Patient: Kelly Perez  Procedure(s) Performed: COLONOSCOPY WITH PROPOFOL POLYPECTOMY     Patient location during evaluation: PACU Anesthesia Type: MAC Level of consciousness: awake Pain management: pain level controlled Vital Signs Assessment: post-procedure vital signs reviewed and stable Respiratory status: spontaneous breathing, nonlabored ventilation and respiratory function stable Cardiovascular status: stable and blood pressure returned to baseline Postop Assessment: no apparent nausea or vomiting Anesthetic complications: no   No notable events documented.  Last Vitals:  Vitals:   10/04/22 0918 10/04/22 0926  BP: (!) 100/36 (!) 120/46  Pulse: 77 75  Resp: (!) 26 20  Temp: 36.6 C   SpO2: 100% 97%    Last Pain:  Vitals:   10/04/22 0926  TempSrc:   PainSc: (P) 0-No pain                 Nilda Simmer

## 2022-10-04 NOTE — H&P (Signed)
Referring Provider: Thornton Park, MD Primary Care Physician:  Ma Hillock, DO  Indication for Procedure:  Colon cancer Surveillance   IMPRESSION:  Need for colon cancer surveillance  PLAN: Colonoscopy   HPI: Kelly Perez is a 55 y.o. female presents for surveillance colonoscopy.  Prior endoscopy: - EGD 06/21/12: grade 1 esophagitis, small hiatal hernia, gastroduodenitis, duodenal biopsies showed increased intraepithelial lymphocytes without villous blunting [name] classification 1); gastric biopsies were normal - Colonoscopy 06/21/2012 [referenced in documents from [place] but neither the procedure note nor the pathology findings were included]  No known family history of colon cancer or polyps. No family history of uterine/endometrial cancer, pancreatic cancer or gastric/stomach cancer.   Past Medical History:  Diagnosis Date   Allergy    Anemia    Anxiety    Arthritis    Bilateral primary osteoarthritis of knee 01/26/2018   Celiac disease 01/26/2018   Chronic depression 42/68/3419   Complication of anesthesia    woke up during a few times   Constipation    Food allergy    Gallbladder problem    GERD (gastroesophageal reflux disease)    Hypertension    Morbid obesity ([place]) 01/26/2018   Osteoarthritis    PTSD (post-traumatic stress disorder)    Sleep apnea    cpap   Vitamin B12 deficiency    due to celiac   Vitamin D deficiency     Past Surgical History:  Procedure Laterality Date   CHOLECYSTECTOMY  2015   SINUS IRRIGATION      Current Facility-Administered Medications  Medication Dose Route Frequency Provider Last Rate Last Admin   0.9 %  sodium chloride infusion   Intravenous Continuous Thornton Park, MD        Allergies as of 08/04/2022 - Review Complete 08/04/2022  Allergen Reaction Noted   Neosporin [neomycin-bacitracin zn-polymyx] Rash 01/26/2018   Tetracycline Anaphylaxis 12/01/2017   Neomycin  06/22/2018   Other  12/01/2017     Family History  Problem Relation Age of Onset   Bipolar disorder Mother    Diabetes Mother    Hypertension Mother    Heart disease Mother    Depression Mother    Obesity Mother    COPD Father    Depression Father    Early death Father    Heart disease Father    Kidney disease Father    Cardiomyopathy Father    Arthritis Father    Hypertension Father    Bipolar disorder Sister    Diabetes Brother    Heart disease Brother    Diabetes Maternal Grandmother    Bipolar disorder Maternal Grandfather    Diabetes Paternal Grandmother    Diabetes Paternal Grandfather    Hypertension Daughter    Depression Daughter    Diabetes Daughter    Rectal cancer Maternal Aunt    Stomach cancer Neg Hx    Liver disease Neg Hx    Colon cancer Neg Hx    Pancreatic cancer Neg Hx    Esophageal cancer Neg Hx      Physical Exam: General:   Alert,  well-nourished, pleasant and cooperative in NAD Head:  Normocephalic and atraumatic. Eyes:  Sclera clear, no icterus.   Conjunctiva pink. Mouth:  No deformity or lesions.   Neck:  Supple; no masses or thyromegaly. Lungs:  Clear throughout to auscultation.   No wheezes. Heart:  Regular rate and rhythm; no murmurs. Abdomen:  Soft, non-tender, nondistended, normal bowel sounds, no rebound or guarding.  Msk:  Symmetrical.  No boney deformities LAD: No inguinal or umbilical LAD Extremities:  No clubbing or edema. Neurologic:  Alert and  oriented x4;  grossly nonfocal Skin:  No obvious rash or bruise. Psych:  Alert and cooperative. Normal mood and affect.     Studies/Results: No results found.    Platon Arocho L. Tarri Glenn, MD, MPH 10/04/2022, 8:28 AM

## 2022-10-04 NOTE — Discharge Instructions (Signed)
YOU HAD AN ENDOSCOPIC PROCEDURE TODAY: Refer to the procedure report and other information in the discharge instructions given to you for any specific questions about what was found during the examination. If this information does not answer your questions, please call [place] office at [phone] to clarify.  ° °YOU SHOULD EXPECT: Some feelings of bloating in the abdomen. Passage of more gas than usual. Walking can help get rid of the air that was put into your GI tract during the procedure and reduce the bloating. If you had a lower endoscopy (such as a colonoscopy or flexible sigmoidoscopy) you may notice spotting of blood in your stool or on the toilet paper. Some abdominal soreness may be present for a day or two, also. ° °DIET: Your first meal following the procedure should be a light meal and then it is ok to progress to your normal diet. A half-sandwich or bowl of soup is an example of a good first meal. Heavy or fried foods are harder to digest and may make you feel nauseous or bloated. Drink plenty of fluids but you should avoid alcoholic beverages for 24 hours. If you had a esophageal dilation, please see attached instructions for diet.   ° °ACTIVITY: Your care partner should take you home directly after the procedure. You should plan to take it easy, moving slowly for the rest of the day. You can resume normal activity the day after the procedure however YOU SHOULD NOT DRIVE, use power tools, machinery or perform tasks that involve climbing or major physical exertion for 24 hours (because of the sedation medicines used during the test).  ° °SYMPTOMS TO REPORT IMMEDIATELY: °A gastroenterologist can be reached at any hour. Please call [phone]  for any of the following symptoms:  °Following lower endoscopy (colonoscopy, flexible sigmoidoscopy) °Excessive amounts of blood in the stool  °Significant tenderness, worsening of abdominal pains  °Swelling of the abdomen that is new, acute  °Fever of 100° or  higher  °Following upper endoscopy (EGD, EUS, ERCP, esophageal dilation) °Vomiting of blood or coffee ground material  °New, significant abdominal pain  °New, significant chest pain or pain under the shoulder blades  °Painful or persistently difficult swallowing  °New shortness of breath  °Black, tarry-looking or red, bloody stools ° °FOLLOW UP:  °If any biopsies were taken you will be contacted by phone or by letter within the next 1-3 weeks. Call [phone]  if you have not heard about the biopsies in 3 weeks.  °Please also call with any specific questions about appointments or follow up tests. ° °

## 2022-10-04 NOTE — Transfer of Care (Signed)
Immediate Anesthesia Transfer of Care Note  Patient: Kelly Perez  Procedure(s) Performed: Procedure(s): COLONOSCOPY WITH PROPOFOL (N/A)  Patient Location: Endoscopy Unit  Anesthesia Type:MAC  Level of Consciousness:  sedated, patient cooperative and responds to stimulation  Airway & Oxygen Therapy:Patient Spontanous Breathing and Patient connected to face mask oxgen  Post-op Assessment:  Report given to PACU RN and Post -op Vital signs reviewed and stable  Post vital signs:  Reviewed and stable  Last Vitals:  Vitals:   10/04/22 0828  BP: (!) 188/77  Pulse: 72  Resp: 16  Temp: 37 C  SpO2: 69%    Complications: No apparent anesthesia complications

## 2022-10-06 ENCOUNTER — Encounter: Payer: Self-pay | Admitting: Gastroenterology

## 2022-10-06 LAB — SURGICAL PATHOLOGY

## 2022-10-07 ENCOUNTER — Encounter (HOSPITAL_COMMUNITY): Payer: Self-pay | Admitting: Gastroenterology

## 2022-10-11 NOTE — Progress Notes (Unsigned)
Chief Complaint:   OBESITY Kelly Perez is here to discuss her progress with her obesity treatment plan along with follow-up of her obesity related diagnoses. Kelly Perez is on following a lower carbohydrate, vegetable and lean protein rich diet plan and states she is following her eating plan approximately 75-80% of the time. Kelly Perez states she is walking for 30 minutes 7 times per week.  Today's visit was #: 30 Starting weight: 372 lbs Starting date: 06/17/2020 Today's weight: 315 lbs Today's date: 09/30/2022 Total lbs lost to date: 60 Total lbs lost since last in-office visit: 3  Interim History: Kelly Perez has done well with avoiding holiday weight gain.  She has had increased stress at work, but she is doing well with decreasing emotional eating behaviors.  Subjective:   1. Vitamin D deficiency Christian is on vitamin D, and she is due to have her labs rechecked.  2. Type 2 diabetes mellitus with other specified complication, without long-term current use of insulin (HCC) Kelly Perez is on metformin, and she is doing well with her diet.  3. Essential hypertension Kelly Perez has been working on stress reduction techniques.  Her blood pressure has been better this last week.  Assessment/Plan:   1. Vitamin D deficiency We will check labs today, and we will refill prescription vitamin D 50,000 IU every week for 1 month.  - VITAMIN D 25 Hydroxy (Vit-D Deficiency, Fractures) - Vitamin D, Ergocalciferol, (DRISDOL) 1.25 MG (50000 UNIT) CAPS capsule; Take 1 capsule (50,000 Units total) by mouth every 7 (seven) days.  Dispense: 4 capsule; Refill: 0  2. Type 2 diabetes mellitus with other specified complication, without long-term current use of insulin (HCC) We will check labs today.  Rylynn will continue metformin 500 mg once daily, and we will refill for 1 month.  - CMP14+EGFR - Insulin, random - Hemoglobin A1c - Lipid Panel With LDL/HDL Ratio - metFORMIN (GLUCOPHAGE) 500 MG tablet; Take 1 tablet (500 mg  total) by mouth daily after lunch.  Dispense: 30 tablet; Refill: 0  3. Essential hypertension Kelly Perez will continue with her stress reduction techniques, and we will continue to monitor.  4. Obesity, Current BMI 49.3 Kelly Perez is currently in the action stage of change. As such, her goal is to continue with weight loss efforts. She has agreed to following a lower carbohydrate, vegetable and lean protein rich diet plan.   Exercise goals: As is.   Behavioral modification strategies: increasing lean protein intake and emotional eating strategies.  Berdena has agreed to follow-up with our clinic in 4 weeks. She was informed of the importance of frequent follow-up visits to maximize her success with intensive lifestyle modifications for her multiple health conditions.   Kelly Perez was informed we would discuss her lab results at her next visit unless there is a critical issue that needs to be addressed sooner. Kelly Perez agreed to keep her next visit at the agreed upon time to discuss these results.  Objective:   Blood pressure 123/76, pulse (!) 59, temperature 98.4 F (36.9 C), height _0  (1.702 m), weight (!) 315 lb (142.9 kg), last menstrual period 02/13/2018, SpO2 97 %. Body mass index is 49.34 kg/m.  General: Cooperative, alert, well developed, in no acute distress. HEENT: Conjunctivae and lids unremarkable. Cardiovascular: Regular rhythm.  Lungs: Normal work of breathing. Neurologic: No focal deficits.   Lab Results  Component Value Date   CREATININE 0.64 09/30/2022   BUN 19 09/30/2022   NA 140 09/30/2022   K 4.9 09/30/2022   CL  101 09/30/2022   CO2 22 09/30/2022   Lab Results  Component Value Date   ALT 23 09/30/2022   AST 15 09/30/2022   ALKPHOS 107 09/30/2022   BILITOT 0.4 09/30/2022   Lab Results  Component Value Date   HGBA1C 5.9 (H) 09/30/2022   HGBA1C 5.9 (H) 05/04/2022   HGBA1C 6.4 01/06/2022   HGBA1C 6.2 (H) 09/08/2021   HGBA1C 6.2 (H) 06/11/2021   Lab Results   Component Value Date   INSULIN 14.1 09/30/2022   INSULIN 10.5 05/04/2022   INSULIN 12.4 09/08/2021   INSULIN 14.9 06/11/2021   INSULIN 13.9 02/05/2021   Lab Results  Component Value Date   TSH 2.340 05/04/2022   Lab Results  Component Value Date   CHOL 206 (H) 09/30/2022   HDL 55 09/30/2022   LDLCALC 132 (H) 09/30/2022   TRIG 109 09/30/2022   CHOLHDL 4 06/22/2018   Lab Results  Component Value Date   VD25OH 50.2 09/30/2022   VD25OH 58.8 05/04/2022   VD25OH 56.0 02/04/2022   Lab Results  Component Value Date   WBC 7.8 05/04/2022   HGB 14.0 05/04/2022   HCT 43.9 05/04/2022   MCV 86 05/04/2022   PLT 437 05/04/2022   Lab Results  Component Value Date   IRON 82 01/26/2018   TIBC 391 01/26/2018   FERRITIN 35 01/26/2018   Attestation Statements:   Reviewed by clinician on day of visit: allergies, medications, problem list, medical history, surgical history, family history, social history, and previous encounter notes.   I, Trixie Dredge, am acting as transcriptionist for Dennard Nip, MD.  I have reviewed the above documentation for accuracy and completeness, and I agree with the above. -  Dennard Nip, MD

## 2022-10-28 ENCOUNTER — Encounter (INDEPENDENT_AMBULATORY_CARE_PROVIDER_SITE_OTHER): Payer: Self-pay | Admitting: Family Medicine

## 2022-10-28 ENCOUNTER — Ambulatory Visit (INDEPENDENT_AMBULATORY_CARE_PROVIDER_SITE_OTHER): Payer: 59 | Admitting: Family Medicine

## 2022-10-28 VITALS — BP 117/78 | HR 61 | Temp 98.0°F | Ht 67.0 in | Wt 319.0 lb

## 2022-10-28 DIAGNOSIS — Z7984 Long term (current) use of oral hypoglycemic drugs: Secondary | ICD-10-CM

## 2022-10-28 DIAGNOSIS — Z7985 Long-term (current) use of injectable non-insulin antidiabetic drugs: Secondary | ICD-10-CM

## 2022-10-28 DIAGNOSIS — E1169 Type 2 diabetes mellitus with other specified complication: Secondary | ICD-10-CM

## 2022-10-28 DIAGNOSIS — E669 Obesity, unspecified: Secondary | ICD-10-CM | POA: Diagnosis not present

## 2022-10-28 DIAGNOSIS — E559 Vitamin D deficiency, unspecified: Secondary | ICD-10-CM

## 2022-10-28 DIAGNOSIS — Z6841 Body Mass Index (BMI) 40.0 and over, adult: Secondary | ICD-10-CM | POA: Diagnosis not present

## 2022-10-28 MED ORDER — RYBELSUS 3 MG PO TABS: 3.0000 mg | ORAL_TABLET | Freq: Every day | ORAL | 0 refills | Status: DC

## 2022-10-28 MED ORDER — VITAMIN D (ERGOCALCIFEROL) 1.25 MG (50000 UNIT) PO CAPS: 50000.0000 [IU] | ORAL_CAPSULE | ORAL | 0 refills | Status: DC

## 2022-10-28 MED ORDER — METFORMIN HCL 500 MG PO TABS: 500.0000 mg | ORAL_TABLET | Freq: Every day | ORAL | 0 refills | Status: DC

## 2022-11-01 ENCOUNTER — Telehealth (INDEPENDENT_AMBULATORY_CARE_PROVIDER_SITE_OTHER): Payer: Self-pay | Admitting: Family Medicine

## 2022-11-01 NOTE — Telephone Encounter (Signed)
Information regarding your request This request has been approved using information available on the patient's profile. [id number];Review Type:Prior Auth;Coverage Start Date:10/02/2022;Coverage End Date:11/01/2023;

## 2022-11-09 NOTE — Progress Notes (Signed)
Chief Complaint:   OBESITY Kelly Perez is here to discuss her progress with her obesity treatment plan along with follow-up of her obesity related diagnoses. Kelly Perez is on following a lower carbohydrate, vegetable and lean protein rich diet plan and states she is following her eating plan approximately 75% of the time. Kelly Perez states she is walking and doing chair yoga for 30 minutes 7 times per week.  Today's visit was #: 28 Starting weight: 372 lbs Starting date: 06/17/2020 Today's weight: 319 lbs Today's date: 10/28/2022 Total lbs lost to date: 62 Total lbs lost since last in-office visit: 0  Interim History: Kelly Perez did some celebration eating over the holidays and she is getting back on track with her eating plan and exercise.  Subjective:   1. Type 2 diabetes mellitus with other specified complication, without long-term current use of insulin (HCC) June is doing well with her diet and medications.  Her last A1c was at goal but she struggles with polyphagia still.  2. Vitamin D deficiency Kelly Perez is on vitamin D prescription, with no side effects noted.  She requests a refill today.  Assessment/Plan:   1. Type 2 diabetes mellitus with other specified complication, without long-term current use of insulin ([place]) Lima agreed to start Rybelsus 3 mg once daily with no refills; and we will refill metformin for 1 month.  - metFORMIN (GLUCOPHAGE) 500 MG tablet; Take 1 tablet (500 mg total) by mouth daily after lunch.  Dispense: 30 tablet; Refill: 0 - Semaglutide (RYBELSUS) 3 MG TABS; Take 3 mg by mouth daily.  Dispense: 30 tablet; Refill: 0  2. Vitamin D deficiency Kelly Perez will continue prescription vitamin D, and we will refill for 1 month.  - Vitamin D, Ergocalciferol, (DRISDOL) 1.25 MG (50000 UNIT) CAPS capsule; Take 1 capsule (50,000 Units total) by mouth every 7 (seven) days.  Dispense: 4 capsule; Refill: 0  3. Obesity, Current BMI 51.6 Kelly Perez is currently in the action stage of change.  As such, her goal is to continue with weight loss efforts. She has agreed to following a lower carbohydrate, vegetable and lean protein rich diet plan.   Exercise goals: As is.   Behavioral modification strategies: increasing lean protein intake.  Aliscia has agreed to follow-up with our clinic in 4 weeks. She was informed of the importance of frequent follow-up visits to maximize her success with intensive lifestyle modifications for her multiple health conditions.   Objective:   Blood pressure 117/78, pulse 61, temperature 98 F (36.7 C), height '5\' 7"'$  (1.702 m), weight (!) 319 lb (144.7 kg), last menstrual period 02/13/2018, SpO2 96 %. Body mass index is 49.96 kg/m.  General: Cooperative, alert, well developed, in no acute distress. HEENT: Conjunctivae and lids unremarkable. Cardiovascular: Regular rhythm.  Lungs: Normal work of breathing. Neurologic: No focal deficits.   Lab Results  Component Value Date   CREATININE 0.64 09/30/2022   BUN 19 09/30/2022   NA 140 09/30/2022   K 4.9 09/30/2022   CL 101 09/30/2022   CO2 22 09/30/2022   Lab Results  Component Value Date   ALT 23 09/30/2022   AST 15 09/30/2022   ALKPHOS 107 09/30/2022   BILITOT 0.4 09/30/2022   Lab Results  Component Value Date   HGBA1C 5.9 (H) 09/30/2022   HGBA1C 5.9 (H) 05/04/2022   HGBA1C 6.4 01/06/2022   HGBA1C 6.2 (H) 09/08/2021   HGBA1C 6.2 (H) 06/11/2021   Lab Results  Component Value Date   INSULIN 14.1 09/30/2022  INSULIN 10.5 05/04/2022   INSULIN 12.4 09/08/2021   INSULIN 14.9 06/11/2021   INSULIN 13.9 02/05/2021   Lab Results  Component Value Date   TSH 2.340 05/04/2022   Lab Results  Component Value Date   CHOL 206 (H) 09/30/2022   HDL 55 09/30/2022   LDLCALC 132 (H) 09/30/2022   TRIG 109 09/30/2022   CHOLHDL 4 06/22/2018   Lab Results  Component Value Date   VD25OH 50.2 09/30/2022   VD25OH 58.8 05/04/2022   VD25OH 56.0 02/04/2022   Lab Results  Component Value Date    WBC 7.8 05/04/2022   HGB 14.0 05/04/2022   HCT 43.9 05/04/2022   MCV 86 05/04/2022   PLT 437 05/04/2022   Lab Results  Component Value Date   IRON 82 01/26/2018   TIBC 391 01/26/2018   FERRITIN 35 01/26/2018   Attestation Statements:   Reviewed by clinician on day of visit: allergies, medications, problem list, medical history, surgical history, family history, social history, and previous encounter notes.   I, Trixie Dredge, am acting as transcriptionist for Dennard Nip, MD.  I have reviewed the above documentation for accuracy and completeness, and I agree with the above. -  Dennard Nip, MD

## 2022-11-18 ENCOUNTER — Other Ambulatory Visit (INDEPENDENT_AMBULATORY_CARE_PROVIDER_SITE_OTHER): Payer: Self-pay | Admitting: Family Medicine

## 2022-11-18 DIAGNOSIS — E1169 Type 2 diabetes mellitus with other specified complication: Secondary | ICD-10-CM

## 2022-11-25 ENCOUNTER — Ambulatory Visit (INDEPENDENT_AMBULATORY_CARE_PROVIDER_SITE_OTHER): Payer: 59 | Admitting: Family Medicine

## 2022-11-25 ENCOUNTER — Encounter (INDEPENDENT_AMBULATORY_CARE_PROVIDER_SITE_OTHER): Payer: Self-pay | Admitting: Family Medicine

## 2022-11-25 VITALS — BP 121/76 | HR 62 | Temp 98.1°F | Ht 67.0 in | Wt 320.0 lb

## 2022-11-25 DIAGNOSIS — E669 Obesity, unspecified: Secondary | ICD-10-CM

## 2022-11-25 DIAGNOSIS — Z7984 Long term (current) use of oral hypoglycemic drugs: Secondary | ICD-10-CM

## 2022-11-25 DIAGNOSIS — E1169 Type 2 diabetes mellitus with other specified complication: Secondary | ICD-10-CM | POA: Diagnosis not present

## 2022-11-25 DIAGNOSIS — E559 Vitamin D deficiency, unspecified: Secondary | ICD-10-CM

## 2022-11-25 DIAGNOSIS — Z6841 Body Mass Index (BMI) 40.0 and over, adult: Secondary | ICD-10-CM | POA: Diagnosis not present

## 2022-11-25 MED ORDER — RYBELSUS 3 MG PO TABS: 3.0000 mg | ORAL_TABLET | Freq: Every day | ORAL | 0 refills | Status: DC

## 2022-11-25 MED ORDER — VITAMIN D (ERGOCALCIFEROL) 1.25 MG (50000 UNIT) PO CAPS: 50000.0000 [IU] | ORAL_CAPSULE | ORAL | 0 refills | Status: DC

## 2022-12-07 NOTE — Progress Notes (Unsigned)
Chief Complaint:   OBESITY Kelly Perez is here to discuss her progress with her obesity treatment plan along with follow-up of her obesity related diagnoses. Kelly Perez is on following a lower carbohydrate, vegetable and lean protein rich diet plan and states she is following her eating plan approximately 85% of the time. Kelly Perez states she is walking and doing chair yoga for 15-25 minutes 7 times per week.  Today's visit was #: 73 Starting weight: 372 lbs Starting date: 06/17/2020 Today's weight: 320 lbs Today's date: 11/25/2022 Total lbs lost to date: 52 Total lbs lost since last in-office visit: 0  Interim History: Kelly Perez is retaining some water weight today. She is working on following a low carbohydrate plan. She is doing chair yoga using the "yoga go" up app and she is enjoying this.   Subjective:   1. Type 2 diabetes mellitus with other specified complication, without long-term current use of insulin ([place]) Reighn started Rybelsus in addition to metformin. She notes increased fatigue after lunch since starting.   2. Vitamin D deficiency Kelly Perez is stable on Vitamin D.   Assessment/Plan:   1. Type 2 diabetes mellitus with other specified complication, without long-term current use of insulin ([place]) Nahal agreed to discontinue metformin, and continue Rybelsus and we will refill for 1 month. We will follow-up in 4 weeks to see if she is ready to increase her dose.   - Semaglutide (RYBELSUS) 3 MG TABS; Take 1 tablet (3 mg total) by mouth daily.  Dispense: 30 tablet; Refill: 0  2. Vitamin D deficiency Kelly Perez will continue prescription Vitamin D, and we will refill for 1 month.   - Vitamin D, Ergocalciferol, (DRISDOL) 1.25 MG (50000 UNIT) CAPS capsule; Take 1 capsule (50,000 Units total) by mouth every 7 (seven) days.  Dispense: 4 capsule; Refill: 0  3. BMI 50.0-59.9, adult ([place])  4. Obesity, Beginning BMI 60.04 Kelly Perez is currently in the action stage of change. As such, her goal is to  continue with weight loss efforts. She has agreed to following a lower carbohydrate, vegetable and lean protein rich diet plan.   Exercise goals: As is.   Behavioral modification strategies: increasing lean protein intake.  Kelly Perez has agreed to follow-up with our clinic in 4 weeks. She was informed of the importance of frequent follow-up visits to maximize her success with intensive lifestyle modifications for her multiple health conditions.   Objective:   Blood pressure 121/76, pulse 62, temperature 98.1 F (36.7 C), height 5' 7"$  (1.702 m), weight (!) 320 lb (145.2 kg), last menstrual period 02/13/2018, SpO2 97 %. Body mass index is 50.12 kg/m.  General: Cooperative, alert, well developed, in no acute distress. HEENT: Conjunctivae and lids unremarkable. Cardiovascular: Regular rhythm.  Lungs: Normal work of breathing. Neurologic: No focal deficits.   Lab Results  Component Value Date   CREATININE 0.64 09/30/2022   BUN 19 09/30/2022   NA 140 09/30/2022   K 4.9 09/30/2022   CL 101 09/30/2022   CO2 22 09/30/2022   Lab Results  Component Value Date   ALT 23 09/30/2022   AST 15 09/30/2022   ALKPHOS 107 09/30/2022   BILITOT 0.4 09/30/2022   Lab Results  Component Value Date   HGBA1C 5.9 (H) 09/30/2022   HGBA1C 5.9 (H) 05/04/2022   HGBA1C 6.4 01/06/2022   HGBA1C 6.2 (H) 09/08/2021   HGBA1C 6.2 (H) 06/11/2021   Lab Results  Component Value Date   INSULIN 14.1 09/30/2022   INSULIN 10.5 05/04/2022  INSULIN 12.4 09/08/2021   INSULIN 14.9 06/11/2021   INSULIN 13.9 02/05/2021   Lab Results  Component Value Date   TSH 2.340 05/04/2022   Lab Results  Component Value Date   CHOL 206 (H) 09/30/2022   HDL 55 09/30/2022   LDLCALC 132 (H) 09/30/2022   TRIG 109 09/30/2022   CHOLHDL 4 06/22/2018   Lab Results  Component Value Date   VD25OH 50.2 09/30/2022   VD25OH 58.8 05/04/2022   VD25OH 56.0 02/04/2022   Lab Results  Component Value Date   WBC 7.8 05/04/2022    HGB 14.0 05/04/2022   HCT 43.9 05/04/2022   MCV 86 05/04/2022   PLT 437 05/04/2022   Lab Results  Component Value Date   IRON 82 01/26/2018   TIBC 391 01/26/2018   FERRITIN 35 01/26/2018   Attestation Statements:   Reviewed by clinician on day of visit: allergies, medications, problem list, medical history, surgical history, family history, social history, and previous encounter notes.   I, Trixie Dredge, am acting as transcriptionist for Dennard Nip, MD.  I have reviewed the above documentation for accuracy and completeness, and I agree with the above. -  Dennard Nip, MD

## 2022-12-23 ENCOUNTER — Encounter (INDEPENDENT_AMBULATORY_CARE_PROVIDER_SITE_OTHER): Payer: Self-pay | Admitting: Family Medicine

## 2022-12-23 ENCOUNTER — Ambulatory Visit (INDEPENDENT_AMBULATORY_CARE_PROVIDER_SITE_OTHER): Payer: 59 | Admitting: Family Medicine

## 2022-12-23 VITALS — BP 140/77 | HR 68 | Temp 97.9°F | Ht 67.0 in | Wt 316.0 lb

## 2022-12-23 DIAGNOSIS — E559 Vitamin D deficiency, unspecified: Secondary | ICD-10-CM

## 2022-12-23 DIAGNOSIS — E669 Obesity, unspecified: Secondary | ICD-10-CM | POA: Diagnosis not present

## 2022-12-23 DIAGNOSIS — R5383 Other fatigue: Secondary | ICD-10-CM | POA: Diagnosis not present

## 2022-12-23 DIAGNOSIS — Z6841 Body Mass Index (BMI) 40.0 and over, adult: Secondary | ICD-10-CM | POA: Insufficient documentation

## 2022-12-23 DIAGNOSIS — Z7984 Long term (current) use of oral hypoglycemic drugs: Secondary | ICD-10-CM

## 2022-12-23 DIAGNOSIS — E1169 Type 2 diabetes mellitus with other specified complication: Secondary | ICD-10-CM

## 2022-12-23 MED ORDER — VITAMIN D (ERGOCALCIFEROL) 1.25 MG (50000 UNIT) PO CAPS: 50000.0000 [IU] | ORAL_CAPSULE | ORAL | 0 refills | Status: DC

## 2022-12-23 MED ORDER — RYBELSUS 3 MG PO TABS: 3.0000 mg | ORAL_TABLET | Freq: Every day | ORAL | 0 refills | Status: DC

## 2023-01-06 ENCOUNTER — Encounter: Payer: Self-pay | Admitting: Gastroenterology

## 2023-01-07 ENCOUNTER — Encounter: Payer: 59 | Admitting: Family Medicine

## 2023-01-07 DIAGNOSIS — E559 Vitamin D deficiency, unspecified: Secondary | ICD-10-CM

## 2023-01-07 DIAGNOSIS — E1169 Type 2 diabetes mellitus with other specified complication: Secondary | ICD-10-CM

## 2023-01-10 NOTE — Progress Notes (Unsigned)
Chief Complaint:   OBESITY Kelly Perez is here to discuss her progress with her obesity treatment plan along with follow-up of her obesity related diagnoses. Kelly Perez is on following a lower carbohydrate, vegetable and lean protein rich diet plan and states she is following her eating plan approximately 75% of the time. Kelly Perez states she is walking for 15-20 minutes 7 times per week.  Today's visit was #: 40 Starting weight: 372 lbs Starting date: 06/17/2020 Today's weight: 316 lbs Today's date: 12/23/2022 Total lbs lost to date: 56 Total lbs lost since last in-office visit: 4  Interim History: Kelly Perez has done very well with weight loss. She is getting ready to travel to [place] for a convention and she is making strategies to help her eat better during the trip. Her hunger is controlled.   Subjective:   1. Other fatigue Kelly Perez noted metformin increased fatigue and so she stopped, and her fatigue has improved.   2. Type 2 diabetes mellitus with other specified complication, without long-term current use of insulin (Kelly Perez) Kelly Perez is on Rybelsus and she is doing well with decreasing polyphagia. She denies nausea of vomiting.   3. Vitamin D deficiency Kelly Perez is on Vitamin D with no side effects noted. Her recent Vitamin D level was at goal. She requests a refill today.   Assessment/Plan:   1. Other fatigue Kelly Perez will stop metformin, and we will continue to follow.  2. Type 2 diabetes mellitus with other specified complication, without long-term current use of insulin (Kelly Perez) Kelly Perez will continue with her diet, exercise, and Rybelsus. We will refill Rybelsus for 1 month.   - Semaglutide (RYBELSUS) 3 MG TABS; Take 1 tablet (3 mg total) by mouth daily.  Dispense: 30 tablet; Refill: 0  3. Vitamin D deficiency Kelly Perez will continue prescription Vitamin D, and we will refill for 1 month.   - Vitamin D, Ergocalciferol, (DRISDOL) 1.25 MG (50000 UNIT) CAPS capsule; Take 1 capsule (50,000 Units total) by  mouth every 7 (seven) days.  Dispense: 4 capsule; Refill: 0  4. BMI 45.0-49.9, adult (Kelly Perez)  5. Obesity, Beginning BMI 60.04 Kelly Perez is currently in the action stage of change. As such, her goal is to continue with weight loss efforts. She has agreed to following a lower carbohydrate, vegetable and lean protein rich diet plan.   Exercise goals: As is.   Behavioral modification strategies: increasing water intake and travel eating strategies.  Kelly Perez has agreed to follow-up with our clinic in 4 weeks. She was informed of the importance of frequent follow-up visits to maximize her success with intensive lifestyle modifications for her multiple health conditions.   Objective:   Blood pressure (!) 140/77, pulse 68, temperature 97.9 F (36.6 C), height 5\' 7"  (1.702 m), weight (!) 316 lb (143.3 kg), last menstrual period 02/13/2018, SpO2 95 %. Body mass index is 49.49 kg/m.  Lab Results  Component Value Date   CREATININE 0.64 09/30/2022   BUN 19 09/30/2022   NA 140 09/30/2022   K 4.9 09/30/2022   CL 101 09/30/2022   CO2 22 09/30/2022   Lab Results  Component Value Date   ALT 23 09/30/2022   AST 15 09/30/2022   ALKPHOS 107 09/30/2022   BILITOT 0.4 09/30/2022   Lab Results  Component Value Date   HGBA1C 5.9 (H) 09/30/2022   HGBA1C 5.9 (H) 05/04/2022   HGBA1C 6.4 01/06/2022   HGBA1C 6.2 (H) 09/08/2021   HGBA1C 6.2 (H) 06/11/2021   Lab Results  Component Value Date  INSULIN 14.1 09/30/2022   INSULIN 10.5 05/04/2022   INSULIN 12.4 09/08/2021   INSULIN 14.9 06/11/2021   INSULIN 13.9 02/05/2021   Lab Results  Component Value Date   TSH 2.340 05/04/2022   Lab Results  Component Value Date   CHOL 206 (H) 09/30/2022   HDL 55 09/30/2022   LDLCALC 132 (H) 09/30/2022   TRIG 109 09/30/2022   CHOLHDL 4 06/22/2018   Lab Results  Component Value Date   VD25OH 50.2 09/30/2022   VD25OH 58.8 05/04/2022   VD25OH 56.0 02/04/2022   Lab Results  Component Value Date   WBC 7.8  05/04/2022   HGB 14.0 05/04/2022   HCT 43.9 05/04/2022   MCV 86 05/04/2022   PLT 437 05/04/2022   Lab Results  Component Value Date   IRON 82 01/26/2018   TIBC 391 01/26/2018   FERRITIN 35 01/26/2018   Attestation Statements:   Reviewed by clinician on day of visit: allergies, medications, problem list, medical history, surgical history, family history, social history, and previous encounter notes.   I, Trixie Dredge, am acting as transcriptionist for Dennard Nip, MD.  I have reviewed the above documentation for accuracy and completeness, and I agree with the above. -  Dennard Nip, MD

## 2023-01-11 ENCOUNTER — Ambulatory Visit (INDEPENDENT_AMBULATORY_CARE_PROVIDER_SITE_OTHER): Payer: 59 | Admitting: Family Medicine

## 2023-01-11 ENCOUNTER — Encounter: Payer: Self-pay | Admitting: Family Medicine

## 2023-01-11 VITALS — BP 113/72 | HR 74 | Temp 98.4°F | Ht 65.75 in | Wt 315.8 lb

## 2023-01-11 DIAGNOSIS — E1169 Type 2 diabetes mellitus with other specified complication: Secondary | ICD-10-CM

## 2023-01-11 DIAGNOSIS — Z1231 Encounter for screening mammogram for malignant neoplasm of breast: Secondary | ICD-10-CM

## 2023-01-11 DIAGNOSIS — Z6841 Body Mass Index (BMI) 40.0 and over, adult: Secondary | ICD-10-CM

## 2023-01-11 DIAGNOSIS — E785 Hyperlipidemia, unspecified: Secondary | ICD-10-CM

## 2023-01-11 DIAGNOSIS — I10 Essential (primary) hypertension: Secondary | ICD-10-CM

## 2023-01-11 DIAGNOSIS — Z8249 Family history of ischemic heart disease and other diseases of the circulatory system: Secondary | ICD-10-CM | POA: Diagnosis not present

## 2023-01-11 DIAGNOSIS — E538 Deficiency of other specified B group vitamins: Secondary | ICD-10-CM

## 2023-01-11 DIAGNOSIS — Z0000 Encounter for general adult medical examination without abnormal findings: Secondary | ICD-10-CM | POA: Diagnosis not present

## 2023-01-11 DIAGNOSIS — D125 Benign neoplasm of sigmoid colon: Secondary | ICD-10-CM

## 2023-01-11 DIAGNOSIS — D122 Benign neoplasm of ascending colon: Secondary | ICD-10-CM

## 2023-01-11 DIAGNOSIS — E559 Vitamin D deficiency, unspecified: Secondary | ICD-10-CM

## 2023-01-11 LAB — CBC WITH DIFFERENTIAL/PLATELET
Basophils Absolute: 0.1 10*3/uL (ref 0.0–0.1)
Basophils Relative: 0.8 % (ref 0.0–3.0)
Eosinophils Absolute: 0.4 10*3/uL (ref 0.0–0.7)
Eosinophils Relative: 4.1 % (ref 0.0–5.0)
HCT: 43.2 % (ref 36.0–46.0)
Hemoglobin: 14.4 g/dL (ref 12.0–15.0)
Lymphocytes Relative: 23.6 % (ref 12.0–46.0)
Lymphs Abs: 2.1 10*3/uL (ref 0.7–4.0)
MCHC: 33.3 g/dL (ref 30.0–36.0)
MCV: 87.3 fl (ref 78.0–100.0)
Monocytes Absolute: 0.7 10*3/uL (ref 0.1–1.0)
Monocytes Relative: 8.5 % (ref 3.0–12.0)
Neutro Abs: 5.5 10*3/uL (ref 1.4–7.7)
Neutrophils Relative %: 63 % (ref 43.0–77.0)
Platelets: 429 10*3/uL — ABNORMAL HIGH (ref 150.0–400.0)
RBC: 4.95 Mil/uL (ref 3.87–5.11)
RDW: 14.2 % (ref 11.5–15.5)
WBC: 8.7 10*3/uL (ref 4.0–10.5)

## 2023-01-11 LAB — MICROALBUMIN / CREATININE URINE RATIO
Creatinine,U: 34.2 mg/dL
Microalb Creat Ratio: 2 mg/g (ref 0.0–30.0)
Microalb, Ur: <0.7 mg/dL (ref 0.0–1.9)

## 2023-01-11 LAB — LIPID PANEL
Cholesterol: 219 mg/dL — ABNORMAL HIGH (ref 0–200)
HDL: 47 mg/dL (ref >39.00)
LDL Cholesterol: 139 mg/dL — ABNORMAL HIGH (ref 0–99)
NonHDL: 171.8
Total CHOL/HDL Ratio: 5
Triglycerides: 162 mg/dL — ABNORMAL HIGH (ref 0.0–149.0)
VLDL: 32.4 mg/dL (ref 0.0–40.0)

## 2023-01-11 LAB — TSH: TSH: 2.05 u[IU]/mL (ref 0.35–5.50)

## 2023-01-11 LAB — HEMOGLOBIN A1C: Hgb A1c MFr Bld: 6 % (ref 4.6–6.5)

## 2023-01-11 MED ORDER — EZETIMIBE 10 MG PO TABS: 10.0000 mg | ORAL_TABLET | Freq: Every day | ORAL | 3 refills | Status: DC

## 2023-01-11 MED ORDER — BISOPROLOL FUMARATE 10 MG PO TABS: 10.0000 mg | ORAL_TABLET | Freq: Every day | ORAL | 1 refills | Status: DC

## 2023-01-11 NOTE — Progress Notes (Signed)
Patient ID: Kelly Perez, female  DOB: Apr 30, 1966, 57 y.o.   MRN: [id number] Patient Care Team    Relationship Specialty Notifications Start End  Ma Hillock, DO PCP - General Family Medicine  07/06/21   Thornton Park, MD Consulting Physician Gastroenterology  07/08/21   Marin Comment, My [place], [place] Referring Physician Optometry  07/08/21   Starlyn Skeans, MD Consulting Physician Bariatrics  07/08/21     Chief Complaint  Patient presents with   Annual Exam    [facility]; pt is not fasting    Subjective: Kelly Perez is a 57 y.o.  Female  present for CPE and Chronic Conditions/illness Management All past medical history, surgical history, allergies, family history, immunizations, medications and social history were updated in the electronic medical record today. All recent labs, ED visits and hospitalizations within the last year were reviewed.  Health maintenance:  Colonoscopy: completed 09/2022. Dr. Tarri Glenn - 75 yr f/u Mammogram: completed:[facility]/04/[facility]- [facility] > ordered placed  2024 Cervical cancer screening: last pap: 08/2022 WNL/cotest neg- 5 yr- Dr. Raoul Pitch Immunizations: tdap utd 2016, Influenza declined (encouraged yearly), shingrix declined, PNA20 UTD Infectious disease screening: HIV declined, Hep C completed DEXA: routine screen Patient has a Dental home. Hospitalizations/ED visits: reviewed  Hyperlipidemia associated with type 2 diabetes mellitus ([place]) Diabetes has been managed by her weight management team. She currently is prescribed metformin 500 mg daily.  Reports she has not been able to tolerate higher doses.  Essential hypertension/lipidemia/morbid obesity/family history of heart disease Pt reports compliance with bisoprolol 10 mg daily..  Patient denies dizziness, hyperglycemic or hypoglycemic events. Patient denies numbness, tingling in the extremities or nonhealing wounds of feet.  Pt is not taking a daily baby ASA. Pt is not prescribed statin (declined - does  not like idea of statin). She is a patient of weight management. RF: Hypertension, hyperlipidemia, obesity, diabetes, family history of heart disease    01/06/2022    8:49 AM 07/06/2021    9:56 AM 06/17/2020    8:13 AM 01/26/2018   10:43 AM  Depression screen PHQ 2/9  Decreased Interest 1 0 3 1  Down, Depressed, Hopeless 1 0 2 1  PHQ - 2 Score 2 0 5 2  Altered sleeping   3 1  Tired, decreased energy   3 1  Change in appetite   2 1  Feeling bad or failure about yourself    1 1  Trouble concentrating   1 1  Moving slowly or fidgety/restless   1 0  Suicidal thoughts   0 0  PHQ-9 Score   16 7  Difficult doing work/chores   Somewhat difficult Somewhat difficult      01/26/2018   10:43 AM  GAD 7 : Generalized Anxiety Score  Nervous, Anxious, on Edge 1  Control/stop worrying 1  Worry too much - different things 1  Trouble relaxing 1  Restless 1  Easily annoyed or irritable 2  Afraid - awful might happen 1  Total GAD 7 Score 8  Anxiety Difficulty Somewhat difficult    Immunization History  Administered Date(s) Administered   Influenza,inj,Quad PF,6+ Mos 06/22/2018   PFIZER(Purple Top)SARS-COV-2 Vaccination 02/11/2020, 03/03/2020   PNEUMOCOCCAL CONJUGATE-20 01/06/2022   Tdap 10/25/2014    Past Medical History:  Diagnosis Date   Allergy    Anemia    Anxiety    Arthritis    Bilateral primary osteoarthritis of knee 01/26/2018   Celiac disease 01/26/2018   Chronic depression 01/26/2018  Complication of anesthesia    woke up during a few times   Constipation    Food allergy    Gallbladder problem    GERD (gastroesophageal reflux disease)    Hypertension    Morbid obesity ([place]) 01/26/2018   Osteoarthritis    PTSD (post-traumatic stress disorder)    Sleep apnea    cpap   Vitamin B12 deficiency    due to celiac   Vitamin D deficiency    Allergies  Allergen Reactions   Neosporin [Neomycin-Bacitracin Zn-Polymyx] Rash   Tetracycline Anaphylaxis   Other Other (See  Comments)    Glutin celiac disease   Neomycin Rash    blistering   Past Surgical History:  Procedure Laterality Date   CHOLECYSTECTOMY  2015   COLONOSCOPY WITH PROPOFOL N/A 10/04/2022   Procedure: COLONOSCOPY WITH PROPOFOL;  Surgeon: Thornton Park, MD;  Location: [facility];  Service: Gastroenterology;  Laterality: N/A;   POLYPECTOMY  10/04/2022   Procedure: POLYPECTOMY;  Surgeon: Thornton Park, MD;  Location: [facility];  Service: Gastroenterology;;   SINUS IRRIGATION     Family History  Problem Relation Age of Onset   Bipolar disorder Mother    Diabetes Mother    Hypertension Mother    Heart disease Mother    Depression Mother    Obesity Mother    COPD Father    Depression Father    Early death Father    Heart disease Father    Kidney disease Father    Cardiomyopathy Father    Arthritis Father    Hypertension Father    Bipolar disorder Sister    Diabetes Brother    Heart disease Brother    Diabetes Maternal Grandmother    Bipolar disorder Maternal Grandfather    Diabetes Paternal Grandmother    Diabetes Paternal Grandfather    Hypertension Daughter    Depression Daughter    Diabetes Daughter    Rectal cancer Maternal Aunt    Stomach cancer Neg Hx    Liver disease Neg Hx    Colon cancer Neg Hx    Pancreatic cancer Neg Hx    Esophageal cancer Neg Hx    Social History   Social History Narrative   Originally from [place]; lived in [place] x 2 years prior to moving to [place] in 2018 to be near only daughter and grandkids.   Lives alone with great Dane. Divorced.    Education/employment: Masters degree.  Employed as a Landscape architect.   Safety:      -smoke alarm in the home:Yes     - wears seatbelt: Yes     - Feels safe in their relationships: Yes       Allergies as of 01/11/2023       Reactions   Neosporin [neomycin-bacitracin Zn-polymyx] Rash   Tetracycline Anaphylaxis   Other Other (See Comments)   Glutin celiac disease   Neomycin Rash    blistering        Medication List        Accurate as of January 11, 2023 10:16 AM. If you have any questions, ask your nurse or doctor.          bisoprolol 10 MG tablet Commonly known as: ZEBETA Take 1 tablet (10 mg total) by mouth daily.   cetirizine 10 MG tablet Commonly known as: ZYRTEC Take 10 mg by mouth daily.   ezetimibe 10 MG tablet Commonly known as: Zetia Take 1 tablet (10 mg total) by mouth at bedtime.  Iron 325 (65 Fe) MG Tabs Take 650 mg by mouth daily.   Magnesium 400 MG Tabs Take 400 mg by mouth daily.   Rybelsus 3 MG Tabs Generic drug: Semaglutide Take 1 tablet (3 mg total) by mouth daily.   SUPER ENZYMES PO Take 1 tablet by mouth daily.   Turmeric 500 MG Caps Take 1,000 mg by mouth daily.   Vitamin D (Ergocalciferol) 1.25 MG (50000 UNIT) Caps capsule Commonly known as: DRISDOL Take 1 capsule (50,000 Units total) by mouth every 7 (seven) days.   Zinc 50 MG Tabs Take 50 mg by mouth daily.        All past medical history, surgical history, allergies, family history, immunizations andmedications were updated in the EMR today and reviewed under the history and medication portions of their EMR.     No results found for this or any previous visit (from the past 2160 hour(s)).  ROS 14 pt review of systems performed and negative (unless mentioned in an HPI)  Objective: BP 113/72   Pulse 74   Temp 98.4 F (36.9 C)   Ht 5' 5.75" (1.67 m)   Wt (!) 315 lb 12.8 oz (143.2 kg)   LMP 02/13/2018   SpO2 96%   BMI 51.36 kg/m  Physical Exam Vitals and nursing note reviewed.  Constitutional:      General: She is not in acute distress.    Appearance: Normal appearance. She is not ill-appearing or toxic-appearing.  HENT:     Head: Normocephalic and atraumatic.     Right Ear: Tympanic membrane, ear canal and external ear normal. There is no impacted cerumen.     Left Ear: Tympanic membrane, ear canal and external ear normal. There is no  impacted cerumen.     Nose: No congestion or rhinorrhea.     Mouth/Throat:     Mouth: Mucous membranes are moist.     Pharynx: Oropharynx is clear. No oropharyngeal exudate or posterior oropharyngeal erythema.  Eyes:     General: No scleral icterus.       Right eye: No discharge.        Left eye: No discharge.     Extraocular Movements: Extraocular movements intact.     Conjunctiva/sclera: Conjunctivae normal.     Pupils: Pupils are equal, round, and reactive to light.  Cardiovascular:     Rate and Rhythm: Normal rate and regular rhythm.     Pulses: Normal pulses.     Heart sounds: Normal heart sounds. No murmur heard.    No friction rub. No gallop.  Pulmonary:     Effort: Pulmonary effort is normal. No respiratory distress.     Breath sounds: Normal breath sounds. No stridor. No wheezing, rhonchi or rales.  Chest:     Chest wall: No tenderness.  Abdominal:     General: Abdomen is flat. Bowel sounds are normal. There is no distension.     Palpations: Abdomen is soft. There is no mass.     Tenderness: There is no abdominal tenderness. There is no right CVA tenderness, left CVA tenderness, guarding or rebound.     Hernia: No hernia is present.  Musculoskeletal:        General: No swelling, tenderness or deformity. Normal range of motion.     Cervical back: Normal range of motion and neck supple. No rigidity or tenderness.     Right lower leg: No edema.     Left lower leg: No edema.  Lymphadenopathy:  Cervical: No cervical adenopathy.  Skin:    General: Skin is warm and dry.     Coloration: Skin is not jaundiced or pale.     Findings: No bruising, erythema, lesion or rash.  Neurological:     General: No focal deficit present.     Mental Status: She is alert and oriented to person, place, and time. Mental status is at baseline.     Cranial Nerves: No cranial nerve deficit.     Sensory: No sensory deficit.     Motor: No weakness.     Coordination: Coordination normal.      Gait: Gait normal.     Deep Tendon Reflexes: Reflexes normal.  Psychiatric:        Mood and Affect: Mood normal.        Behavior: Behavior normal.        Thought Content: Thought content normal.        Judgment: Judgment normal.      No results found.  Assessment/plan: Kelly Perez is a 57 y.o. female present for CPE and Chronic Conditions/illness Management Hyperlipidemia associated with type 2 diabetes mellitus ([place]) Diabetes has been managed by her weight management team. She currently is prescribed metformin 500 mg daily> prescribed by Dr. Leafy Ro A1c 6.5> 6.1> 6.2> 6.2> 6.4>5.9>5.9> collected today - Hemoglobin A1c - Urine Microalbumin w/creat. ratio Essential hypertension/lipidemia/morbid obesity/family history of heart disease Stable Continue bisoprolol 10 mg daily. Discussed different options for cholesterol management.  Continue Zetia 10 mg daily  Lipids collected today  Vitamin D deficiency Stable lab UTD Continue  supplement  Vitamin B12 deficiency Stable Continue supplement.  Lab up-to-date  Breast cancer screening by mammogram - MM 3D SCREENING MAMMOGRAM BILATERAL BREAST; Future Routine general medical examination at a health care facility - CBC with Differential/Platelet - TSH Patient was encouraged to exercise greater than 150 minutes a week. Patient was encouraged to choose a diet filled with fresh fruits and vegetables, and lean meats. AVS provided to patient today for education/recommendation on gender specific health and safety maintenance. Colonoscopy: completed 09/2022. Dr. Tarri Glenn - 29 yr f/u Mammogram: completed:[facility]/04/[facility]- [facility] > ordered placed  2024 Cervical cancer screening: last pap: 08/2022 WNL/cotest neg- 5 yr- Dr. Raoul Pitch Immunizations: tdap utd 2016, Influenza declined (encouraged yearly), shingrix declined, PNA20 UTD Infectious disease screening: HIV declined, Hep C completed DEXA: routine screen  No follow-ups on  file.  Orders Placed This Encounter  Procedures   MM 3D SCREENING MAMMOGRAM BILATERAL BREAST   CBC with Differential/Platelet   TSH   Lipid panel   Hemoglobin A1c   Urine Microalbumin w/creat. ratio    Meds ordered this encounter  Medications   bisoprolol (ZEBETA) 10 MG tablet    Sig: Take 1 tablet (10 mg total) by mouth daily.    Dispense:  90 tablet    Refill:  1   ezetimibe (ZETIA) 10 MG tablet    Sig: Take 1 tablet (10 mg total) by mouth at bedtime.    Dispense:  90 tablet    Refill:  3   Referral Orders  No referral(s) requested today     Electronically signed by: Howard Pouch, [place]

## 2023-01-11 NOTE — Patient Instructions (Addendum)
Return in about 24 weeks (around 06/28/2023).        Great to see you today.  I have refilled the medication(s) we provide.   If labs were collected, we will inform you of lab results once received either by echart message or telephone call.   - echart message- for normal results that have been seen by the patient already.   - telephone call: abnormal results or if patient has not viewed results in their echart.

## 2023-01-20 ENCOUNTER — Ambulatory Visit (INDEPENDENT_AMBULATORY_CARE_PROVIDER_SITE_OTHER): Payer: 59 | Admitting: Family Medicine

## 2023-01-20 ENCOUNTER — Encounter (INDEPENDENT_AMBULATORY_CARE_PROVIDER_SITE_OTHER): Payer: Self-pay | Admitting: Family Medicine

## 2023-01-20 VITALS — BP 111/64 | HR 77 | Temp 97.9°F | Ht 67.0 in | Wt 315.0 lb

## 2023-01-20 DIAGNOSIS — E1169 Type 2 diabetes mellitus with other specified complication: Secondary | ICD-10-CM

## 2023-01-20 DIAGNOSIS — Z6841 Body Mass Index (BMI) 40.0 and over, adult: Secondary | ICD-10-CM

## 2023-01-20 DIAGNOSIS — Z7984 Long term (current) use of oral hypoglycemic drugs: Secondary | ICD-10-CM

## 2023-01-20 DIAGNOSIS — E559 Vitamin D deficiency, unspecified: Secondary | ICD-10-CM

## 2023-01-20 DIAGNOSIS — E669 Obesity, unspecified: Secondary | ICD-10-CM

## 2023-01-20 MED ORDER — VITAMIN D (ERGOCALCIFEROL) 1.25 MG (50000 UNIT) PO CAPS: 50000.0000 [IU] | ORAL_CAPSULE | ORAL | 0 refills | Status: DC

## 2023-01-20 MED ORDER — RYBELSUS 3 MG PO TABS: 3.0000 mg | ORAL_TABLET | Freq: Every day | ORAL | 0 refills | Status: DC

## 2023-01-24 NOTE — Progress Notes (Unsigned)
Chief Complaint:   OBESITY Kelly Perez is here to discuss her progress with her obesity treatment plan along with follow-up of her obesity related diagnoses. Aubrea is on following a lower carbohydrate, vegetable and lean protein rich diet plan and states she is following her eating plan approximately 85% of the time. Kelly Perez states she is doing 0 minutes 0 times per week.  Today's visit was #: 71 Starting weight: 372 lbs Starting date: 06/17/2020 Today's weight: 315 lbs Today's date: 01/20/2023 Total lbs lost to date: 3 Total lbs lost since last in-office visit: 1  Interim History: Kelly Perez continues to do well with her weight loss.  She was traveling to a conference/convention, and she still did well overall.  Subjective:   1. Type 2 diabetes mellitus with other specified complication, without long-term current use of insulin (HCC) Kelly Perez's A1c is controlled at 6.0.  She is doing well with Rybelsus, with no nausea or vomiting.  I discussed labs with the patient today.  2. Vitamin D deficiency Kelly Perez is stable on vitamin D, with no nausea, vomiting, or muscle weakness noted.  I discussed labs with the patient today.  Assessment/Plan:   1. Type 2 diabetes mellitus with other specified complication, without long-term current use of insulin ([place]) Kelly Perez will continue Rybelsus 3 mg, and we will refill for 1 month.  - Semaglutide (RYBELSUS) 3 MG TABS; Take 1 tablet (3 mg total) by mouth daily.  Dispense: 30 tablet; Refill: 0  2. Vitamin D deficiency Kelly Perez will continue prescription vitamin D, and we will refill for 1 month.  - Vitamin D, Ergocalciferol, (DRISDOL) 1.25 MG (50000 UNIT) CAPS capsule; Take 1 capsule (50,000 Units total) by mouth every 7 (seven) days.  Dispense: 4 capsule; Refill: 0  3. BMI 45.0-49.9, adult ([place])  4. Obesity, Beginning BMI 60.04 Kelly Perez is currently in the action stage of change. As such, her goal is to continue with weight loss efforts. She has agreed to  following a lower carbohydrate, vegetable and lean protein rich diet plan.   Exercise goals: All adults should avoid inactivity. Some physical activity is better than none, and adults who participate in any amount of physical activity gain some health benefits.  Behavioral modification strategies: meal planning and cooking strategies and travel eating strategies.  Kelly Perez has agreed to follow-up with our clinic in 4 weeks. She was informed of the importance of frequent follow-up visits to maximize her success with intensive lifestyle modifications for her multiple health conditions.   Objective:   Blood pressure 111/64, pulse 77, temperature 97.9 F (36.6 C), height 5\' 7"  (1.702 m), weight (!) 315 lb (142.9 kg), last menstrual period 02/13/2018, SpO2 100 %. Body mass index is 49.34 kg/m.  Lab Results  Component Value Date   CREATININE 0.64 09/30/2022   BUN 19 09/30/2022   NA 140 09/30/2022   K 4.9 09/30/2022   CL 101 09/30/2022   CO2 22 09/30/2022   Lab Results  Component Value Date   ALT 23 09/30/2022   AST 15 09/30/2022   ALKPHOS 107 09/30/2022   BILITOT 0.4 09/30/2022   Lab Results  Component Value Date   HGBA1C 6.0 01/11/2023   HGBA1C 5.9 (H) 09/30/2022   HGBA1C 5.9 (H) 05/04/2022   HGBA1C 6.4 01/06/2022   HGBA1C 6.2 (H) 09/08/2021   Lab Results  Component Value Date   INSULIN 14.1 09/30/2022   INSULIN 10.5 05/04/2022   INSULIN 12.4 09/08/2021   INSULIN 14.9 06/11/2021   INSULIN 13.9 02/05/2021  Lab Results  Component Value Date   TSH 2.05 01/11/2023   Lab Results  Component Value Date   CHOL 219 (H) 01/11/2023   HDL 47.00 01/11/2023   LDLCALC 139 (H) 01/11/2023   TRIG 162.0 (H) 01/11/2023   CHOLHDL 5 01/11/2023   Lab Results  Component Value Date   VD25OH 50.2 09/30/2022   VD25OH 58.8 05/04/2022   VD25OH 56.0 02/04/2022   Lab Results  Component Value Date   WBC 8.7 01/11/2023   HGB 14.4 01/11/2023   HCT 43.2 01/11/2023   MCV 87.3 01/11/2023    PLT 429.0 (H) 01/11/2023   Lab Results  Component Value Date   IRON 82 01/26/2018   TIBC 391 01/26/2018   FERRITIN 35 01/26/2018   Attestation Statements:   Reviewed by clinician on day of visit: allergies, medications, problem list, medical history, surgical history, family history, social history, and previous encounter notes.   I, Trixie Dredge, am acting as transcriptionist for Dennard Nip, MD.  I have reviewed the above documentation for accuracy and completeness, and I agree with the above. -  Dennard Nip, MD

## 2023-02-17 ENCOUNTER — Encounter (INDEPENDENT_AMBULATORY_CARE_PROVIDER_SITE_OTHER): Payer: Self-pay | Admitting: Family Medicine

## 2023-02-17 ENCOUNTER — Ambulatory Visit (INDEPENDENT_AMBULATORY_CARE_PROVIDER_SITE_OTHER): Payer: 59 | Admitting: Family Medicine

## 2023-02-17 VITALS — BP 119/75 | HR 72 | Temp 98.1°F | Ht 67.0 in | Wt 318.0 lb

## 2023-02-17 DIAGNOSIS — E1169 Type 2 diabetes mellitus with other specified complication: Secondary | ICD-10-CM | POA: Diagnosis not present

## 2023-02-17 DIAGNOSIS — E559 Vitamin D deficiency, unspecified: Secondary | ICD-10-CM | POA: Diagnosis not present

## 2023-02-17 DIAGNOSIS — E669 Obesity, unspecified: Secondary | ICD-10-CM | POA: Diagnosis not present

## 2023-02-17 DIAGNOSIS — Z6841 Body Mass Index (BMI) 40.0 and over, adult: Secondary | ICD-10-CM

## 2023-02-17 DIAGNOSIS — Z7984 Long term (current) use of oral hypoglycemic drugs: Secondary | ICD-10-CM

## 2023-02-17 MED ORDER — VITAMIN D (ERGOCALCIFEROL) 1.25 MG (50000 UNIT) PO CAPS: 50000.0000 [IU] | ORAL_CAPSULE | ORAL | 0 refills | Status: DC

## 2023-02-17 MED ORDER — RYBELSUS 7 MG PO TABS: 7.0000 mg | ORAL_TABLET | Freq: Every day | ORAL | 0 refills | Status: DC

## 2023-02-22 NOTE — Progress Notes (Signed)
Chief Complaint:   OBESITY Kelly Perez is here to discuss her progress with her obesity treatment plan along with follow-up of her obesity related diagnoses. [name] is on following a lower carbohydrate, vegetable and lean protein rich diet plan and states she is following her eating plan approximately 75% of the time. [name] states she is walking for 15 minutes 7 times per week.  Today's visit was #: 38 Starting weight: 372 lbs Starting date: 06/17/2020 Today's weight: 318 lbs Today's date: 02/17/2023 Total lbs lost to date: 54 Total lbs lost since last in-office visit: 0  Interim History: Shalene did a lot of traveling and had to eat out.  She did better car snacking and increased her protein.  She feels now that she is back she will be able to meal plan.  Subjective:   1. Type 2 diabetes mellitus with other specified complication, without long-term current use of insulin (HCC) [name] is doing well on Rybelsus, with no nausea or vomiting but she is on a very low-dose.  Her last A1c was at 6.0.  2. Vitamin D deficiency [name] is on vitamin D, and her last vitamin D level was at goal.  Assessment/Plan:   1. Type 2 diabetes mellitus with other specified complication, without long-term current use of insulin (HCC) [name] agreed to increase Rybelsus to 7 mg once daily, and we will refill for 1 month.  - Semaglutide (RYBELSUS) 7 MG TABS; Take 1 tablet (7 mg total) by mouth daily.  Dispense: 30 tablet; Refill: 0  2. Vitamin D deficiency [name] will continue prescription vitamin D, and we will refill for 1 month.  - Vitamin D, Ergocalciferol, (DRISDOL) 1.25 MG (50000 UNIT) CAPS capsule; Take 1 capsule (50,000 Units total) by mouth every 7 (seven) days.  Dispense: 4 capsule; Refill: 0  3. BMI 45.0-49.9, adult (HCC)  4. Obesity, Beginning BMI 60.04 [name] is currently in the action stage of change. As such, her goal is to continue with weight loss efforts. She has agreed to following a lower  carbohydrate, vegetable and lean protein rich diet plan.   Exercise goals: As is.  Behavioral modification strategies: increasing lean protein intake.  [name] has agreed to follow-up with our clinic in 4 weeks. She was informed of the importance of frequent follow-up visits to maximize her success with intensive lifestyle modifications for her multiple health conditions.   Objective:   Blood pressure 119/75, pulse 72, temperature 98.1 F (36.7 C), height 5\' 7"  (1.702 m), weight (!) 318 lb (144.2 kg), last menstrual period 02/13/2018, SpO2 95 %. Body mass index is 49.81 kg/m.  Lab Results  Component Value Date   CREATININE 0.64 09/30/2022   BUN 19 09/30/2022   NA 140 09/30/2022   K 4.9 09/30/2022   CL 101 09/30/2022   CO2 22 09/30/2022   Lab Results  Component Value Date   ALT 23 09/30/2022   AST 15 09/30/2022   ALKPHOS 107 09/30/2022   BILITOT 0.4 09/30/2022   Lab Results  Component Value Date   HGBA1C 6.0 01/11/2023   HGBA1C 5.9 (H) 09/30/2022   HGBA1C 5.9 (H) 05/04/2022   HGBA1C 6.4 01/06/2022   HGBA1C 6.2 (H) 09/08/2021   Lab Results  Component Value Date   INSULIN 14.1 09/30/2022   INSULIN 10.5 05/04/2022   INSULIN 12.4 09/08/2021   INSULIN 14.9 06/11/2021   INSULIN 13.9 02/05/2021   Lab Results  Component Value Date   TSH 2.05 01/11/2023   Lab Results  Component  Value Date   CHOL 219 (H) 01/11/2023   HDL 47.00 01/11/2023   LDLCALC 139 (H) 01/11/2023   TRIG 162.0 (H) 01/11/2023   CHOLHDL 5 01/11/2023   Lab Results  Component Value Date   VD25OH 50.2 09/30/2022   VD25OH 58.8 05/04/2022   VD25OH 56.0 02/04/2022   Lab Results  Component Value Date   WBC 8.7 01/11/2023   HGB 14.4 01/11/2023   HCT 43.2 01/11/2023   MCV 87.3 01/11/2023   PLT 429.0 (H) 01/11/2023   Lab Results  Component Value Date   IRON 82 01/26/2018   TIBC 391 01/26/2018   FERRITIN 35 01/26/2018   Attestation Statements:   Reviewed by clinician on day of visit:  allergies, medications, problem list, medical history, surgical history, family history, social history, and previous encounter notes.   I, Burt Knack, am acting as transcriptionist for Quillian Quince, MD.  I have reviewed the above documentation for accuracy and completeness, and I agree with the above. -  Quillian Quince, MD

## 2023-03-14 LAB — HM DIABETES EYE EXAM

## 2023-03-24 ENCOUNTER — Ambulatory Visit (INDEPENDENT_AMBULATORY_CARE_PROVIDER_SITE_OTHER): Payer: 59 | Admitting: Family Medicine

## 2023-03-24 ENCOUNTER — Encounter (INDEPENDENT_AMBULATORY_CARE_PROVIDER_SITE_OTHER): Payer: Self-pay | Admitting: Family Medicine

## 2023-03-24 VITALS — BP 147/84 | HR 90 | Temp 98.5°F | Ht 67.0 in | Wt 321.0 lb

## 2023-03-24 DIAGNOSIS — E559 Vitamin D deficiency, unspecified: Secondary | ICD-10-CM

## 2023-03-24 DIAGNOSIS — Z7984 Long term (current) use of oral hypoglycemic drugs: Secondary | ICD-10-CM

## 2023-03-24 DIAGNOSIS — E669 Obesity, unspecified: Secondary | ICD-10-CM | POA: Diagnosis not present

## 2023-03-24 DIAGNOSIS — E1169 Type 2 diabetes mellitus with other specified complication: Secondary | ICD-10-CM

## 2023-03-24 DIAGNOSIS — Z6841 Body Mass Index (BMI) 40.0 and over, adult: Secondary | ICD-10-CM

## 2023-03-24 MED ORDER — RYBELSUS 7 MG PO TABS: 7.0000 mg | ORAL_TABLET | Freq: Every day | ORAL | 0 refills | Status: DC

## 2023-03-24 MED ORDER — VITAMIN D (ERGOCALCIFEROL) 1.25 MG (50000 UNIT) PO CAPS: 50000.0000 [IU] | ORAL_CAPSULE | ORAL | 0 refills | Status: DC

## 2023-03-28 NOTE — Progress Notes (Unsigned)
Chief Complaint:   OBESITY Kelly Perez is here to discuss her progress with her obesity treatment plan along with follow-up of her obesity related diagnoses. Kelly Perez is on following a lower carbohydrate, vegetable and lean protein rich diet plan and states she is following her eating plan approximately 75% of the time. Kelly Perez states she is walking the dog for 15 minutes 7 times per week.  Today's visit was #: 39 Starting weight: 372 lbs Starting date: 06/17/2020 Today's weight: 321 lbs Today's date: 03/24/2023 Total lbs lost to date: 51 Total lbs lost since last in-office visit: 0  Interim History: Patient is bored with her choices while eating the low carbohydrate plan.  She has been trying to find other options and she is doing well overall.  She is retaining a bit of water weight today.  Subjective:   1. Vitamin D deficiency Patient's last vitamin D level was at goal.  She is at high risk of over replacement.  2. Type 2 diabetes mellitus with other specified complication, without long-term current use of insulin (HCC) Patient's last A1c was 6.0, and she notes decrease in fatigue.  Assessment/Plan:   1. Vitamin D deficiency We will refill prescription vitamin D for 1 month, and we will recheck labs in 1 to 2 months.  - Vitamin D, Ergocalciferol, (DRISDOL) 1.25 MG (50000 UNIT) CAPS capsule; Take 1 capsule (50,000 Units total) by mouth every 7 (seven) days.  Dispense: 4 capsule; Refill: 0  2. Type 2 diabetes mellitus with other specified complication, without long-term current use of insulin (HCC) We will refill Rybelsus at 7 mg for 1 month.  Patient will continue with her diet and we will recheck labs in 1 to 2 months.  - Semaglutide (RYBELSUS) 7 MG TABS; Take 1 tablet (7 mg total) by mouth daily.  Dispense: 30 tablet; Refill: 0  3. BMI 50.0-59.9, adult (HCC)  4. Obesity,starting BMI 60.04 Kelly Perez is currently in the action stage of change. As such, her goal is to continue with  weight loss efforts. She has agreed to following a lower carbohydrate, vegetable and lean protein rich diet plan.   Exercise goals: As is.   Behavioral modification strategies: increasing lean protein intake.  Kelly Perez has agreed to follow-up with our clinic in 4 weeks. She was informed of the importance of frequent follow-up visits to maximize her success with intensive lifestyle modifications for her multiple health conditions.   Objective:   Blood pressure (!) 147/84, pulse 90, temperature 98.5 F (36.9 C), height 5\' 7"  (1.702 m), weight (!) 321 lb (145.6 kg), last menstrual period 02/13/2018, SpO2 95 %. Body mass index is 50.28 kg/m.  Lab Results  Component Value Date   CREATININE 0.64 09/30/2022   BUN 19 09/30/2022   NA 140 09/30/2022   K 4.9 09/30/2022   CL 101 09/30/2022   CO2 22 09/30/2022   Lab Results  Component Value Date   ALT 23 09/30/2022   AST 15 09/30/2022   ALKPHOS 107 09/30/2022   BILITOT 0.4 09/30/2022   Lab Results  Component Value Date   HGBA1C 6.0 01/11/2023   HGBA1C 5.9 (H) 09/30/2022   HGBA1C 5.9 (H) 05/04/2022   HGBA1C 6.4 01/06/2022   HGBA1C 6.2 (H) 09/08/2021   Lab Results  Component Value Date   INSULIN 14.1 09/30/2022   INSULIN 10.5 05/04/2022   INSULIN 12.4 09/08/2021   INSULIN 14.9 06/11/2021   INSULIN 13.9 02/05/2021   Lab Results  Component Value Date  TSH 2.05 01/11/2023   Lab Results  Component Value Date   CHOL 219 (H) 01/11/2023   HDL 47.00 01/11/2023   LDLCALC 139 (H) 01/11/2023   TRIG 162.0 (H) 01/11/2023   CHOLHDL 5 01/11/2023   Lab Results  Component Value Date   VD25OH 50.2 09/30/2022   VD25OH 58.8 05/04/2022   VD25OH 56.0 02/04/2022   Lab Results  Component Value Date   WBC 8.7 01/11/2023   HGB 14.4 01/11/2023   HCT 43.2 01/11/2023   MCV 87.3 01/11/2023   PLT 429.0 (H) 01/11/2023   Lab Results  Component Value Date   IRON 82 01/26/2018   TIBC 391 01/26/2018   FERRITIN 35 01/26/2018   Attestation  Statements:   Reviewed by clinician on day of visit: allergies, medications, problem list, medical history, surgical history, family history, social history, and previous encounter notes.   I, Burt Knack, am acting as transcriptionist for Quillian Quince, MD.  I have reviewed the above documentation for accuracy and completeness, and I agree with the above. -  Quillian Quince, MD

## 2023-03-30 ENCOUNTER — Other Ambulatory Visit: Payer: Self-pay | Admitting: Family Medicine

## 2023-04-14 ENCOUNTER — Other Ambulatory Visit (INDEPENDENT_AMBULATORY_CARE_PROVIDER_SITE_OTHER): Payer: Self-pay | Admitting: Family Medicine

## 2023-04-14 DIAGNOSIS — E559 Vitamin D deficiency, unspecified: Secondary | ICD-10-CM

## 2023-04-17 ENCOUNTER — Other Ambulatory Visit (INDEPENDENT_AMBULATORY_CARE_PROVIDER_SITE_OTHER): Payer: Self-pay | Admitting: Family Medicine

## 2023-04-17 DIAGNOSIS — E1169 Type 2 diabetes mellitus with other specified complication: Secondary | ICD-10-CM

## 2023-04-21 ENCOUNTER — Encounter (INDEPENDENT_AMBULATORY_CARE_PROVIDER_SITE_OTHER): Payer: Self-pay | Admitting: Family Medicine

## 2023-04-21 ENCOUNTER — Ambulatory Visit (INDEPENDENT_AMBULATORY_CARE_PROVIDER_SITE_OTHER): Payer: 59 | Admitting: Family Medicine

## 2023-04-21 VITALS — BP 105/69 | HR 81 | Temp 97.9°F | Ht 67.0 in | Wt 324.0 lb

## 2023-04-21 DIAGNOSIS — E559 Vitamin D deficiency, unspecified: Secondary | ICD-10-CM

## 2023-04-21 DIAGNOSIS — F4321 Adjustment disorder with depressed mood: Secondary | ICD-10-CM

## 2023-04-21 DIAGNOSIS — Z6841 Body Mass Index (BMI) 40.0 and over, adult: Secondary | ICD-10-CM

## 2023-04-21 DIAGNOSIS — E669 Obesity, unspecified: Secondary | ICD-10-CM

## 2023-04-21 DIAGNOSIS — Z7984 Long term (current) use of oral hypoglycemic drugs: Secondary | ICD-10-CM

## 2023-04-21 DIAGNOSIS — E1169 Type 2 diabetes mellitus with other specified complication: Secondary | ICD-10-CM | POA: Diagnosis not present

## 2023-04-21 MED ORDER — VITAMIN D (ERGOCALCIFEROL) 1.25 MG (50000 UNIT) PO CAPS
50000.0000 [IU] | ORAL_CAPSULE | ORAL | 0 refills | Status: DC
Start: 2023-04-21 — End: 2023-05-19

## 2023-04-21 MED ORDER — RYBELSUS 7 MG PO TABS: 7.0000 mg | ORAL_TABLET | Freq: Every day | ORAL | 0 refills | Status: DC

## 2023-04-21 NOTE — Progress Notes (Signed)
[facility]  Office: [phone]  /  Fax: [phone]  WEIGHT SUMMARY AND BIOMETRICS  Anthropometric Measurements Height: 5\' 7"  (1.702 m) Weight: (!) 324 lb (147 kg) BMI (Calculated): 50.73 Weight at Last Visit: 321 lb Weight Lost Since Last Visit: 0 Weight Gained Since Last Visit: 3 lb Starting Weight: 372 lb Total Weight Loss (lbs): 48 lb (21.8 kg) Peak Weight: 372 lb   Body Composition  Body Fat %: 57.1 % Fat Mass (lbs): 185.2 lbs Muscle Mass (lbs): 132.2 lbs Total Body Water (lbs): 104 lbs Visceral Fat Rating : 22   Other Clinical Data Fasting: no Labs: no Today's Visit #: 40 Starting Date: 06/17/20    Chief Complaint: OBESITY   Discussed the use of AI scribe software for clinical note transcription with the patient, who gave verbal consent to proceed.  History of Present Illness   The patient is a 57 year old individual with a history of diabetes, obesity, and vitamin D deficiency. She reports a weight gain of three pounds over the past month, despite attempts to adhere to a low-carb diet and regular exercise. The patient has been experiencing emotional distress due to the recent loss of her pet, which has impacted her eating habits and sleep patterns. She reports not being fully aware of her dietary intake during the acute grieving period and has been using Benadryl to aid sleep when necessary.  The patient has been trying to maintain physical activity, walking daily and using a trampoline for short periods to avoid overexertion. She reports that this exercise has been beneficial for her knee discomfort.  Regarding her diabetes management, the patient has been taking Rybelsus without any stomach upset, unlike her previous experience with Metformin. Her last A1C was 6, and she is curious to see if there have been any changes since switching to Rybelsus.  The patient is also on a weekly dose of 50,000 units of vitamin D for her deficiency. Her last vitamin D level was  50.2.  The patient is currently in the process of adopting a new pet, hoping that this will provide some emotional support and alleviate the loneliness she has been experiencing.          PHYSICAL EXAM:  Blood pressure 105/69, pulse 81, temperature 97.9 F (36.6 C), height 5\' 7"  (1.702 m), weight (!) 324 lb (147 kg), last menstrual period 02/13/2018, SpO2 95 %. Body mass index is 50.75 kg/m.  DIAGNOSTIC DATA REVIEWED:  BMET    Component Value Date/Time   NA 140 09/30/2022 0840   K 4.9 09/30/2022 0840   CL 101 09/30/2022 0840   CO2 22 09/30/2022 0840   GLUCOSE 108 (H) 09/30/2022 0840   GLUCOSE 93 01/26/2018 1052   BUN 19 09/30/2022 0840   CREATININE 0.64 09/30/2022 0840   CALCIUM 10.1 09/30/2022 0840   GFRNONAA 100 09/29/2020 1058   GFRAA 115 09/29/2020 1058   Lab Results  Component Value Date   HGBA1C 6.0 01/11/2023   HGBA1C 6.4 (H) 06/17/2020   Lab Results  Component Value Date   INSULIN 14.1 09/30/2022   INSULIN 27.4 (H) 06/17/2020   Lab Results  Component Value Date   TSH 2.05 01/11/2023   CBC    Component Value Date/Time   WBC 8.7 01/11/2023 0924   RBC 4.95 01/11/2023 0924   HGB 14.4 01/11/2023 0924   HGB 14.0 05/04/2022 0918   HCT 43.2 01/11/2023 0924   HCT 43.9 05/04/2022 0918   PLT 429.0 (H) 01/11/2023 0924   PLT 437 05/04/2022  0918   MCV 87.3 01/11/2023 0924   MCV 86 05/04/2022 0918   MCH 27.5 05/04/2022 0918   MCHC 33.3 01/11/2023 0924   RDW 14.2 01/11/2023 0924   RDW 13.5 05/04/2022 0918   Iron Studies    Component Value Date/Time   IRON 82 01/26/2018 1052   TIBC 391 01/26/2018 1052   FERRITIN 35 01/26/2018 1052   IRONPCTSAT 21 01/26/2018 1052   Lipid Panel     Component Value Date/Time   CHOL 219 (H) 01/11/2023 0924   CHOL 206 (H) 09/30/2022 0840   CHOL 239 (H) 04/14/2021 1524   TRIG 162.0 (H) 01/11/2023 0924   TRIG 157 (H) 04/14/2021 1524   HDL 47.00 01/11/2023 0924   HDL 55 09/30/2022 0840   CHOLHDL 5 01/11/2023 0924    VLDL 32.4 01/11/2023 0924   LDLCALC 139 (H) 01/11/2023 0924   LDLCALC 132 (H) 09/30/2022 0840   Hepatic Function Panel     Component Value Date/Time   PROT 7.3 09/30/2022 0840   ALBUMIN 4.6 09/30/2022 0840   AST 15 09/30/2022 0840   ALT 23 09/30/2022 0840   ALKPHOS 107 09/30/2022 0840   BILITOT 0.4 09/30/2022 0840      Component Value Date/Time   TSH 2.05 01/11/2023 0924   Nutritional Lab Results  Component Value Date   VD25OH 50.2 09/30/2022   VD25OH 58.8 05/04/2022   VD25OH 56.0 02/04/2022     Assessment and Plan    Grieving Reaction: Recent loss of pet causing emotional distress and potential impact on eating and sleeping habits. Patient has support from friends and family and is considering adopting a new pet. -Encouraged patient to continue seeking support from friends and family. -Advised patient to be mindful of potential comfort eating as she moves into chronic acceptance phase of grieving.  Type 2 Diabetes Mellitus: Last A1C was 6.0 prior to starting Rybelsus. Patient tolerating Rybelsus well without gastrointestinal side effects. -Continue Rybelsus. -Check A1C at next visit on 05/19/2023.  Obesity: Weight increased by 3 pounds over the last month. Patient has been following a low carb diet approximately 75% of the time and has been exercising 5-15 minutes daily. -Encouraged patient to continue low carb diet and daily exercise. -Plan to reassess weight at next visit.  Vitamin D Deficiency: Last Vitamin D level was 50.2. Patient is taking 50,000 units of Vitamin D weekly. -Continue Vitamin D 50,000 units weekly. -Refill Vitamin D prescription.  Follow-up in 1 month on 05/19/2023.       She was informed of the importance of frequent follow up visits to maximize her success with intensive lifestyle modifications for her multiple health conditions.    Quillian Quince, MD

## 2023-05-15 ENCOUNTER — Other Ambulatory Visit (INDEPENDENT_AMBULATORY_CARE_PROVIDER_SITE_OTHER): Payer: Self-pay | Admitting: Family Medicine

## 2023-05-15 DIAGNOSIS — E559 Vitamin D deficiency, unspecified: Secondary | ICD-10-CM

## 2023-05-19 ENCOUNTER — Encounter (INDEPENDENT_AMBULATORY_CARE_PROVIDER_SITE_OTHER): Payer: Self-pay | Admitting: Family Medicine

## 2023-05-19 ENCOUNTER — Ambulatory Visit (INDEPENDENT_AMBULATORY_CARE_PROVIDER_SITE_OTHER): Payer: 59 | Admitting: Family Medicine

## 2023-05-19 VITALS — BP 116/80 | HR 73 | Temp 98.3°F | Ht 67.0 in | Wt 317.0 lb

## 2023-05-19 DIAGNOSIS — E669 Obesity, unspecified: Secondary | ICD-10-CM | POA: Diagnosis not present

## 2023-05-19 DIAGNOSIS — E785 Hyperlipidemia, unspecified: Secondary | ICD-10-CM | POA: Diagnosis not present

## 2023-05-19 DIAGNOSIS — E1169 Type 2 diabetes mellitus with other specified complication: Secondary | ICD-10-CM

## 2023-05-19 DIAGNOSIS — Z6841 Body Mass Index (BMI) 40.0 and over, adult: Secondary | ICD-10-CM

## 2023-05-19 DIAGNOSIS — Z7985 Long-term (current) use of injectable non-insulin antidiabetic drugs: Secondary | ICD-10-CM

## 2023-05-19 DIAGNOSIS — E559 Vitamin D deficiency, unspecified: Secondary | ICD-10-CM | POA: Diagnosis not present

## 2023-05-19 MED ORDER — VITAMIN D (ERGOCALCIFEROL) 1.25 MG (50000 UNIT) PO CAPS: 50000.0000 [IU] | ORAL_CAPSULE | ORAL | 0 refills | Status: DC

## 2023-05-19 MED ORDER — RYBELSUS 7 MG PO TABS: 7.0000 mg | ORAL_TABLET | Freq: Every day | ORAL | 0 refills | Status: DC

## 2023-05-20 LAB — LIPID PANEL WITH LDL/HDL RATIO: Cholesterol, Total: 206 mg/dL — ABNORMAL HIGH (ref 100–199)

## 2023-05-20 LAB — CBC WITH DIFFERENTIAL/PLATELET
Hemoglobin: 14.2 g/dL (ref 11.1–15.9)
WBC: 7.8 10*3/uL (ref 3.4–10.8)

## 2023-05-20 LAB — CMP14+EGFR
BUN: 18 mg/dL (ref 6–24)
Glucose: 103 mg/dL — ABNORMAL HIGH (ref 70–99)

## 2023-05-23 ENCOUNTER — Other Ambulatory Visit (INDEPENDENT_AMBULATORY_CARE_PROVIDER_SITE_OTHER): Payer: Self-pay | Admitting: Family Medicine

## 2023-05-23 DIAGNOSIS — E1169 Type 2 diabetes mellitus with other specified complication: Secondary | ICD-10-CM

## 2023-05-23 NOTE — Progress Notes (Signed)
Chief Complaint:   OBESITY Kelly Perez is here to discuss her progress with her obesity treatment plan along with follow-up of her obesity related diagnoses. Kelly Perez is on following a lower carbohydrate, vegetable and lean protein rich diet plan and states she is following her eating plan approximately 90% of the time. Kelly Perez states she is walking and on the trampoline for 35 minutes 7 times per week.  Today's visit was #: 41 Starting weight: 372 lbs Starting date: 06/17/2020 Today's weight: 317 lbs Today's date: 05/19/2023 Total lbs lost to date: 55 Total lbs lost since last in-office visit: 7  Interim History: Patient continues to do well with her weight loss.  She has a new dog who is keeping her more active.  Her hunger is controlled.  Subjective:   1. Type 2 diabetes mellitus with other specified complication, without long-term current use of insulin (HCC) Patient is stable on her medications and on her diet, and she is due for labs.  2. Vitamin D deficiency Patient is on vitamin D, and she is due for labs.  She has no signs of over replacement.  3. Hyperlipidemia associated with type 2 diabetes mellitus (HCC) Patient is working on her diet, exercise, and weight loss.  She is on Zetia but she is not on a statin.  Assessment/Plan:   1. Type 2 diabetes mellitus with other specified complication, without long-term current use of insulin (HCC) We will check labs today, and we will follow-up at her next visit.  Patient will continue Rybelsus 7 mg, and we will refill for 1 month.  - Semaglutide (RYBELSUS) 7 MG TABS; Take 1 tablet (7 mg total) by mouth daily.  Dispense: 30 tablet; Refill: 0 - Insulin, random - Hemoglobin A1c  2. Vitamin D deficiency We will check labs today, and we will follow-up at her next visit.  Patient will continue prescription vitamin D once weekly, and we will refill for 1 month.  - Vitamin D, Ergocalciferol, (DRISDOL) 1.25 MG (50000 UNIT) CAPS capsule; Take  1 capsule (50,000 Units total) by mouth every 7 (seven) days.  Dispense: 4 capsule; Refill: 0  3. Hyperlipidemia associated with type 2 diabetes mellitus (HCC) We will check labs today, and we will follow-up at her next visit.  Patient will continue her diet and weight loss.  - CBC with Differential/Platelet - CMP14+EGFR - Lipid Panel With LDL/HDL Ratio  4. BMI 45.0-49.9, adult (HCC)  5. Obesity, Beginning BMI 60.04 Kelly Perez is currently in the action stage of change. As such, her goal is to continue with weight loss efforts. She has agreed to following a lower carbohydrate, vegetable and lean protein rich diet plan.   Exercise goals: As is.  Behavioral modification strategies: increasing lean protein intake and no skipping meals.  Kelly Perez has agreed to follow-up with our clinic in 4 weeks. She was informed of the importance of frequent follow-up visits to maximize her success with intensive lifestyle modifications for her multiple health conditions.   Kelly Perez was informed we would discuss her lab results at her next visit unless there is a critical issue that needs to be addressed sooner. Kelly Perez agreed to keep her next visit at the agreed upon time to discuss these results.  Objective:   Blood pressure 116/80, pulse 73, temperature 98.3 F (36.8 C), height 5\' 7"  (1.702 m), weight (!) 317 lb (143.8 kg), last menstrual period 02/13/2018, SpO2 94%. Body mass index is 49.65 kg/m.  Lab Results  Component Value Date  CREATININE 0.65 05/19/2023   BUN 18 05/19/2023   NA 141 05/19/2023   K 5.1 05/19/2023   CL 103 05/19/2023   CO2 23 05/19/2023   Lab Results  Component Value Date   ALT 39 (H) 05/19/2023   AST 17 05/19/2023   ALKPHOS 109 05/19/2023   BILITOT 0.4 05/19/2023   Lab Results  Component Value Date   HGBA1C 6.1 (H) 05/19/2023   HGBA1C 6.0 01/11/2023   HGBA1C 5.9 (H) 09/30/2022   HGBA1C 5.9 (H) 05/04/2022   HGBA1C 6.4 01/06/2022   Lab Results  Component Value Date    INSULIN 18.7 05/19/2023   INSULIN 14.1 09/30/2022   INSULIN 10.5 05/04/2022   INSULIN 12.4 09/08/2021   INSULIN 14.9 06/11/2021   Lab Results  Component Value Date   TSH 2.05 01/11/2023   Lab Results  Component Value Date   CHOL 206 (H) 05/19/2023   HDL 51 05/19/2023   LDLCALC 134 (H) 05/19/2023   TRIG 115 05/19/2023   CHOLHDL 5 01/11/2023   Lab Results  Component Value Date   VD25OH 50.2 09/30/2022   VD25OH 58.8 05/04/2022   VD25OH 56.0 02/04/2022   Lab Results  Component Value Date   WBC 7.8 05/19/2023   HGB 14.2 05/19/2023   HCT 43.9 05/19/2023   MCV 87 05/19/2023   PLT 435 05/19/2023   Lab Results  Component Value Date   IRON 82 01/26/2018   TIBC 391 01/26/2018   FERRITIN 35 01/26/2018   Attestation Statements:   Reviewed by clinician on day of visit: allergies, medications, problem list, medical history, surgical history, family history, social history, and previous encounter notes.   I, Burt Knack, am acting as transcriptionist for Quillian Quince, MD.  I have reviewed the above documentation for accuracy and completeness, and I agree with the above. -  Quillian Quince, MD

## 2023-05-26 MED ORDER — RYBELSUS 7 MG PO TABS
7.0000 mg | ORAL_TABLET | Freq: Every day | ORAL | 0 refills | Status: DC
Start: 2023-05-26 — End: 2023-07-14

## 2023-05-27 ENCOUNTER — Other Ambulatory Visit: Payer: Self-pay | Admitting: Family Medicine

## 2023-06-06 LAB — HM MAMMOGRAPHY

## 2023-06-08 ENCOUNTER — Encounter: Payer: Self-pay | Admitting: Family Medicine

## 2023-06-14 ENCOUNTER — Other Ambulatory Visit (INDEPENDENT_AMBULATORY_CARE_PROVIDER_SITE_OTHER): Payer: Self-pay | Admitting: Family Medicine

## 2023-06-14 DIAGNOSIS — E559 Vitamin D deficiency, unspecified: Secondary | ICD-10-CM

## 2023-06-16 ENCOUNTER — Ambulatory Visit (INDEPENDENT_AMBULATORY_CARE_PROVIDER_SITE_OTHER): Payer: 59 | Admitting: Family Medicine

## 2023-06-16 ENCOUNTER — Encounter (INDEPENDENT_AMBULATORY_CARE_PROVIDER_SITE_OTHER): Payer: Self-pay | Admitting: Family Medicine

## 2023-06-16 VITALS — BP 154/65 | HR 66 | Temp 98.0°F | Ht 67.0 in | Wt 319.0 lb

## 2023-06-16 DIAGNOSIS — E559 Vitamin D deficiency, unspecified: Secondary | ICD-10-CM

## 2023-06-16 DIAGNOSIS — Z6841 Body Mass Index (BMI) 40.0 and over, adult: Secondary | ICD-10-CM | POA: Diagnosis not present

## 2023-06-16 DIAGNOSIS — E1169 Type 2 diabetes mellitus with other specified complication: Secondary | ICD-10-CM

## 2023-06-16 DIAGNOSIS — E669 Obesity, unspecified: Secondary | ICD-10-CM

## 2023-06-16 DIAGNOSIS — Z7985 Long-term (current) use of injectable non-insulin antidiabetic drugs: Secondary | ICD-10-CM

## 2023-06-16 MED ORDER — VITAMIN D (ERGOCALCIFEROL) 1.25 MG (50000 UNIT) PO CAPS
50000.0000 [IU] | ORAL_CAPSULE | ORAL | 0 refills | Status: DC
Start: 2023-06-16 — End: 2023-07-14

## 2023-06-20 NOTE — Progress Notes (Signed)
Chief Complaint:   OBESITY Kelly Perez is here to discuss her progress with her obesity treatment plan along with follow-up of her obesity related diagnoses. Cheryl is on following a lower carbohydrate, vegetable and lean protein rich diet plan and states she is following her eating plan approximately 90% of the time. [name] states she is walking for 45 minutes 7 times per week.  Today's visit was #: 42 Starting weight: 372 lbs Starting date: 06/17/2020 Today's weight: 319 lbs Today's date: 06/16/2023 Total lbs lost to date: 53 Total lbs lost since last in-office visit: 0  Interim History: Patient is retaining some water weight.  Her hunger is controlled.  She has celiac disease and she is doing well with making gluten-free choices.  She is exercising more and walking, and she is training her new puppy.  Subjective:   1. Vitamin D deficiency Patient is on vitamin D, and she needs a 90-day refill.  No side effects were noted.  2. Type 2 diabetes mellitus with other specified complication, without long-term current use of insulin (HCC) Patient is stable on Rybelsus, and she is working on her weight loss, diet, and exercise.  Assessment/Plan:   1. Vitamin D deficiency Patient will continue prescription vitamin D once weekly, and we will refill for 90 days.  - Vitamin D, Ergocalciferol, (DRISDOL) 1.25 MG (50000 UNIT) CAPS capsule; Take 1 capsule (50,000 Units total) by mouth every 7 (seven) days.  Dispense: 13 capsule; Refill: 0  2. Type 2 diabetes mellitus with other specified complication, without long-term current use of insulin [facility]) Patient will continue Rybelsus as is, and we will continue to follow.  3. BMI 50.0-59.9, adult (HCC)  4. Obesity, Beginning BMI 60.04 [name] is currently in the action stage of change. As such, her goal is to continue with weight loss efforts. She has agreed to following a lower carbohydrate, vegetable and lean protein rich diet plan.   Exercise goals:  As is.   Behavioral modification strategies: increasing lean protein intake.  [name] has agreed to follow-up with our clinic in 4 weeks. She was informed of the importance of frequent follow-up visits to maximize her success with intensive lifestyle modifications for her multiple health conditions.   Objective:   Blood pressure (!) 154/65, pulse 66, temperature 98 F (36.7 C), height 5\' 7"  (1.702 m), weight (!) 319 lb (144.7 kg), last menstrual period 02/13/2018, SpO2 94%. Body mass index is 49.96 kg/m.  Lab Results  Component Value Date   CREATININE 0.65 05/19/2023   BUN 18 05/19/2023   NA 141 05/19/2023   K 5.1 05/19/2023   CL 103 05/19/2023   CO2 23 05/19/2023   Lab Results  Component Value Date   ALT 39 (H) 05/19/2023   AST 17 05/19/2023   ALKPHOS 109 05/19/2023   BILITOT 0.4 05/19/2023   Lab Results  Component Value Date   HGBA1C 6.1 (H) 05/19/2023   HGBA1C 6.0 01/11/2023   HGBA1C 5.9 (H) 09/30/2022   HGBA1C 5.9 (H) 05/04/2022   HGBA1C 6.4 01/06/2022   Lab Results  Component Value Date   INSULIN 18.7 05/19/2023   INSULIN 14.1 09/30/2022   INSULIN 10.5 05/04/2022   INSULIN 12.4 09/08/2021   INSULIN 14.9 06/11/2021   Lab Results  Component Value Date   TSH 2.05 01/11/2023   Lab Results  Component Value Date   CHOL 206 (H) 05/19/2023   HDL 51 05/19/2023   LDLCALC 134 (H) 05/19/2023   TRIG 115 05/19/2023  CHOLHDL 5 01/11/2023   Lab Results  Component Value Date   VD25OH 50.2 09/30/2022   VD25OH 58.8 05/04/2022   VD25OH 56.0 02/04/2022   Lab Results  Component Value Date   WBC 7.8 05/19/2023   HGB 14.2 05/19/2023   HCT 43.9 05/19/2023   MCV 87 05/19/2023   PLT 435 05/19/2023   Lab Results  Component Value Date   IRON 82 01/26/2018   TIBC 391 01/26/2018   FERRITIN 35 01/26/2018   Attestation Statements:   Reviewed by clinician on day of visit: allergies, medications, problem list, medical history, surgical history, family history, social  history, and previous encounter notes.   I, Burt Knack, am acting as transcriptionist for Quillian Quince, MD.  I have reviewed the above documentation for accuracy and completeness, and I agree with the above. -  Quillian Quince, MD

## 2023-07-06 ENCOUNTER — Encounter: Payer: Self-pay | Admitting: Family Medicine

## 2023-07-06 ENCOUNTER — Ambulatory Visit (INDEPENDENT_AMBULATORY_CARE_PROVIDER_SITE_OTHER): Payer: 59 | Admitting: Family Medicine

## 2023-07-06 VITALS — BP 125/82 | HR 69 | Temp 98.2°F | Wt 325.2 lb

## 2023-07-06 DIAGNOSIS — I10 Essential (primary) hypertension: Secondary | ICD-10-CM

## 2023-07-06 DIAGNOSIS — E1169 Type 2 diabetes mellitus with other specified complication: Secondary | ICD-10-CM

## 2023-07-06 DIAGNOSIS — Z7984 Long term (current) use of oral hypoglycemic drugs: Secondary | ICD-10-CM

## 2023-07-06 DIAGNOSIS — E538 Deficiency of other specified B group vitamins: Secondary | ICD-10-CM

## 2023-07-06 DIAGNOSIS — E785 Hyperlipidemia, unspecified: Secondary | ICD-10-CM

## 2023-07-06 DIAGNOSIS — E559 Vitamin D deficiency, unspecified: Secondary | ICD-10-CM

## 2023-07-06 MED ORDER — BISOPROLOL FUMARATE 10 MG PO TABS: 10.0000 mg | ORAL_TABLET | Freq: Every day | ORAL | 1 refills | Status: DC

## 2023-07-06 NOTE — Patient Instructions (Addendum)
Return in about 6 months (around 01/12/2024) for cpe (20 min), Routine chronic condition follow-up.        Great to see you today.  I have refilled the medication(s) we provide.   If labs were collected or images ordered, we will inform you of  results once we have received them and reviewed. We will contact you either by echart message, or telephone call.  Please give ample time to the testing facility, and our office to run,  receive and review results. Please do not call inquiring of results, even if you can see them in your chart. We will contact you as soon as we are able. If it has been over 1 week since the test was completed, and you have not yet heard from [place], then please call us.    - echart message- for normal results that have been seen by the patient already.   - telephone call: abnormal results or if patient has not viewed results in their echart.  If a referral to a specialist was entered for you, please call us in 2 weeks if you have not heard from the specialist office to schedule.

## 2023-07-06 NOTE — Progress Notes (Signed)
Patient ID: Kelly Perez, female  DOB: [birth year], 57 y.o.   MRN: [id number] Patient Care Team    Relationship Specialty Notifications Start End  Natalia Leatherwood, DO PCP - General Family Medicine  07/06/21   Tressia Danas, MD (Inactive) Consulting Physician Gastroenterology  07/08/21   Conley Rolls, [place], [place] Referring Physician Optometry  07/08/21   Wilder Glade, MD Consulting Physician Bariatrics  07/08/21     Chief Complaint  Patient presents with   Hypertension    DM [facility]    Subjective: Kelly Perez is a 57 y.o.  Female  present for Chronic Conditions/illness Management All past medical history, surgical history, allergies, family history, immunizations, medications and social history were updated in the electronic medical record today. All recent labs, ED visits and hospitalizations within the last year were reviewed.  Hyperlipidemia associated with type 2 diabetes mellitus (HCC) Diabetes has been managed by her weight management team. She currently is prescribed Rybelsus 7 mg every day.  Reports she has not been able to tolerate metformin.  Essential hypertension/lipidemia/morbid obesity/family history of heart disease Pt reports compliance with bisoprolol 10 mg daily. Patient denies dizziness, hyperglycemic or hypoglycemic events. Patient denies numbness, tingling in the extremities or nonhealing wounds of feet.   Pt is not taking a daily baby ASA. Pt is not prescribed statin (declined - does not like idea of statin). She is a patient of weight management. RF: Hypertension, hyperlipidemia, obesity, diabetes, family history of heart disease    07/06/2023    2:11 PM 01/13/2023    7:48 AM 01/06/2022    8:49 AM 07/06/2021    9:56 AM 06/17/2020    8:13 AM  Depression screen PHQ 2/9  Decreased Interest 1 0 1 0 3  Down, Depressed, Hopeless 0 0 1 0 2  PHQ - 2 Score 1 0 2 0 5  Altered sleeping     3  Tired, decreased energy     3  Change in  appetite     2  Feeling bad or failure about yourself      1  Trouble concentrating     1  Moving slowly or fidgety/restless     1  Suicidal thoughts     0  PHQ-9 Score     16  Difficult doing work/chores     Somewhat difficult      01/26/2018   10:43 AM  GAD 7 : Generalized Anxiety Score  Nervous, Anxious, on Edge 1  Control/stop worrying 1  Worry too much - different things 1  Trouble relaxing 1  Restless 1  Easily annoyed or irritable 2  Afraid - awful might happen 1  Total GAD 7 Score 8  Anxiety Difficulty Somewhat difficult    Immunization History  Administered Date(s) Administered   Influenza,inj,Quad PF,6+ Mos 06/22/2018   PFIZER(Purple Top)SARS-COV-2 Vaccination 02/11/2020, 03/03/2020   PNEUMOCOCCAL CONJUGATE-20 01/06/2022   Tdap 10/25/2014    Past Medical History:  Diagnosis Date   Allergy    Anemia    Anxiety    Arthritis    Bilateral primary osteoarthritis of knee 01/26/2018   Celiac disease 01/26/2018   Chronic depression 01/26/2018   Complication of anesthesia    woke up during a few times   Constipation    Food allergy    Gallbladder problem    GERD (gastroesophageal reflux disease)    Hypertension    Morbid obesity (HCC) 01/26/2018   Osteoarthritis  PTSD (post-traumatic stress disorder)    Sleep apnea    cpap   Vitamin B12 deficiency    due to celiac   Vitamin D deficiency    Allergies  Allergen Reactions   Neosporin [Neomycin-Bacitracin Zn-Polymyx] Rash   Tetracycline Anaphylaxis   Other Other (See Comments)    Glutin celiac disease   Neomycin Rash    blistering   Past Surgical History:  Procedure Laterality Date   CHOLECYSTECTOMY  2015   COLONOSCOPY WITH PROPOFOL N/A 10/04/2022   Procedure: COLONOSCOPY WITH PROPOFOL;  Surgeon: Tressia Danas, MD;  Location: [facility];  Service: Gastroenterology;  Laterality: N/A;   POLYPECTOMY  10/04/2022   Procedure: POLYPECTOMY;  Surgeon: Tressia Danas, MD;  Location: [facility];   Service: Gastroenterology;;   SINUS IRRIGATION     Family History  Problem Relation Age of Onset   Bipolar disorder Mother    Diabetes Mother    Hypertension Mother    Heart disease Mother    Depression Mother    Obesity Mother    COPD Father    Depression Father    Early death Father    Heart disease Father    Kidney disease Father    Cardiomyopathy Father    Arthritis Father    Hypertension Father    Bipolar disorder Sister    Diabetes Brother    Heart disease Brother    Diabetes Maternal Grandmother    Bipolar disorder Maternal Grandfather    Diabetes Paternal Grandmother    Diabetes Paternal Grandfather    Hypertension Daughter    Depression Daughter    Diabetes Daughter    Rectal cancer Maternal Aunt    Stomach cancer Neg Hx    Liver disease Neg Hx    Colon cancer Neg Hx    Pancreatic cancer Neg Hx    Esophageal cancer Neg Hx    Social History   Social History Narrative   Originally from [place]; lived in [place] x 2 years prior to moving to [place] in 2018 to be near only daughter and grandkids.   Lives alone with great Dane. Divorced.    Education/employment: Masters degree.  Employed as a Warden/ranger.   Safety:      -smoke alarm in the home:Yes     - wears seatbelt: Yes     - Feels safe in their relationships: Yes       Allergies as of 07/06/2023       Reactions   Neosporin [neomycin-bacitracin Zn-polymyx] Rash   Tetracycline Anaphylaxis   Other Other (See Comments)   Glutin celiac disease   Neomycin Rash   blistering        Medication List        Accurate as of July 06, 2023  2:22 PM. If you have any questions, ask your nurse or doctor.          bisoprolol 10 MG tablet Commonly known as: ZEBETA Take 1 tablet (10 mg total) by mouth daily.   cetirizine 10 MG tablet Commonly known as: ZYRTEC Take 10 mg by mouth daily.   ezetimibe 10 MG tablet Commonly known as: Zetia Take 1 tablet (10 mg total) by mouth at bedtime.    Iron 325 (65 Fe) MG Tabs Take 650 mg by mouth daily.   Magnesium 400 MG Tabs Take 400 mg by mouth daily.   Rybelsus 7 MG Tabs Generic drug: Semaglutide Take 1 tablet (7 mg total) by mouth daily.   SUPER ENZYMES PO Take  1 tablet by mouth daily.   Turmeric 500 MG Caps Take 1,000 mg by mouth daily.   Vitamin D (Ergocalciferol) 1.25 MG (50000 UNIT) Caps capsule Commonly known as: DRISDOL Take 1 capsule (50,000 Units total) by mouth every 7 (seven) days.   Zinc 50 MG Tabs Take 50 mg by mouth daily.        All past medical history, surgical history, allergies, family history, immunizations andmedications were updated in the EMR today and reviewed under the history and medication portions of their EMR.       ROS 14 pt review of systems performed and negative (unless mentioned in an HPI)  Objective: BP 125/82   Pulse 69   Temp 98.2 F (36.8 C)   Wt (!) 325 lb 3.2 oz (147.5 kg)   LMP 02/13/2018   SpO2 94%   BMI 50.93 kg/m  Physical Exam Vitals and nursing note reviewed.  Constitutional:      General: She is not in acute distress.    Appearance: Normal appearance. She is not ill-appearing or toxic-appearing.  HENT:     Head: Normocephalic and atraumatic.     Right Ear: Tympanic membrane, ear canal and external ear normal. There is no impacted cerumen.     Left Ear: Tympanic membrane, ear canal and external ear normal. There is no impacted cerumen.     Nose: No congestion or rhinorrhea.     Mouth/Throat:     Mouth: Mucous membranes are moist.     Pharynx: Oropharynx is clear. No oropharyngeal exudate or posterior oropharyngeal erythema.  Eyes:     General: No scleral icterus.       Right eye: No discharge.        Left eye: No discharge.     Extraocular Movements: Extraocular movements intact.     Conjunctiva/sclera: Conjunctivae normal.     Pupils: Pupils are equal, round, and reactive to light.  Cardiovascular:     Rate and Rhythm: Normal rate and regular  rhythm.     Pulses: Normal pulses.     Heart sounds: Normal heart sounds. No murmur heard.    No friction rub. No gallop.  Pulmonary:     Effort: Pulmonary effort is normal. No respiratory distress.     Breath sounds: Normal breath sounds. No stridor. No wheezing, rhonchi or rales.  Chest:     Chest wall: No tenderness.  Abdominal:     General: Abdomen is flat. Bowel sounds are normal. There is no distension.     Palpations: Abdomen is soft. There is no mass.     Tenderness: There is no abdominal tenderness. There is no right CVA tenderness, left CVA tenderness, guarding or rebound.     Hernia: No hernia is present.  Musculoskeletal:        General: No swelling, tenderness or deformity. Normal range of motion.     Cervical back: Normal range of motion and neck supple. No rigidity or tenderness.     Right lower leg: No edema.     Left lower leg: No edema.  Lymphadenopathy:     Cervical: No cervical adenopathy.  Skin:    General: Skin is warm and dry.     Coloration: Skin is not jaundiced or pale.     Findings: No bruising, erythema, lesion or rash.  Neurological:     General: No focal deficit present.     Mental Status: She is alert and oriented to person, place, and time. Mental status is at  baseline.     Cranial Nerves: No cranial nerve deficit.     Sensory: No sensory deficit.     Motor: No weakness.     Coordination: Coordination normal.     Gait: Gait normal.     Deep Tendon Reflexes: Reflexes normal.  Psychiatric:        Mood and Affect: Mood normal.        Behavior: Behavior normal.        Thought Content: Thought content normal.        Judgment: Judgment normal.      No results found.  Assessment/plan: Kelly Perez is a 57 y.o. female present for Chronic Conditions/illness Management Hyperlipidemia associated with type 2 diabetes mellitus (HCC) Diabetes has been managed by her weight management team. Rybelsus 7 mg > prescribed by Dr. Dalbert Garnet A1c 6.5> 6.1> 6.2>  6.2> 6.4>5.9>5.9>6.0>6.1 (05/18/2024)  - Urine Microalbumin w/creat. Ratio UTD 12/2022 - FOOT: exam completed 07/06/2023 - [facility] requested  Essential hypertension/lipidemia/morbid obesity/family history of heart disease Stable Continue bisoprolol 10 mg daily. Discussed different options for cholesterol management.-Declines statin Continue Zetia 10 mg daily   Vitamin D deficiency Stable lab UTD Continue  supplement  Vitamin B12 deficiency Stable Continue supplement.  Lab up-to-date   Return in about 6 months (around 01/12/2024) for cpe (20 min), Routine chronic condition follow-up.  No orders of the defined types were placed in this encounter.   Meds ordered this encounter  Medications   bisoprolol (ZEBETA) 10 MG tablet    Sig: Take 1 tablet (10 mg total) by mouth daily.    Dispense:  90 tablet    Refill:  1   Referral Orders  No referral(s) requested today     Electronically signed by: Felix Pacini, DO [facility] [place]

## 2023-07-14 ENCOUNTER — Encounter (INDEPENDENT_AMBULATORY_CARE_PROVIDER_SITE_OTHER): Payer: Self-pay | Admitting: Family Medicine

## 2023-07-14 ENCOUNTER — Ambulatory Visit (INDEPENDENT_AMBULATORY_CARE_PROVIDER_SITE_OTHER): Payer: 59 | Admitting: Family Medicine

## 2023-07-14 VITALS — BP 116/75 | HR 72 | Temp 98.2°F | Ht 67.0 in | Wt 319.0 lb

## 2023-07-14 DIAGNOSIS — E1169 Type 2 diabetes mellitus with other specified complication: Secondary | ICD-10-CM

## 2023-07-14 DIAGNOSIS — E559 Vitamin D deficiency, unspecified: Secondary | ICD-10-CM

## 2023-07-14 DIAGNOSIS — K900 Celiac disease: Secondary | ICD-10-CM

## 2023-07-14 DIAGNOSIS — Z7984 Long term (current) use of oral hypoglycemic drugs: Secondary | ICD-10-CM

## 2023-07-14 DIAGNOSIS — Z6841 Body Mass Index (BMI) 40.0 and over, adult: Secondary | ICD-10-CM

## 2023-07-14 MED ORDER — VITAMIN D (ERGOCALCIFEROL) 1.25 MG (50000 UNIT) PO CAPS
50000.0000 [IU] | ORAL_CAPSULE | ORAL | 0 refills | Status: DC
Start: 2023-07-14 — End: 2023-10-06

## 2023-07-14 MED ORDER — RYBELSUS 14 MG PO TABS
1.0000 | ORAL_TABLET | Freq: Every day | ORAL | 0 refills | Status: DC
Start: 2023-07-14 — End: 2023-08-11

## 2023-07-14 NOTE — Progress Notes (Signed)
.smr  Office: [phone]  /  Fax: [phone]  WEIGHT SUMMARY AND BIOMETRICS  Anthropometric Measurements Height: 5\' 7"  (1.702 m) Weight: (!) 319 lb (144.7 kg) BMI (Calculated): 49.95 Weight at Last Visit: 319 lb Weight Lost Since Last Visit: 0 Weight Gained Since Last Visit: 0 Starting Weight: 372 lb Total Weight Loss (lbs): 55 lb (24.9 kg) Peak Weight: 372 lb   Body Composition  Body Fat %: 57.1 % Fat Mass (lbs): 182.6 lbs Muscle Mass (lbs): 130.2 lbs Total Body Water (lbs): 103.4 lbs Visceral Fat Rating : 22   Other Clinical Data Fasting: No Labs: No Today's Visit #: 32 Starting Date: 06/17/20    Chief Complaint: OBESITY   History of Present Illness   The patient, diagnosed with diabetes, vitamin D deficiency, celiac disease, and obesity, presents for a routine follow-up. She reports maintaining her weight over the past month and adhering to a low-carb diet approximately 85% of the time. She engages in regular physical activity, walking for about 40 minutes most days of the week.  The patient has been managing her celiac disease for several years and feels it is well-controlled. She reports occasional episodes of constipation and diarrhea, which she attributes to accidental gluten cross-contamination. These episodes are infrequent and unpredictable, making it difficult to identify the exact source of gluten.  The patient is on prescription vitamin D and requests a refill. She also takes Rybelsus for diabetes management, currently at a dose of 7mg , with no reported issues. She expresses willingness to increase the dose to 14mg  to potentially improve hunger control.  The patient also mentions a reliance on protein bars, which she has recently decided to limit due to their similarity to candy. She is exploring other snack options, such as cheese. Despite these dietary challenges, the patient does not feel deprived and is actively seeking ways to maintain her health.           PHYSICAL EXAM:  Blood pressure 116/75, pulse 72, temperature 98.2 F (36.8 C), height 5\' 7"  (1.702 m), weight (!) 319 lb (144.7 kg), last menstrual period 02/13/2018, SpO2 94%. Body mass index is 49.96 kg/m.  DIAGNOSTIC DATA REVIEWED:  BMET    Component Value Date/Time   NA 141 05/19/2023 0858   K 5.1 05/19/2023 0858   CL 103 05/19/2023 0858   CO2 23 05/19/2023 0858   GLUCOSE 103 (H) 05/19/2023 0858   GLUCOSE 93 01/26/2018 1052   BUN 18 05/19/2023 0858   CREATININE 0.65 05/19/2023 0858   CALCIUM 10.1 05/19/2023 0858   GFRNONAA 100 09/29/2020 1058   GFRAA 115 09/29/2020 1058   Lab Results  Component Value Date   HGBA1C 6.1 (H) 05/19/2023   HGBA1C 6.4 (H) 06/17/2020   Lab Results  Component Value Date   INSULIN 18.7 05/19/2023   INSULIN 27.4 (H) 06/17/2020   Lab Results  Component Value Date   TSH 2.05 01/11/2023   CBC    Component Value Date/Time   WBC 7.8 05/19/2023 0858   WBC 8.7 01/11/2023 0924   RBC 5.06 05/19/2023 0858   RBC 4.95 01/11/2023 0924   HGB 14.2 05/19/2023 0858   HCT 43.9 05/19/2023 0858   PLT 435 05/19/2023 0858   MCV 87 05/19/2023 0858   MCH 28.1 05/19/2023 0858   MCHC 32.3 05/19/2023 0858   MCHC 33.3 01/11/2023 0924   RDW 12.8 05/19/2023 0858   Iron Studies    Component Value Date/Time   IRON 82 01/26/2018 1052   TIBC 391 01/26/2018  1052   FERRITIN 35 01/26/2018 1052   IRONPCTSAT 21 01/26/2018 1052   Lipid Panel     Component Value Date/Time   CHOL 206 (H) 05/19/2023 0858   CHOL 239 (H) 04/14/2021 1524   TRIG 115 05/19/2023 0858   TRIG 157 (H) 04/14/2021 1524   HDL 51 05/19/2023 0858   CHOLHDL 5 01/11/2023 0924   VLDL 32.4 01/11/2023 0924   LDLCALC 134 (H) 05/19/2023 0858   Hepatic Function Panel     Component Value Date/Time   PROT 7.6 05/19/2023 0858   ALBUMIN 4.6 05/19/2023 0858   AST 17 05/19/2023 0858   ALT 39 (H) 05/19/2023 0858   ALKPHOS 109 05/19/2023 0858   BILITOT 0.4 05/19/2023 0858       Component Value Date/Time   TSH 2.05 01/11/2023 0924   Nutritional Lab Results  Component Value Date   VD25OH 50.2 09/30/2022   VD25OH 58.8 05/04/2022   VD25OH 56.0 02/04/2022     Assessment and Plan    Obesity and Diabetes Maintained weight over the last month. Following a low carb plan approximately 85% of the time and walking for exercise about 40 minutes most days of the week. -Continue current low carb diet and exercise regimen. -Consider increasing Rybelsus to 14mg  daily for better hunger control. If not tolerated, revert back to 7mg  daily.  Vitamin D Deficiency On prescription vitamin D and requests a refill. -Refill prescription for vitamin D.  Celiac Disease Generally well controlled. Recent episode of constipation and diarrhea, possibly due to gluten cross-contamination. -Continue gluten-free diet.  Follow-up in 4 weeks on August 11, 2023. Schedule another appointment for November.        She was informed of the importance of frequent follow up visits to maximize her success with intensive lifestyle modifications for her multiple health conditions.    Quillian Quince, MD

## 2023-08-11 ENCOUNTER — Ambulatory Visit (INDEPENDENT_AMBULATORY_CARE_PROVIDER_SITE_OTHER): Payer: 59 | Admitting: Family Medicine

## 2023-08-11 ENCOUNTER — Encounter (INDEPENDENT_AMBULATORY_CARE_PROVIDER_SITE_OTHER): Payer: Self-pay | Admitting: Family Medicine

## 2023-08-11 ENCOUNTER — Other Ambulatory Visit (INDEPENDENT_AMBULATORY_CARE_PROVIDER_SITE_OTHER): Payer: Self-pay | Admitting: Family Medicine

## 2023-08-11 VITALS — BP 134/83 | HR 88 | Temp 98.1°F | Ht 67.0 in | Wt 320.0 lb

## 2023-08-11 DIAGNOSIS — Z6841 Body Mass Index (BMI) 40.0 and over, adult: Secondary | ICD-10-CM

## 2023-08-11 DIAGNOSIS — E669 Obesity, unspecified: Secondary | ICD-10-CM | POA: Diagnosis not present

## 2023-08-11 DIAGNOSIS — E559 Vitamin D deficiency, unspecified: Secondary | ICD-10-CM

## 2023-08-11 DIAGNOSIS — E1169 Type 2 diabetes mellitus with other specified complication: Secondary | ICD-10-CM

## 2023-08-11 DIAGNOSIS — Z7984 Long term (current) use of oral hypoglycemic drugs: Secondary | ICD-10-CM

## 2023-08-11 MED ORDER — RYBELSUS 14 MG PO TABS
1.0000 | ORAL_TABLET | Freq: Every day | ORAL | 0 refills | Status: DC
Start: 2023-08-11 — End: 2023-08-15

## 2023-08-11 NOTE — Progress Notes (Signed)
.smr  Office: [phone]  /  Fax: [phone]  WEIGHT SUMMARY AND BIOMETRICS  Anthropometric Measurements Height: 5\' 7"  (1.702 m) Weight: (!) 320 lb (145.2 kg) BMI (Calculated): 50.11 Weight at Last Visit: 319 lb Weight Lost Since Last Visit: 0 Weight Gained Since Last Visit: 1 lb Starting Weight: 372 lb Total Weight Loss (lbs): 54 lb (24.5 kg) Peak Weight: 372 lb   Body Composition  Body Fat %: 57.6 % Fat Mass (lbs): 184.6 lbs Muscle Mass (lbs): 129 lbs Visceral Fat Rating : 22   Other Clinical Data Fasting: No Labs: No Today's Visit #: 44 Starting Date: 06/17/20    Chief Complaint: OBESITY  History of Present Illness   The patient, diagnosed with obesity, type two diabetes, and vitamin D deficiency, reports a weight gain of one pound over the past month. She has been adhering to a low-carb diet approximately 85% of the time and has been engaging in regular physical activity, walking for 30-40 minutes daily.  The patient has been on a medication regimen that includes Rybelsus, which she reports has significantly reduced her appetite and snacking habits. She has experienced minimal side effects, with only one day of nausea reported since the dose change. The patient has adapted well to the medication's timing requirements, taking it on an empty stomach and waiting 30 minutes before eating.  The patient also has a history of low B12 levels, which previously required injections. However, she reports that her B12 levels have normalized without the need for injections. She attributes this improvement to a switch from pharmaceuticals to natural supplements, including slippery elm and marshmallow root.  The patient has also been managing a vitamin D deficiency. The last vitamin D level check was around Christmas of the previous year, and the results were within the normal range. The patient has been taking vitamin D supplements, but there is a concern about potential  overdosing.  The patient has a history of celiac disease, which has previously caused issues with nutrient absorption. However, she reports that her overall health has improved, as evidenced by her hair growth after donating it a year ago.  The patient works from home and has been managing work-related stress by setting boundaries to prevent overworking. She has designated a specific room in her house for work and makes a point to leave the room when she is done for the day. Despite the busy period at work, the patient does not anticipate any work-related travel until Christmas.          PHYSICAL EXAM:  Blood pressure 134/83, pulse 88, temperature 98.1 F (36.7 C), height 5\' 7"  (1.702 m), weight (!) 320 lb (145.2 kg), last menstrual period 02/13/2018, SpO2 93%. Body mass index is 50.12 kg/m.  DIAGNOSTIC DATA REVIEWED:  BMET    Component Value Date/Time   NA 141 05/19/2023 0858   K 5.1 05/19/2023 0858   CL 103 05/19/2023 0858   CO2 23 05/19/2023 0858   GLUCOSE 103 (H) 05/19/2023 0858   GLUCOSE 93 01/26/2018 1052   BUN 18 05/19/2023 0858   CREATININE 0.65 05/19/2023 0858   CALCIUM 10.1 05/19/2023 0858   GFRNONAA 100 09/29/2020 1058   GFRAA 115 09/29/2020 1058   Lab Results  Component Value Date   HGBA1C 6.1 (H) 05/19/2023   HGBA1C 6.4 (H) 06/17/2020   Lab Results  Component Value Date   INSULIN 18.7 05/19/2023   INSULIN 27.4 (H) 06/17/2020   Lab Results  Component Value Date   TSH 2.05  01/11/2023   CBC    Component Value Date/Time   WBC 7.8 05/19/2023 0858   WBC 8.7 01/11/2023 0924   RBC 5.06 05/19/2023 0858   RBC 4.95 01/11/2023 0924   HGB 14.2 05/19/2023 0858   HCT 43.9 05/19/2023 0858   PLT 435 05/19/2023 0858   MCV 87 05/19/2023 0858   MCH 28.1 05/19/2023 0858   MCHC 32.3 05/19/2023 0858   MCHC 33.3 01/11/2023 0924   RDW 12.8 05/19/2023 0858   Iron Studies    Component Value Date/Time   IRON 82 01/26/2018 1052   TIBC 391 01/26/2018 1052   FERRITIN  35 01/26/2018 1052   IRONPCTSAT 21 01/26/2018 1052   Lipid Panel     Component Value Date/Time   CHOL 206 (H) 05/19/2023 0858   CHOL 239 (H) 04/14/2021 1524   TRIG 115 05/19/2023 0858   TRIG 157 (H) 04/14/2021 1524   HDL 51 05/19/2023 0858   CHOLHDL 5 01/11/2023 0924   VLDL 32.4 01/11/2023 0924   LDLCALC 134 (H) 05/19/2023 0858   Hepatic Function Panel     Component Value Date/Time   PROT 7.6 05/19/2023 0858   ALBUMIN 4.6 05/19/2023 0858   AST 17 05/19/2023 0858   ALT 39 (H) 05/19/2023 0858   ALKPHOS 109 05/19/2023 0858   BILITOT 0.4 05/19/2023 0858      Component Value Date/Time   TSH 2.05 01/11/2023 0924   Nutritional Lab Results  Component Value Date   VD25OH 50.2 09/30/2022   VD25OH 58.8 05/04/2022   VD25OH 56.0 02/04/2022     Assessment and Plan    Obesity and Type 2 Diabetes Patient reports adherence to low carb diet approximately 85% of the time and daily exercise. Noted improvement in satiety and reduced snacking since increasing Rybelsus to 14mg . No significant side effects reported. -Continue Rybelsus 14mg  daily. -Continue low carb diet and daily exercise. -Refill Rybelsus for 90 days.  Vitamin D Deficiency Last checked approximately 10 months ago and was within normal range. Patient continues to take Vitamin D supplement. -Check Vitamin D level today to ensure not exceeding therapeutic range. -Continue Vitamin D supplement pending lab results.  General Health Maintenance / Followup Plans -Plan for comprehensive lab work prior to December appointment. -Next appointment scheduled for November.        She was informed of the importance of frequent follow up visits to maximize her success with intensive lifestyle modifications for her multiple health conditions.    Quillian Quince, MD

## 2023-08-12 LAB — VITAMIN D 25 HYDROXY (VIT D DEFICIENCY, FRACTURES): Vit D, 25-Hydroxy: 60.1 ng/mL (ref 30.0–100.0)

## 2023-08-15 ENCOUNTER — Other Ambulatory Visit (INDEPENDENT_AMBULATORY_CARE_PROVIDER_SITE_OTHER): Payer: Self-pay | Admitting: Family Medicine

## 2023-08-15 DIAGNOSIS — E1169 Type 2 diabetes mellitus with other specified complication: Secondary | ICD-10-CM

## 2023-08-16 ENCOUNTER — Encounter (INDEPENDENT_AMBULATORY_CARE_PROVIDER_SITE_OTHER): Payer: Self-pay | Admitting: Family Medicine

## 2023-08-16 MED ORDER — RYBELSUS 14 MG PO TABS
1.0000 | ORAL_TABLET | Freq: Every day | ORAL | 0 refills | Status: DC
Start: 2023-08-16 — End: 2023-10-06

## 2023-08-16 NOTE — Telephone Encounter (Signed)
LAST APPOINTMENT DATE: 08/11/2023 NEXT APPOINTMENT DATE: 09/08/2023   [facility] - [place], [place] - [address] [place] [address] Phone: [phone] Fax: [phone]  [facility] [phone] - [place], [place] - [address] [place] [address] Phone: [phone] Fax: [phone]  Patient is requesting a refill of the following medications: Requested Prescriptions   Pending Prescriptions Disp Refills   Semaglutide (RYBELSUS) 14 MG TABS 90 tablet 0    Sig: Take 1 tablet (14 mg total) by mouth daily.    Date last filled: 08/11/2023, but needs 90 day supply per her insurance Previously prescribed by Dr Dalbert Garnet  Lab Results  Component Value Date   HGBA1C 6.1 (H) 05/19/2023   HGBA1C 6.0 01/11/2023   HGBA1C 5.9 (H) 09/30/2022   Lab Results  Component Value Date   MICROALBUR <0.7 01/11/2023   LDLCALC 134 (H) 05/19/2023   CREATININE 0.65 05/19/2023   Lab Results  Component Value Date   VD25OH 60.1 08/11/2023   VD25OH 50.2 09/30/2022   VD25OH 58.8 05/04/2022    BP Readings from Last 3 Encounters:  08/11/23 134/83  07/14/23 116/75  07/06/23 125/82

## 2023-09-08 ENCOUNTER — Encounter (INDEPENDENT_AMBULATORY_CARE_PROVIDER_SITE_OTHER): Payer: Self-pay | Admitting: Family Medicine

## 2023-09-08 ENCOUNTER — Ambulatory Visit (INDEPENDENT_AMBULATORY_CARE_PROVIDER_SITE_OTHER): Payer: 59 | Admitting: Family Medicine

## 2023-09-08 VITALS — BP 161/95 | HR 65 | Temp 97.8°F | Ht 67.0 in | Wt 320.0 lb

## 2023-09-08 DIAGNOSIS — R030 Elevated blood-pressure reading, without diagnosis of hypertension: Secondary | ICD-10-CM | POA: Insufficient documentation

## 2023-09-08 DIAGNOSIS — E1169 Type 2 diabetes mellitus with other specified complication: Secondary | ICD-10-CM

## 2023-09-08 DIAGNOSIS — K900 Celiac disease: Secondary | ICD-10-CM

## 2023-09-08 DIAGNOSIS — Z6841 Body Mass Index (BMI) 40.0 and over, adult: Secondary | ICD-10-CM

## 2023-09-08 DIAGNOSIS — E669 Obesity, unspecified: Secondary | ICD-10-CM

## 2023-09-08 DIAGNOSIS — E559 Vitamin D deficiency, unspecified: Secondary | ICD-10-CM

## 2023-09-08 DIAGNOSIS — Z7984 Long term (current) use of oral hypoglycemic drugs: Secondary | ICD-10-CM

## 2023-09-08 DIAGNOSIS — E785 Hyperlipidemia, unspecified: Secondary | ICD-10-CM | POA: Diagnosis not present

## 2023-09-08 NOTE — Progress Notes (Signed)
.smr  Office: [phone]  /  Fax: [phone]  WEIGHT SUMMARY AND BIOMETRICS  Anthropometric Measurements Height: 5\' 7"  (1.702 m) Weight: (!) 320 lb (145.2 kg) BMI (Calculated): 50.11 Weight at Last Visit: 320 lb Weight Lost Since Last Visit: 0 Weight Gained Since Last Visit: 0 Starting Weight: 372 lb Total Weight Loss (lbs): 54 lb (24.5 kg) Peak Weight: 372 lb   Body Composition  Body Fat %: 57.2 % Fat Mass (lbs): 183.4 lbs Muscle Mass (lbs): 130.4 lbs Total Body Water (lbs): 103.2 lbs Visceral Fat Rating : 22   Other Clinical Data Fasting: No Labs: No Today's Visit #: 45 Starting Date: 06/17/20    Chief Complaint: OBESITY    History of Present Illness   The patient, with a history of type 2 diabetes, vitamin D deficiency, obesity, and celiac disease, presents for a routine follow-up. She has been maintaining her weight over the past month by adhering to a lower carbohydrate diet approximately 80% of the time and engaging in regular physical activity, such as walking her dog for about 30 minutes most days of the week. She has been gluten-free for a significant period due to her celiac disease.  The patient has been managing her diabetes with Rybelsus and has not reported any gastrointestinal side effects. She has also been taking prescription vitamin D for her deficiency. Recently, she has noticed an increase in constipation, which she attributes to the Rybelsus. She has been managing this with dietary changes, such as consuming prune bites.  The patient has been under some stress recently due to a family health crisis. Her son-in-law, who is 62 years old and generally healthy, recently had a heart attack and required two stents. This event has caused some concern for the patient, particularly regarding the genetic implications for her grandsons.  The patient's blood pressure was slightly elevated at this visit, which she attributes to the recent stress.           PHYSICAL EXAM:  Blood pressure (!) 161/95, pulse 65, temperature 97.8 F (36.6 C), height 5\' 7"  (1.702 m), weight (!) 320 lb (145.2 kg), last menstrual period 02/13/2018, SpO2 97%. Body mass index is 50.12 kg/m.  DIAGNOSTIC DATA REVIEWED:  BMET    Component Value Date/Time   NA 141 05/19/2023 0858   K 5.1 05/19/2023 0858   CL 103 05/19/2023 0858   CO2 23 05/19/2023 0858   GLUCOSE 103 (H) 05/19/2023 0858   GLUCOSE 93 01/26/2018 1052   BUN 18 05/19/2023 0858   CREATININE 0.65 05/19/2023 0858   CALCIUM 10.1 05/19/2023 0858   GFRNONAA 100 09/29/2020 1058   GFRAA 115 09/29/2020 1058   Lab Results  Component Value Date   HGBA1C 6.1 (H) 05/19/2023   HGBA1C 6.4 (H) 06/17/2020   Lab Results  Component Value Date   INSULIN 18.7 05/19/2023   INSULIN 27.4 (H) 06/17/2020   Lab Results  Component Value Date   TSH 2.05 01/11/2023   CBC    Component Value Date/Time   WBC 7.8 05/19/2023 0858   WBC 8.7 01/11/2023 0924   RBC 5.06 05/19/2023 0858   RBC 4.95 01/11/2023 0924   HGB 14.2 05/19/2023 0858   HCT 43.9 05/19/2023 0858   PLT 435 05/19/2023 0858   MCV 87 05/19/2023 0858   MCH 28.1 05/19/2023 0858   MCHC 32.3 05/19/2023 0858   MCHC 33.3 01/11/2023 0924   RDW 12.8 05/19/2023 0858   Iron Studies    Component Value Date/Time   IRON  82 01/26/2018 1052   TIBC 391 01/26/2018 1052   FERRITIN 35 01/26/2018 1052   IRONPCTSAT 21 01/26/2018 1052   Lipid Panel     Component Value Date/Time   CHOL 206 (H) 05/19/2023 0858   CHOL 239 (H) 04/14/2021 1524   TRIG 115 05/19/2023 0858   TRIG 157 (H) 04/14/2021 1524   HDL 51 05/19/2023 0858   CHOLHDL 5 01/11/2023 0924   VLDL 32.4 01/11/2023 0924   LDLCALC 134 (H) 05/19/2023 0858   Hepatic Function Panel     Component Value Date/Time   PROT 7.6 05/19/2023 0858   ALBUMIN 4.6 05/19/2023 0858   AST 17 05/19/2023 0858   ALT 39 (H) 05/19/2023 0858   ALKPHOS 109 05/19/2023 0858   BILITOT 0.4 05/19/2023 0858      Component  Value Date/Time   TSH 2.05 01/11/2023 0924   Nutritional Lab Results  Component Value Date   VD25OH 60.1 08/11/2023   VD25OH 50.2 09/30/2022   VD25OH 58.8 05/04/2022     Assessment and Plan    Type 2 Diabetes Mellitus Type 2 diabetes mellitus, managed with Rybelsus 14 mg. No gastrointestinal issues reported. Adheres to a lower carb diet (~80%) and walks daily (~30 min). Weight stable with decreased fat mass. Prefers Rybelsus over metformin due to better tolerance and fewer gastrointestinal side effects. - Continue Rybelsus 14 mg - Check A1c and insulin levels - Encourage current diet and exercise regimen  Obesity Obesity, weight well-managed. Adheres to a lower carb diet and regular physical activity. Discussed benefits of weight maintenance and gradual weight loss for overall health and diabetes management. - Encourage current diet and exercise regimen  Constipation Constipation, likely secondary to Rybelsus. Occasional constipation managed with prune bites. Discussed dietary adjustments to mitigate constipation. - Monitor for worsening constipation - Consider dietary adjustments if needed  Intermittent Elevated Blood Pressure Intermittent elevated blood pressure, no hypertension diagnosis. Recent elevated reading, previous readings normal. Stress may contribute. Discussed importance of monitoring and stress management. - Monitor blood pressure at home - Send MyChart message if readings consistently >140/90 mmHg  Vitamin D Deficiency Vitamin D deficiency, managed with prescription vitamin D. Recently obtained 90-day supply. Discussed importance of maintaining adequate levels for bone health and overall well-being. - Continue vitamin D 50,000 IU weekly - Check vitamin D level  Celiac Disease Celiac disease, managed with a gluten-free diet. Strict adherence discussed to prevent complications. - Continue gluten-free diet  General Health Maintenance Routine health maintenance  discussed, including regular check-ups and lab work. - Order cholesterol test - Schedule blood work 3-4 days before next appointment  Follow-up - Schedule January appointment - Ensure lab results are available for discussion at next visit.          She was informed of the importance of frequent follow up visits to maximize her success with intensive lifestyle modifications for her multiple health conditions.    Quillian Quince, MD

## 2023-09-30 LAB — LIPID PANEL WITH LDL/HDL RATIO
Cholesterol, Total: 198 mg/dL (ref 100–199)
HDL: 47 mg/dL (ref >39)
LDL Chol Calc (NIH): 132 mg/dL — ABNORMAL HIGH (ref 0–99)
LDL/HDL Ratio: 2.8 {ratio} (ref 0.0–3.2)
Triglycerides: 106 mg/dL (ref 0–149)
VLDL Cholesterol Cal: 19 mg/dL (ref 5–40)

## 2023-09-30 LAB — CMP14+EGFR
ALT: 27 [IU]/L (ref 0–32)
AST: 14 [IU]/L (ref 0–40)
Albumin: 4.3 g/dL (ref 3.8–4.9)
Alkaline Phosphatase: 117 [IU]/L (ref 44–121)
BUN/Creatinine Ratio: 26 — ABNORMAL HIGH (ref 9–23)
BUN: 16 mg/dL (ref 6–24)
Bilirubin Total: 0.2 mg/dL (ref 0.0–1.2)
CO2: 24 mmol/L (ref 20–29)
Calcium: 9.3 mg/dL (ref 8.7–10.2)
Chloride: 102 mmol/L (ref 96–106)
Creatinine, Ser: 0.62 mg/dL (ref 0.57–1.00)
Globulin, Total: 2.6 g/dL (ref 1.5–4.5)
Glucose: 99 mg/dL (ref 70–99)
Potassium: 5.1 mmol/L (ref 3.5–5.2)
Sodium: 138 mmol/L (ref 134–144)
Total Protein: 6.9 g/dL (ref 6.0–8.5)
eGFR: 103 mL/min/{1.73_m2} (ref >59)

## 2023-09-30 LAB — VITAMIN B12: Vitamin B-12: 326 pg/mL (ref 232–1245)

## 2023-09-30 LAB — INSULIN, RANDOM: INSULIN: 21.3 u[IU]/mL (ref 2.6–24.9)

## 2023-09-30 LAB — HEMOGLOBIN A1C
Est. average glucose Bld gHb Est-mCnc: 126 mg/dL
Hgb A1c MFr Bld: 6 % — ABNORMAL HIGH (ref 4.8–5.6)

## 2023-09-30 LAB — VITAMIN D 25 HYDROXY (VIT D DEFICIENCY, FRACTURES): Vit D, 25-Hydroxy: 60.6 ng/mL (ref 30.0–100.0)

## 2023-09-30 LAB — TSH: TSH: 2.84 u[IU]/mL (ref 0.450–4.500)

## 2023-10-06 ENCOUNTER — Encounter (INDEPENDENT_AMBULATORY_CARE_PROVIDER_SITE_OTHER): Payer: Self-pay | Admitting: Family Medicine

## 2023-10-06 ENCOUNTER — Ambulatory Visit (INDEPENDENT_AMBULATORY_CARE_PROVIDER_SITE_OTHER): Payer: 59 | Admitting: Family Medicine

## 2023-10-06 VITALS — BP 132/78 | HR 76 | Temp 98.1°F | Ht 67.0 in | Wt 322.0 lb

## 2023-10-06 DIAGNOSIS — E559 Vitamin D deficiency, unspecified: Secondary | ICD-10-CM

## 2023-10-06 DIAGNOSIS — E669 Obesity, unspecified: Secondary | ICD-10-CM

## 2023-10-06 DIAGNOSIS — E1169 Type 2 diabetes mellitus with other specified complication: Secondary | ICD-10-CM

## 2023-10-06 DIAGNOSIS — E119 Type 2 diabetes mellitus without complications: Secondary | ICD-10-CM

## 2023-10-06 DIAGNOSIS — K900 Celiac disease: Secondary | ICD-10-CM

## 2023-10-06 DIAGNOSIS — E785 Hyperlipidemia, unspecified: Secondary | ICD-10-CM | POA: Diagnosis not present

## 2023-10-06 DIAGNOSIS — Z6841 Body Mass Index (BMI) 40.0 and over, adult: Secondary | ICD-10-CM

## 2023-10-06 DIAGNOSIS — Z7984 Long term (current) use of oral hypoglycemic drugs: Secondary | ICD-10-CM

## 2023-10-06 DIAGNOSIS — E538 Deficiency of other specified B group vitamins: Secondary | ICD-10-CM

## 2023-10-06 DIAGNOSIS — E7849 Other hyperlipidemia: Secondary | ICD-10-CM

## 2023-10-06 MED ORDER — RYBELSUS 14 MG PO TABS: 1.0000 | ORAL_TABLET | Freq: Every day | ORAL | 0 refills | Status: DC

## 2023-10-06 MED ORDER — VITAMIN D (ERGOCALCIFEROL) 1.25 MG (50000 UNIT) PO CAPS: 50000.0000 [IU] | ORAL_CAPSULE | ORAL | 0 refills | Status: DC

## 2023-10-06 NOTE — Progress Notes (Signed)
.smr  Office: [phone]  /  Fax: [phone]  WEIGHT SUMMARY AND BIOMETRICS  Anthropometric Measurements Height: 5\' 7"  (1.702 m) Weight: (!) 322 lb (146.1 kg) BMI (Calculated): 50.42 Weight at Last Visit: 320 lb Weight Lost Since Last Visit: 0 Weight Gained Since Last Visit: 2 lb Starting Weight: 372 lb Total Weight Loss (lbs): 52 lb (23.6 kg) Peak Weight: 372 lb   Body Composition  Body Fat %: 57.4 % Fat Mass (lbs): 185.2 lbs Muscle Mass (lbs): 130.4 lbs Total Body Water (lbs): 102.6 lbs Visceral Fat Rating : 22   Other Clinical Data Fasting: No Labs: No Today's Visit #: 63 Starting Date: 06/17/20    Chief Complaint: OBESITY  History of Present Illness   The patient, with a history of type 2 diabetes, obesity, celiac disease, and vitamin D deficiency, presents for a routine follow-up. She reports good control of her diabetes with Rybelsus, as evidenced by a recent HbA1c of 6.0. She has been following a low carbohydrate diet, which she adheres to approximately 85% of the time, and has been walking for exercise 20 minutes daily. She has lost two pounds in the last month.  The patient also reports some constipation, which she attributes to the Rybelsus. She has been managing this with prunes and a gluten-free, chewable medication. She has been hydrating well.  The patient has a history of vitamin D deficiency, which is currently well controlled with prescription vitamin D 50,000 IU. Her most recent vitamin D level was 60. She also has a history of B12 deficiency, which was previously managed with monthly injections. She has not been taking a multivitamin with B12 recently, and her B12 levels have been dropping. She reports feeling a bit "draggy" lately.  The patient also has a history of elevated LDL, which she attributes to her diet and possibly genetics. Her LDL levels have remained steady. She has a history of an elevated liver enzyme, which has since normalized. Her  fasting glucose was recently 99, which is the best it has been in a while.  The patient is also managing her health with lifestyle modifications, including a low carbohydrate diet and regular exercise. She reports some stress related to work, but expects this to decrease after the holidays. She plans to travel to [facility] for Christmas.          PHYSICAL EXAM:  Blood pressure 132/78, pulse 76, temperature 98.1 F (36.7 C), height 5\' 7"  (1.702 m), weight (!) 322 lb (146.1 kg), last menstrual period 02/13/2018, SpO2 96%. Body mass index is 50.43 kg/m.  DIAGNOSTIC DATA REVIEWED:  BMET    Component Value Date/Time   NA 138 09/29/2023 0810   K 5.1 09/29/2023 0810   CL 102 09/29/2023 0810   CO2 24 09/29/2023 0810   GLUCOSE 99 09/29/2023 0810   GLUCOSE 93 01/26/2018 1052   BUN 16 09/29/2023 0810   CREATININE 0.62 09/29/2023 0810   CALCIUM 9.3 09/29/2023 0810   GFRNONAA 100 09/29/2020 1058   GFRAA 115 09/29/2020 1058   Lab Results  Component Value Date   HGBA1C 6.0 (H) 09/29/2023   HGBA1C 6.4 (H) 06/17/2020   Lab Results  Component Value Date   INSULIN 21.3 09/29/2023   INSULIN 27.4 (H) 06/17/2020   Lab Results  Component Value Date   TSH 2.840 09/29/2023   CBC    Component Value Date/Time   WBC 7.8 05/19/2023 0858   WBC 8.7 01/11/2023 0924   RBC 5.06 05/19/2023 0858   RBC  4.95 01/11/2023 0924   HGB 14.2 05/19/2023 0858   HCT 43.9 05/19/2023 0858   PLT 435 05/19/2023 0858   MCV 87 05/19/2023 0858   MCH 28.1 05/19/2023 0858   MCHC 32.3 05/19/2023 0858   MCHC 33.3 01/11/2023 0924   RDW 12.8 05/19/2023 0858   Iron Studies    Component Value Date/Time   IRON 82 01/26/2018 1052   TIBC 391 01/26/2018 1052   FERRITIN 35 01/26/2018 1052   IRONPCTSAT 21 01/26/2018 1052   Lipid Panel     Component Value Date/Time   CHOL 198 09/29/2023 0810   CHOL 239 (H) 04/14/2021 1524   TRIG 106 09/29/2023 0810   TRIG 157 (H) 04/14/2021 1524   HDL 47 09/29/2023 0810    CHOLHDL 5 01/11/2023 0924   VLDL 32.4 01/11/2023 0924   LDLCALC 132 (H) 09/29/2023 0810   Hepatic Function Panel     Component Value Date/Time   PROT 6.9 09/29/2023 0810   ALBUMIN 4.3 09/29/2023 0810   AST 14 09/29/2023 0810   ALT 27 09/29/2023 0810   ALKPHOS 117 09/29/2023 0810   BILITOT 0.2 09/29/2023 0810      Component Value Date/Time   TSH 2.840 09/29/2023 0810   Nutritional Lab Results  Component Value Date   VD25OH 60.6 09/29/2023   VD25OH 60.1 08/11/2023   VD25OH 50.2 09/30/2022     Assessment and Plan    Type 2 Diabetes Mellitus Well-controlled with Rybelsus. Hemoglobin A1c is 6.0. Mild constipation managed with prunes and gluten-free kid's chewable laxatives. Emphasized the importance of continuing the current regimen. - Prescribe 90-day supply of Rybelsus - Continue current dietary and exercise regimen  Obesity Lost 2 pounds in the last month. Following a low carbohydrate diet and walking 20 minutes daily. Goal to lose 5 more pounds by the end of spring. Discussed benefits of weight loss on overall health and diabetes management. - Continue low carbohydrate diet - Continue walking 20 minutes daily  Hyperlipidemia LDL remains elevated, likely due to genetic factors. Dietary changes have improved other lipid parameters but not LDL. - Continue monitoring lipid levels  Celiac Disease Managing well with a low carbohydrate diet. No new symptoms reported. - Continue current dietary regimen  Vitamin D Deficiency Well-controlled with prescription vitamin D 50,000 IU. Most recent level was 60. Emphasized the importance of maintaining current supplementation. - Prescribe 90-day supply of vitamin D 50,000 IU  Vitamin B12 Deficiency B12 levels have dropped significantly. Previously required monthly injections but has not been on supplementation recently. Discussed the need to resume B12 supplementation. - Resume B12 supplementation with lingual liquid B12  General  Health Maintenance Hydration status improving but not optimal. Kidneys, liver, and electrolytes are normal. Thyroid function is normal. Fasting glucose was 99. Discussed the importance of continued hydration. - Continue to improve hydration  Follow-up - Schedule follow-up appointment for February 2025.        She was informed of the importance of frequent follow up visits to maximize her success with intensive lifestyle modifications for her multiple health conditions.    Quillian Quince, MD

## 2023-10-23 ENCOUNTER — Telehealth: Payer: 59 | Admitting: Family Medicine

## 2023-10-23 DIAGNOSIS — J019 Acute sinusitis, unspecified: Secondary | ICD-10-CM

## 2023-10-23 DIAGNOSIS — B9689 Other specified bacterial agents as the cause of diseases classified elsewhere: Secondary | ICD-10-CM

## 2023-10-23 MED ORDER — CEFDINIR 300 MG PO CAPS: 300.0000 mg | ORAL_CAPSULE | Freq: Two times a day (BID) | ORAL | 0 refills | Status: AC

## 2023-10-23 NOTE — Patient Instructions (Signed)

## 2023-10-23 NOTE — Progress Notes (Signed)
Virtual Visit Consent   Kelly Perez, you are scheduled for a virtual visit with a [facility] provider today. Just as with appointments in the office, your consent must be obtained to participate. Your consent will be active for this visit and any virtual visit you may have with one of our providers in the next 365 days. If you have a MyChart account, a copy of this consent can be sent to you electronically.  As this is a virtual visit, video technology does not allow for your provider to perform a traditional examination. This may limit your provider's ability to fully assess your condition. If your provider identifies any concerns that need to be evaluated in person or the need to arrange testing (such as labs, EKG, etc.), we will make arrangements to do so. Although advances in technology are sophisticated, we cannot ensure that it will always work on either your end or our end. If the connection with a video visit is poor, the visit may have to be switched to a telephone visit. With either a video or telephone visit, we are not always able to ensure that we have a secure connection.  By engaging in this virtual visit, you consent to the provision of healthcare and authorize for your insurance to be billed (if applicable) for the services provided during this visit. Depending on your insurance coverage, you may receive a charge related to this service.  I need to obtain your verbal consent now. Are you willing to proceed with your visit today? Kelly Perez has provided verbal consent on 10/23/2023 for a virtual visit (video or telephone). Georgana Curio, FNP  Date: 10/23/2023 1:49 PM  Virtual Visit via Video Note   I, Georgana Curio, connected with  Kelly Perez  ([id number], [birth year]) on 10/23/23 at  2:00 PM EST by a video-enabled telemedicine application and verified that I am speaking with the correct person using two identifiers.  Location: Patient: Virtual Visit Location Patient:  Home Provider: Virtual Visit Location Provider: Home Office   I discussed the limitations of evaluation and management by telemedicine and the availability of in person appointments. The patient expressed understanding and agreed to proceed.    History of Present Illness: Kelly Perez is a 57 y.o. who identifies as a female who was assigned female at birth, and is being seen today for sinus pressure and pain for 6-7 days worsening since flying on a trip this week yellow mucus. Worse left maxillary sinus. No fever. She has a history of celiac. Marland Kitchen  HPI: HPI  Problems:  Patient Active Problem List   Diagnosis Date Noted   Elevated blood-pressure reading without diagnosis of hypertension 09/08/2023   BMI 45.0-49.9, adult (HCC) 12/23/2022   BMI 50.0-59.9, adult (HCC) 11/25/2022   Adenomatous polyp of ascending colon 10/04/2022   Adenomatous polyp of sigmoid colon 10/04/2022   Telangiectasia 06/11/2022   Insulin resistance 03/30/2022   At risk for impaired metabolic function 03/30/2022   Diabetes mellitus (HCC) 03/08/2022   Sleep apnea 07/08/2021   Morbid obesity (HCC) 07/06/2021   Family history of heart disease 07/06/2021   Other hyperlipidemia 10/14/2020   Transaminitis 10/14/2020   Celiac disease 01/26/2018   Essential hypertension 01/26/2018   Bilateral primary osteoarthritis of knee 01/26/2018   Vitamin B12 deficiency    Vitamin D deficiency     Allergies:  Allergies  Allergen Reactions   Neosporin [Neomycin-Bacitracin Zn-Polymyx] Rash   Tetracycline Anaphylaxis   Other Other (See Comments)    Glutin celiac disease  Neomycin Rash    blistering   Medications:  Current Outpatient Medications:    cefdinir (OMNICEF) 300 MG capsule, Take 1 capsule (300 mg total) by mouth 2 (two) times daily for 10 days., Disp: 20 capsule, Rfl: 0   bisoprolol (ZEBETA) 10 MG tablet, Take 1 tablet (10 mg total) by mouth daily., Disp: 90 tablet, Rfl: 1   cetirizine (ZYRTEC) 10 MG tablet, Take  10 mg by mouth daily., Disp: , Rfl:    Digestive Enzymes (SUPER ENZYMES PO), Take 1 tablet by mouth daily., Disp: , Rfl:    ezetimibe (ZETIA) 10 MG tablet, Take 1 tablet (10 mg total) by mouth at bedtime., Disp: 90 tablet, Rfl: 3   Ferrous Sulfate (IRON) 325 (65 Fe) MG TABS, Take 650 mg by mouth daily., Disp: , Rfl:    Magnesium 400 MG TABS, Take 400 mg by mouth daily., Disp: , Rfl:    Semaglutide (RYBELSUS) 14 MG TABS, Take 1 tablet (14 mg total) by mouth daily., Disp: 90 tablet, Rfl: 0   Turmeric 500 MG CAPS, Take 1,000 mg by mouth daily., Disp: , Rfl:    Vitamin D, Ergocalciferol, (DRISDOL) 1.25 MG (50000 UNIT) CAPS capsule, Take 1 capsule (50,000 Units total) by mouth every 7 (seven) days., Disp: 13 capsule, Rfl: 0   Zinc 50 MG TABS, Take 50 mg by mouth daily., Disp: , Rfl:   Observations/Objective: Patient is well-developed, well-nourished in no acute distress.  Resting comfortably  at home.  Head is normocephalic, atraumatic.  No labored breathing.  Speech is clear and coherent with logical content.  Patient is alert and oriented at baseline.    Assessment and Plan: 1. Acute bacterial sinusitis (Primary)  Increase fluids, humidifier at night, ibuprofen as directed, UC if sx worsen.   Follow Up Instructions: I discussed the assessment and treatment plan with the patient. The patient was provided an opportunity to ask questions and all were answered. The patient agreed with the plan and demonstrated an understanding of the instructions.  A copy of instructions were sent to the patient via MyChart unless otherwise noted below.     The patient was advised to call back or seek an in-person evaluation if the symptoms worsen or if the condition fails to improve as anticipated.    Georgana Curio, FNP

## 2023-11-03 ENCOUNTER — Encounter (INDEPENDENT_AMBULATORY_CARE_PROVIDER_SITE_OTHER): Payer: Self-pay | Admitting: Family Medicine

## 2023-11-03 ENCOUNTER — Ambulatory Visit (INDEPENDENT_AMBULATORY_CARE_PROVIDER_SITE_OTHER): Payer: 59 | Admitting: Family Medicine

## 2023-11-03 VITALS — BP 133/74 | HR 81 | Temp 98.4°F | Ht 67.0 in | Wt 322.0 lb

## 2023-11-03 DIAGNOSIS — J329 Chronic sinusitis, unspecified: Secondary | ICD-10-CM

## 2023-11-03 DIAGNOSIS — Z6841 Body Mass Index (BMI) 40.0 and over, adult: Secondary | ICD-10-CM

## 2023-11-03 DIAGNOSIS — E669 Obesity, unspecified: Secondary | ICD-10-CM

## 2023-11-03 DIAGNOSIS — B9789 Other viral agents as the cause of diseases classified elsewhere: Secondary | ICD-10-CM

## 2023-11-03 NOTE — Progress Notes (Signed)
 .smr  Office: [phone]  /  Fax: [phone]  WEIGHT SUMMARY AND BIOMETRICS  Anthropometric Measurements Height: 5' 7 (1.702 m) Weight: (!) 322 lb (146.1 kg) BMI (Calculated): 50.42 Weight at Last Visit: 322 lb Weight Lost Since Last Visit: 0 Weight Gained Since Last Visit: 0 Starting Weight: 372 lb Total Weight Loss (lbs): 52 lb (23.6 kg) Peak Weight: 372 lb   Body Composition  Body Fat %: 57.3 % Fat Mass (lbs): 185 lbs Muscle Mass (lbs): 130.8 lbs Total Body Water (lbs): 102.6 lbs Visceral Fat Rating : 22   Other Clinical Data Fasting: No Labs: No Today's Visit #: 30 Starting Date: 06/17/20    Chief Complaint: OBESITY    History of Present Illness   The patient, with a history of obesity, presents for a routine follow-up. She reports maintaining her weight over the holiday season, including Christmas and [place]. She has been adhering to a low-carb diet approximately 75% of the time and has been engaging in regular physical activity, walking 15-20 minutes daily. Her current BMI is 50.6.  The patient recently returned from a trip to [place], during which she developed a severe sinus infection. She completed a course of antibiotics and has been managing residual congestion with nasal rinses and Benadryl. She has a history of recurrent sinus infections, particularly when visiting [place], despite having undergone sinus surgery in the past.  The patient also has a history of allergies, for which she takes regular Zyrtec. She also uses multiple humidifiers at home to combat dry air, which exacerbates her sinus issues.  In terms of her obesity management, the patient is considering incorporating more vegetables into her diet and switching her morning routine from eating cheese sticks to having a protein shake with spinach or scrambled eggs with spinach and parmesan. She is also considering making changes to her low-carb diet to help manage her weight.  The  patient is also actively engaged in creative projects, which provide a source of physical activity. 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 She is currently on a regimen of Rybalsys and weekly Vitamin D . She has not reported any new symptoms or side effects from her current medications.          PHYSICAL EXAM:  Blood pressure 133/74, pulse 81, temperature 98.4 F (36.9 C), height 5' 7 (1.702 m), weight (!) 322 lb (146.1 kg), last menstrual period 02/13/2018, SpO2 95%. Body mass index is 50.43 kg/m.  DIAGNOSTIC DATA REVIEWED:  BMET    Component Value Date/Time   NA 138 09/29/2023 0810   K 5.1 09/29/2023 0810   CL 102 09/29/2023 0810   CO2 24 09/29/2023 0810   GLUCOSE 99 09/29/2023 0810   GLUCOSE 93 01/26/2018 1052   BUN 16 09/29/2023 0810   CREATININE 0.62 09/29/2023 0810   CALCIUM  9.3 09/29/2023 0810   GFRNONAA 100 09/29/2020 1058   GFRAA 115 09/29/2020 1058   Lab Results  Component Value Date   HGBA1C 6.0 (H) 09/29/2023   HGBA1C 6.4 (H) 06/17/2020   Lab Results  Component Value Date   INSULIN  21.3 09/29/2023   INSULIN  27.4 (H) 06/17/2020   Lab Results  Component Value Date   TSH 2.840 09/29/2023   CBC    Component Value Date/Time   WBC 7.8 05/19/2023 0858   WBC 8.7 01/11/2023 0924   RBC 5.06 05/19/2023 0858   RBC 4.95 01/11/2023 0924   HGB 14.2 05/19/2023 0858  HCT 43.9 05/19/2023 0858   PLT 435 05/19/2023 0858   MCV 87 05/19/2023 0858   MCH 28.1 05/19/2023 0858   MCHC 32.3 05/19/2023 0858   MCHC 33.3 01/11/2023 0924   RDW 12.8 05/19/2023 0858   Iron Studies    Component Value Date/Time   IRON 82 01/26/2018 1052   TIBC 391 01/26/2018 1052   FERRITIN 35 01/26/2018 1052   IRONPCTSAT 21 01/26/2018 1052   Lipid Panel     Component Value Date/Time   CHOL 198 09/29/2023 0810   CHOL 239 (H) 04/14/2021 1524   TRIG 106 09/29/2023 0810   TRIG 157 (H) 04/14/2021 1524   HDL 47 09/29/2023 0810   CHOLHDL 5 01/11/2023 0924    VLDL 32.4 01/11/2023 0924   LDLCALC 132 (H) 09/29/2023 0810   Hepatic Function Panel     Component Value Date/Time   PROT 6.9 09/29/2023 0810   ALBUMIN 4.3 09/29/2023 0810   AST 14 09/29/2023 0810   ALT 27 09/29/2023 0810   ALKPHOS 117 09/29/2023 0810   BILITOT 0.2 09/29/2023 0810      Component Value Date/Time   TSH 2.840 09/29/2023 0810   Nutritional Lab Results  Component Value Date   VD25OH 60.6 09/29/2023   VD25OH 60.1 08/11/2023   VD25OH 50.2 09/30/2022     Assessment and Plan    Sinusitis Persistent congestion post-antibiotics. Currently using nasal rinses and Benadryl. Advised to switch from Zyrtec to Zyrtec D. Discussed humidifier use for dryness. - Switch to Zyrtec D - Use a humidifier  Obesity BMI 50.6. Maintained weight over holidays, adhering to low-carb diet 75% of the time. Walking 15-20 minutes daily. Discussed increasing vegetables and protein shakes with spinach. Encouraged continued physical activity despite cold weather. Discussed alternative breakfast options like crustless egg bites with vegetables. Son-in-law's recent heart attack influencing dietary sodium considerations. - Continue low-carb diet - Increase vegetables and protein shakes with spinach - Consider crustless egg bites with vegetables for breakfast - Continue daily walking  General Health Maintenance Blood pressure lower at home. On Rybelsus  14 mg and weekly vitamin D . No new medications needed. - Continue Rybelsus  14 mg daily - Continue weekly vitamin D   Follow-up - Schedule follow-up in March.       She was informed of the importance of frequent follow up visits to maximize her success with intensive lifestyle modifications for her multiple health conditions.    Louann Penton, MD

## 2023-11-10 ENCOUNTER — Telehealth (INDEPENDENT_AMBULATORY_CARE_PROVIDER_SITE_OTHER): Payer: Self-pay | Admitting: *Deleted

## 2023-11-10 NOTE — Telephone Encounter (Signed)
PA SUBMITTED VIA [employer] FOR RYBELSUS 14 MG  Kelly Perez (Key: [id number])  This request has been approved.  ZOXWRU:[id number]; Status:Approved; Review Type:Prior Auth; Coverage Start Date:10/11/2023; Coverage End Date:11/09/2024;   PATIENT NOTIFIED VIA MYCHART.

## 2023-11-26 ENCOUNTER — Other Ambulatory Visit (INDEPENDENT_AMBULATORY_CARE_PROVIDER_SITE_OTHER): Payer: Self-pay | Admitting: Family Medicine

## 2023-11-26 DIAGNOSIS — E559 Vitamin D deficiency, unspecified: Secondary | ICD-10-CM

## 2023-11-28 LAB — HM DIABETES EYE EXAM

## 2023-12-06 ENCOUNTER — Encounter (INDEPENDENT_AMBULATORY_CARE_PROVIDER_SITE_OTHER): Payer: Self-pay

## 2023-12-06 ENCOUNTER — Ambulatory Visit (INDEPENDENT_AMBULATORY_CARE_PROVIDER_SITE_OTHER): Payer: 59 | Admitting: Family Medicine

## 2024-01-03 ENCOUNTER — Ambulatory Visit (INDEPENDENT_AMBULATORY_CARE_PROVIDER_SITE_OTHER): Payer: 59 | Admitting: Family Medicine

## 2024-01-03 ENCOUNTER — Encounter (INDEPENDENT_AMBULATORY_CARE_PROVIDER_SITE_OTHER): Payer: Self-pay | Admitting: Family Medicine

## 2024-01-03 VITALS — BP 137/85 | HR 66 | Temp 97.8°F | Ht 67.0 in | Wt 328.0 lb

## 2024-01-03 DIAGNOSIS — E538 Deficiency of other specified B group vitamins: Secondary | ICD-10-CM | POA: Diagnosis not present

## 2024-01-03 DIAGNOSIS — Z6841 Body Mass Index (BMI) 40.0 and over, adult: Secondary | ICD-10-CM

## 2024-01-03 DIAGNOSIS — E785 Hyperlipidemia, unspecified: Secondary | ICD-10-CM

## 2024-01-03 DIAGNOSIS — E119 Type 2 diabetes mellitus without complications: Secondary | ICD-10-CM | POA: Diagnosis not present

## 2024-01-03 DIAGNOSIS — E559 Vitamin D deficiency, unspecified: Secondary | ICD-10-CM | POA: Diagnosis not present

## 2024-01-03 DIAGNOSIS — Z7984 Long term (current) use of oral hypoglycemic drugs: Secondary | ICD-10-CM

## 2024-01-03 DIAGNOSIS — E1169 Type 2 diabetes mellitus with other specified complication: Secondary | ICD-10-CM

## 2024-01-03 DIAGNOSIS — E669 Obesity, unspecified: Secondary | ICD-10-CM

## 2024-01-03 DIAGNOSIS — E7849 Other hyperlipidemia: Secondary | ICD-10-CM

## 2024-01-03 MED ORDER — VITAMIN D (ERGOCALCIFEROL) 1.25 MG (50000 UNIT) PO CAPS
50000.0000 [IU] | ORAL_CAPSULE | ORAL | 0 refills | Status: DC
Start: 2024-01-03 — End: 2024-02-08

## 2024-01-03 MED ORDER — RYBELSUS 14 MG PO TABS: 1.0000 | ORAL_TABLET | Freq: Every day | ORAL | 0 refills | Status: DC

## 2024-01-03 NOTE — Progress Notes (Signed)
 Office: [phone]  /  Fax: [phone]  WEIGHT SUMMARY AND BIOMETRICS  Anthropometric Measurements Height: 5\' 7"  (1.702 m) Weight: (!) 328 lb (148.8 kg) BMI (Calculated): 51.36 Weight at Last Visit: 322 lb Weight Lost Since Last Visit: 0 Weight Gained Since Last Visit: 6 lb Total Weight Loss (lbs): 46 lb (20.9 kg)   Body Composition  Body Fat %: 57.8 % Fat Mass (lbs): 189.6 lbs Muscle Mass (lbs): 131.4 lbs Total Body Water (lbs): 105.4 lbs Visceral Fat Rating : 23   Other Clinical Data Fasting: no Labs: no Today's Visit #: 48 Starting Date: 06/17/20    Chief Complaint: OBESITY   Discussed the use of AI scribe software for clinical note transcription with the patient, who gave verbal consent to proceed.  History of Present Illness   The patient, with obesity and type 2 diabetes, presents for obesity treatment and progress monitoring.  She is adhering to a low-carb diet approximately 75% of the time and engages in walking for 15 minutes on most days. Despite these efforts, she has experienced a weight gain of six pounds over the past two months.  She has type 2 diabetes and is currently taking Rybelsus 14 mg. She is actively working on reducing simple carbohydrates in her diet to manage her diabetes. Her hemoglobin A1c has improved slightly from 6.1 to 6.0. She tolerates Rybelsus well, unlike metformin, which previously caused gastrointestinal discomfort.  She has a history of vitamin D deficiency and is on a prescription of vitamin D 50,000 IU weekly. Her recent vitamin D level was 60, which is within the desired range. She requests a refill for this medication.  She has a history of B12 deficiency and is currently taking a 5,000 mcg B12 supplement every two to three days. Despite this supplementation, her B12 levels were noted to be low in the last test. She uses both a tablet and a dissolvable form of the supplement.  She experiences difficulty maintaining her  diet while traveling, often resorting to peanut butter and jelly sandwiches on gluten-free bread and mozzarella sticks for breakfast. She recalls a past incident of gluten exposure at a hotel cafe, which resulted in vomiting, indicating her sensitivity to gluten.  She feels very tired since returning from a trip, which involved adjusting to time zone changes and daylight savings time. Her blood pressure was mildly elevated at 137/85, which she attributes to taking Aleve for a headache.          PHYSICAL EXAM:  Blood pressure 137/85, pulse 66, temperature 97.8 F (36.6 C), height 5\' 7"  (1.702 m), weight (!) 328 lb (148.8 kg), last menstrual period 02/13/2018, SpO2 95%. Body mass index is 51.37 kg/m.  DIAGNOSTIC DATA REVIEWED:  BMET    Component Value Date/Time   NA 138 09/29/2023 0810   K 5.1 09/29/2023 0810   CL 102 09/29/2023 0810   CO2 24 09/29/2023 0810   GLUCOSE 99 09/29/2023 0810   GLUCOSE 93 01/26/2018 1052   BUN 16 09/29/2023 0810   CREATININE 0.62 09/29/2023 0810   CALCIUM 9.3 09/29/2023 0810   GFRNONAA 100 09/29/2020 1058   GFRAA 115 09/29/2020 1058   Lab Results  Component Value Date   HGBA1C 6.0 (H) 09/29/2023   HGBA1C 6.4 (H) 06/17/2020   Lab Results  Component Value Date   INSULIN 21.3 09/29/2023   INSULIN 27.4 (H) 06/17/2020   Lab Results  Component Value Date   TSH 2.840 09/29/2023   CBC    Component Value  Date/Time   WBC 7.8 05/19/2023 0858   WBC 8.7 01/11/2023 0924   RBC 5.06 05/19/2023 0858   RBC 4.95 01/11/2023 0924   HGB 14.2 05/19/2023 0858   HCT 43.9 05/19/2023 0858   PLT 435 05/19/2023 0858   MCV 87 05/19/2023 0858   MCH 28.1 05/19/2023 0858   MCHC 32.3 05/19/2023 0858   MCHC 33.3 01/11/2023 0924   RDW 12.8 05/19/2023 0858   Iron Studies    Component Value Date/Time   IRON 82 01/26/2018 1052   TIBC 391 01/26/2018 1052   FERRITIN 35 01/26/2018 1052   IRONPCTSAT 21 01/26/2018 1052   Lipid Panel     Component Value Date/Time    CHOL 198 09/29/2023 0810   CHOL 239 (H) 04/14/2021 1524   TRIG 106 09/29/2023 0810   TRIG 157 (H) 04/14/2021 1524   HDL 47 09/29/2023 0810   CHOLHDL 5 01/11/2023 0924   VLDL 32.4 01/11/2023 0924   LDLCALC 132 (H) 09/29/2023 0810   Hepatic Function Panel     Component Value Date/Time   PROT 6.9 09/29/2023 0810   ALBUMIN 4.3 09/29/2023 0810   AST 14 09/29/2023 0810   ALT 27 09/29/2023 0810   ALKPHOS 117 09/29/2023 0810   BILITOT 0.2 09/29/2023 0810      Component Value Date/Time   TSH 2.840 09/29/2023 0810   Nutritional Lab Results  Component Value Date   VD25OH 60.6 09/29/2023   VD25OH 60.1 08/11/2023   VD25OH 50.2 09/30/2022     Assessment and Plan    Type 2 Diabetes Mellitus   She is on Rybelsus 14 mg with dietary modifications to reduce simple carbohydrates. Her hemoglobin A1c is 6.0, down from 6.1, indicating good glycemic control. Her insulin level is 21, appropriate for her A1c level, but her pancreas is exerting significant effort to maintain control. Rybelsus is well-tolerated without gastrointestinal side effects, unlike previous issues with metformin.   - Refill Rybelsus prescription   - Continue dietary modifications to reduce simple carbohydrates    Obesity   She adheres to a low-carbohydrate diet approximately 75% of the time and attempts to walk 15 minutes most days. Despite these efforts, she has gained six pounds in the last two months, with a four-pound increase in water weight, possibly due to recent travel and time zone changes, contributing to fatigue.   - Continue low-carbohydrate diet and regular walking   - Monitor weight and water retention    Hyperlipidemia   Her LDL cholesterol is in the 409W, exceeding the recommended level of below 70, likely due to genetic factors, as other cholesterol levels are satisfactory. Statins were discussed for their role in reducing hepatic cholesterol production, with the decision deferred to her and her primary  care provider. A calcium score test was suggested to evaluate arterial plaque risk.   - Discuss starting a statin with primary care provider   - Consider calcium score test   - Continue dietary modifications to reduce animal fats    Vitamin D Deficiency   She is on vitamin D 50,000 IU weekly, with current levels at 60, within the normal range.   - Refill vitamin D prescription    Vitamin B12 Deficiency   Her B12 level is low at 326 despite taking a 5,000 mcg tablet. It was recommended to take it every two to three days due to limited absorption beyond 2,000 mcg. Previously, levels were excessively high, indicating over-supplementation.   - Take 5,000 mcg of B12 every two  to three days       She was informed of the importance of frequent follow up visits to maximize her success with intensive lifestyle modifications for her multiple health conditions.    Quillian Quince, MD

## 2024-01-12 ENCOUNTER — Encounter: Payer: 59 | Admitting: Family Medicine

## 2024-01-19 ENCOUNTER — Encounter: Payer: Self-pay | Admitting: Family Medicine

## 2024-01-19 ENCOUNTER — Ambulatory Visit: Payer: 59 | Admitting: Family Medicine

## 2024-01-19 VITALS — BP 128/82 | HR 70 | Temp 98.3°F | Ht 67.0 in | Wt 330.0 lb

## 2024-01-19 DIAGNOSIS — E559 Vitamin D deficiency, unspecified: Secondary | ICD-10-CM | POA: Diagnosis not present

## 2024-01-19 DIAGNOSIS — I10 Essential (primary) hypertension: Secondary | ICD-10-CM

## 2024-01-19 DIAGNOSIS — Z8249 Family history of ischemic heart disease and other diseases of the circulatory system: Secondary | ICD-10-CM | POA: Diagnosis not present

## 2024-01-19 DIAGNOSIS — E538 Deficiency of other specified B group vitamins: Secondary | ICD-10-CM

## 2024-01-19 DIAGNOSIS — E7849 Other hyperlipidemia: Secondary | ICD-10-CM | POA: Diagnosis not present

## 2024-01-19 DIAGNOSIS — Z1231 Encounter for screening mammogram for malignant neoplasm of breast: Secondary | ICD-10-CM

## 2024-01-19 DIAGNOSIS — Z0000 Encounter for general adult medical examination without abnormal findings: Secondary | ICD-10-CM

## 2024-01-19 DIAGNOSIS — R931 Abnormal findings on diagnostic imaging of heart and coronary circulation: Secondary | ICD-10-CM

## 2024-01-19 DIAGNOSIS — E1169 Type 2 diabetes mellitus with other specified complication: Secondary | ICD-10-CM | POA: Diagnosis not present

## 2024-01-19 DIAGNOSIS — Z7984 Long term (current) use of oral hypoglycemic drugs: Secondary | ICD-10-CM

## 2024-01-19 LAB — MICROALBUMIN / CREATININE URINE RATIO
Creatinine,U: 46.9 mg/dL
Microalb Creat Ratio: UNDETERMINED mg/g (ref 0.0–30.0)
Microalb, Ur: <0.7 mg/dL

## 2024-01-19 MED ORDER — BISOPROLOL FUMARATE 10 MG PO TABS: 10.0000 mg | ORAL_TABLET | Freq: Every day | ORAL | 1 refills | Status: DC

## 2024-01-19 MED ORDER — EZETIMIBE 10 MG PO TABS: 10.0000 mg | ORAL_TABLET | Freq: Every day | ORAL | 3 refills | Status: DC

## 2024-01-19 NOTE — Patient Instructions (Addendum)

## 2024-01-19 NOTE — Progress Notes (Signed)
 Patient ID: Kelly Perez, female  DOB: August 31, 1966, 58 y.o.   MRN: [id number] Patient Care Team    Relationship Specialty Notifications Start End  Natalia Leatherwood, DO PCP - General Family Medicine  07/06/21   Tressia Danas, MD (Inactive) Consulting Physician Gastroenterology  07/08/21   Conley Rolls, My [place], [place] Referring Physician Optometry  07/08/21   Wilder Glade, MD Consulting Physician Bariatrics  07/08/21     Chief Complaint  Patient presents with   Annual Exam    And Chronic conditions management.     Subjective: Kelly Perez is a 58 y.o.  Female  present for CPE and Chronic Conditions/illness Management All past medical history, surgical history, allergies, family history, immunizations, medications and social history were updated in the electronic medical record today. All recent labs, ED visits and hospitalizations within the last year were reviewed.  Health maintenance:  Colonoscopy: completed 09/2022. Dr. Orvan Falconer - 10 yr f/u Mammogram: completed:06/06/2023 [facility]. > ordered placed  2025 Cervical cancer screening: last pap: 08/2022 WNL/cotest neg- 5 yr- Dr. Claiborne Billings Immunizations: tdap utd 2016, Influenza declined (encouraged yearly), shingrix declined, PNA20 UTD Infectious disease screening: HIV declined, Hep C completed DEXA: routine screen Patient has a Dental home. Hospitalizations/ED visits: reviewed  Hyperlipidemia associated with type 2 diabetes mellitus (HCC) Diabetes has been managed by her weight management team. She currently is prescribed Rybelsus 14 mg every day. Reports she has not been able to tolerate metformin.  Essential hypertension/lipidemia/morbid obesity/family history of heart disease Pt reports compliance with bisoprolol 10 mg daily and Zieta. Patient denies chest pain, shortness of breath, dizziness or lower extremity edema.   Pt is not taking a daily baby ASA. Pt is not prescribed statin (declined - does not like idea of statin). She is a  patient of weight management. RF: Hypertension, hyperlipidemia, obesity, diabetes, family history of heart disease    01/19/2024   10:37 AM 07/06/2023    2:11 PM 01/13/2023    7:48 AM 01/06/2022    8:49 AM 07/06/2021    9:56 AM  Depression screen PHQ 2/9  Decreased Interest 1 1 0 1 0  Down, Depressed, Hopeless 0 0 0 1 0  PHQ - 2 Score 1 1 0 2 0  Altered sleeping 1      Tired, decreased energy 1      Change in appetite 0      Feeling bad or failure about yourself  0      Trouble concentrating 0      Moving slowly or fidgety/restless 0      Suicidal thoughts 0      PHQ-9 Score 3      Difficult doing work/chores Somewhat difficult          01/19/2024   10:37 AM 01/26/2018   10:43 AM  GAD 7 : Generalized Anxiety Score  Nervous, Anxious, on Edge 1 1  Control/stop worrying 1 1  Worry too much - different things 1 1  Trouble relaxing 1 1  Restless 0 1  Easily annoyed or irritable 0 2  Afraid - awful might happen 0 1  Total GAD 7 Score 4 8  Anxiety Difficulty Somewhat difficult Somewhat difficult    Immunization History  Administered Date(s) Administered   Influenza,inj,Quad PF,6+ Mos 06/22/2018   PFIZER(Purple Top)SARS-COV-2 Vaccination 02/11/2020, 03/03/2020   PNEUMOCOCCAL CONJUGATE-20 01/06/2022   Tdap 10/25/2014    Past Medical History:  Diagnosis Date   Allergy    Anemia    Anxiety  Arthritis    Bilateral primary osteoarthritis of knee 01/26/2018   Celiac disease 01/26/2018   Chronic depression 01/26/2018   Complication of anesthesia    woke up during a few times   Constipation    Food allergy    Gallbladder problem    GERD (gastroesophageal reflux disease)    Hypertension    Morbid obesity (HCC) 01/26/2018   Osteoarthritis    PTSD (post-traumatic stress disorder)    Sleep apnea    cpap   Vitamin B12 deficiency    due to celiac   Vitamin D deficiency    Allergies  Allergen Reactions   Neosporin [Neomycin-Bacitracin Zn-Polymyx] Rash   Tetracycline  Anaphylaxis   Other Other (See Comments)    Glutin celiac disease   Neomycin Rash    blistering   Past Surgical History:  Procedure Laterality Date   CHOLECYSTECTOMY  2015   COLONOSCOPY WITH PROPOFOL N/A 10/04/2022   Procedure: COLONOSCOPY WITH PROPOFOL;  Surgeon: Tressia Danas, MD;  Location: [facility];  Service: Gastroenterology;  Laterality: N/A;   POLYPECTOMY  10/04/2022   Procedure: POLYPECTOMY;  Surgeon: Tressia Danas, MD;  Location: [facility];  Service: Gastroenterology;;   SINUS IRRIGATION     Family History  Problem Relation Age of Onset   Bipolar disorder Mother    Diabetes Mother    Hypertension Mother    Heart disease Mother    Depression Mother    Obesity Mother    COPD Father    Depression Father    Early death Father    Heart disease Father    Kidney disease Father    Cardiomyopathy Father    Arthritis Father    Hypertension Father    Bipolar disorder Sister    Diabetes Brother    Heart disease Brother    Diabetes Maternal Grandmother    Bipolar disorder Maternal Grandfather    Diabetes Paternal Grandmother    Diabetes Paternal Grandfather    Hypertension Daughter    Depression Daughter    Diabetes Daughter    Rectal cancer Maternal Aunt    Stomach cancer Neg Hx    Liver disease Neg Hx    Colon cancer Neg Hx    Pancreatic cancer Neg Hx    Esophageal cancer Neg Hx    Social History   Social History Narrative   Originally from [place]; lived in [place] x 2 years prior to moving to [place] in 2018 to be near only daughter and grandkids.   Lives alone with great Dane. Divorced.    Education/employment: Masters degree.  Employed as a Warden/ranger.   Safety:      -smoke alarm in the home:Yes     - wears seatbelt: Yes     - Feels safe in their relationships: Yes       Allergies as of 01/19/2024       Reactions   Neosporin [neomycin-bacitracin Zn-polymyx] Rash   Tetracycline Anaphylaxis   Other Other (See Comments)    Glutin celiac disease   Neomycin Rash   blistering        Medication List        Accurate as of January 19, 2024 10:53 AM. If you have any questions, ask your nurse or doctor.          bisoprolol 10 MG tablet Commonly known as: ZEBETA Take 1 tablet (10 mg total) by mouth daily.   cetirizine 10 MG tablet Commonly known as: ZYRTEC Take 10 mg by mouth daily.  ezetimibe 10 MG tablet Commonly known as: Zetia Take 1 tablet (10 mg total) by mouth at bedtime.   Iron 325 (65 Fe) MG Tabs Take 650 mg by mouth daily.   Magnesium 400 MG Tabs Take 400 mg by mouth daily.   Rybelsus 14 MG Tabs Generic drug: Semaglutide Take 1 tablet (14 mg total) by mouth daily.   SUPER ENZYMES PO Take 1 tablet by mouth daily.   Turmeric 500 MG Caps Take 1,000 mg by mouth daily.   Vitamin D (Ergocalciferol) 1.25 MG (50000 UNIT) Caps capsule Commonly known as: DRISDOL Take 1 capsule (50,000 Units total) by mouth every 7 (seven) days.   Zinc 50 MG Tabs Take 50 mg by mouth daily.        All past medical history, surgical history, allergies, family history, immunizations andmedications were updated in the EMR today and reviewed under the history and medication portions of their EMR.       ROS 14 pt review of systems performed and negative (unless mentioned in an HPI)  Objective: BP 128/82   Pulse 70   Temp 98.3 F (36.8 C)   Ht 5\' 7"  (1.702 m)   Wt (!) 330 lb (149.7 kg)   LMP 02/13/2018   SpO2 99%   BMI 51.69 kg/m  Physical Exam Vitals and nursing note reviewed.  Constitutional:      General: She is not in acute distress.    Appearance: Normal appearance. She is not ill-appearing or toxic-appearing.  HENT:     Head: Normocephalic and atraumatic.     Right Ear: Tympanic membrane, ear canal and external ear normal. There is no impacted cerumen.     Left Ear: Tympanic membrane, ear canal and external ear normal. There is no impacted cerumen.     Nose: No congestion or  rhinorrhea.     Mouth/Throat:     Mouth: Mucous membranes are moist.     Pharynx: Oropharynx is clear. No oropharyngeal exudate or posterior oropharyngeal erythema.  Eyes:     General: No scleral icterus.       Right eye: No discharge.        Left eye: No discharge.     Extraocular Movements: Extraocular movements intact.     Conjunctiva/sclera: Conjunctivae normal.     Pupils: Pupils are equal, round, and reactive to light.  Cardiovascular:     Rate and Rhythm: Normal rate and regular rhythm.     Pulses: Normal pulses.     Heart sounds: Normal heart sounds. No murmur heard.    No friction rub. No gallop.  Pulmonary:     Effort: Pulmonary effort is normal. No respiratory distress.     Breath sounds: Normal breath sounds. No stridor. No wheezing, rhonchi or rales.  Chest:     Chest wall: No tenderness.  Abdominal:     General: Abdomen is flat. Bowel sounds are normal. There is no distension.     Palpations: Abdomen is soft. There is no mass.     Tenderness: There is no abdominal tenderness. There is no right CVA tenderness, left CVA tenderness, guarding or rebound.     Hernia: No hernia is present.  Musculoskeletal:        General: No swelling, tenderness or deformity. Normal range of motion.     Cervical back: Normal range of motion and neck supple. No rigidity or tenderness.     Right lower leg: No edema.     Left lower leg: No edema.  Lymphadenopathy:     Cervical: No cervical adenopathy.  Skin:    General: Skin is warm and dry.     Coloration: Skin is not jaundiced or pale.     Findings: No bruising, erythema, lesion or rash.  Neurological:     General: No focal deficit present.     Mental Status: She is alert and oriented to person, place, and time. Mental status is at baseline.     Cranial Nerves: No cranial nerve deficit.     Sensory: No sensory deficit.     Motor: No weakness.     Coordination: Coordination normal.     Gait: Gait normal.     Deep Tendon Reflexes:  Reflexes normal.  Psychiatric:        Mood and Affect: Mood normal.        Behavior: Behavior normal.        Thought Content: Thought content normal.        Judgment: Judgment normal.      No results found.  Assessment/plan: Jeane Cashatt is a 58 y.o. female present for CPE and chronic Conditions/illness Management Hyperlipidemia associated with type 2 diabetes mellitus (HCC) Diabetes has been managed by her weight management team. Rybelsus 14 mg > prescribed by Dr. Dalbert Garnet A1c 6.5> 6.1> 6.2> 6.2> 6.4>5.9>5.9>6.0>6.1 >6.0 (09/2023) - Urine Microalbumin w/creat. Ratio collected today - FOOT: exam completed 01/19/2024 - [facility]  Essential hypertension/lipidemia/morbid obesity/family history of heart disease Stable continue bisoprolol 10 mg daily. Discussed different options for cholesterol management.-Declines statin Continue  Zetia 10 mg daily  Reviewed cmp and lipids from Jan 2025- ldl not at goal of < 100 Cardiac CT screen discussed and ordered today (self pay)  Vitamin D deficiency Stable lab UTD Continue  supplement  Vitamin B12 deficiency Stable Continue supplement.  Lab up-to-date   Return in about 24 weeks (around 07/05/2024) for Routine chronic condition follow-up.  Orders Placed This Encounter  Procedures   MM 3D SCREENING MAMMOGRAM BILATERAL BREAST   CT CARDIAC SCORING (SELF PAY ONLY)   Urine Microalbumin w/creat. ratio    Meds ordered this encounter  Medications   bisoprolol (ZEBETA) 10 MG tablet    Sig: Take 1 tablet (10 mg total) by mouth daily.    Dispense:  90 tablet    Refill:  1   ezetimibe (ZETIA) 10 MG tablet    Sig: Take 1 tablet (10 mg total) by mouth at bedtime.    Dispense:  90 tablet    Refill:  3   Referral Orders  No referral(s) requested today     Electronically signed by: Felix Pacini, DO [facility] [place]

## 2024-01-20 ENCOUNTER — Encounter: Payer: Self-pay | Admitting: Family Medicine

## 2024-02-08 ENCOUNTER — Other Ambulatory Visit (INDEPENDENT_AMBULATORY_CARE_PROVIDER_SITE_OTHER): Payer: Self-pay | Admitting: Family Medicine

## 2024-02-08 DIAGNOSIS — E559 Vitamin D deficiency, unspecified: Secondary | ICD-10-CM

## 2024-02-08 DIAGNOSIS — E1169 Type 2 diabetes mellitus with other specified complication: Secondary | ICD-10-CM

## 2024-02-09 MED ORDER — RYBELSUS 14 MG PO TABS: 1.0000 | ORAL_TABLET | Freq: Every day | ORAL | 0 refills | Status: DC

## 2024-02-09 MED ORDER — VITAMIN D (ERGOCALCIFEROL) 1.25 MG (50000 UNIT) PO CAPS
50000.0000 [IU] | ORAL_CAPSULE | ORAL | 0 refills | Status: DC
Start: 2024-02-09 — End: 2024-02-16

## 2024-02-16 ENCOUNTER — Ambulatory Visit (INDEPENDENT_AMBULATORY_CARE_PROVIDER_SITE_OTHER): Admitting: Family Medicine

## 2024-02-16 ENCOUNTER — Encounter (INDEPENDENT_AMBULATORY_CARE_PROVIDER_SITE_OTHER): Payer: Self-pay | Admitting: Family Medicine

## 2024-02-16 VITALS — BP 114/73 | HR 74 | Temp 98.2°F | Ht 67.0 in | Wt 328.0 lb

## 2024-02-16 DIAGNOSIS — Z6841 Body Mass Index (BMI) 40.0 and over, adult: Secondary | ICD-10-CM

## 2024-02-16 DIAGNOSIS — E559 Vitamin D deficiency, unspecified: Secondary | ICD-10-CM | POA: Diagnosis not present

## 2024-02-16 DIAGNOSIS — E119 Type 2 diabetes mellitus without complications: Secondary | ICD-10-CM | POA: Diagnosis not present

## 2024-02-16 DIAGNOSIS — Z7984 Long term (current) use of oral hypoglycemic drugs: Secondary | ICD-10-CM

## 2024-02-16 DIAGNOSIS — E1169 Type 2 diabetes mellitus with other specified complication: Secondary | ICD-10-CM

## 2024-02-16 DIAGNOSIS — E669 Obesity, unspecified: Secondary | ICD-10-CM | POA: Insufficient documentation

## 2024-02-16 MED ORDER — VITAMIN D (ERGOCALCIFEROL) 1.25 MG (50000 UNIT) PO CAPS: 50000.0000 [IU] | ORAL_CAPSULE | ORAL | 0 refills | Status: DC

## 2024-02-16 NOTE — Progress Notes (Signed)
 Office: [phone]  /  Fax: [phone]  WEIGHT SUMMARY AND BIOMETRICS  Anthropometric Measurements Height: 5\' 7"  (1.702 m) Weight: (!) 328 lb (148.8 kg) BMI (Calculated): 51.36 Weight at Last Visit: 328 lb Weight Lost Since Last Visit: 0 Weight Gained Since Last Visit: 0 Starting Weight: 372 lb Total Weight Loss (lbs): 46 lb (20.9 kg)   Body Composition  Body Fat %: 57.4 % Fat Mass (lbs): 188.6 lbs Muscle Mass (lbs): 132.8 lbs Total Body Water (lbs): 105 lbs Visceral Fat Rating : 23   Other Clinical Data Fasting: no Labs: no Today's Visit #: 48 Starting Date: 06/17/20    Chief Complaint: OBESITY    History of Present Illness Kelly Perez is a 58 year old female who presents for a follow-up on her obesity treatment plan.  She is adhering to a low-carb eating plan approximately 75% of the time, but her weight has remained unchanged over the past six weeks. She engages in regular physical activity, including walking her dog most days for about 20 minutes and gardening, which involves carrying water jugs. She notes an increase in muscle mass, attributed to her regular dog walking routine.  She has a history of vitamin D  deficiency and is currently taking prescription vitamin D  at a dose of 50,000 international units weekly. She requests a refill for this medication.  She has recently incorporated strawberries and blueberries into her diet to address constipation experienced over the past few weeks, which has improved with these dietary changes.  She consumes a large amount of tea daily, mixing half green tea and half regular tea, and uses drink packets with extra water to manage sweetness. She typically wakes up twice a night to urinate, increasing to three times with higher fluid intake before bed.      PHYSICAL EXAM:  Blood pressure 114/73, pulse 74, temperature 98.2 F (36.8 C), height 5\' 7"  (1.702 m), weight (!) 328 lb (148.8 kg), last menstrual period  02/13/2018, SpO2 94%. Body mass index is 51.37 kg/m.  DIAGNOSTIC DATA REVIEWED:  BMET    Component Value Date/Time   NA 138 09/29/2023 0810   K 5.1 09/29/2023 0810   CL 102 09/29/2023 0810   CO2 24 09/29/2023 0810   GLUCOSE 99 09/29/2023 0810   GLUCOSE 93 01/26/2018 1052   BUN 16 09/29/2023 0810   CREATININE 0.62 09/29/2023 0810   CALCIUM  9.3 09/29/2023 0810   GFRNONAA 100 09/29/2020 1058   GFRAA 115 09/29/2020 1058   Lab Results  Component Value Date   HGBA1C 6.0 (H) 09/29/2023   HGBA1C 6.4 (H) 06/17/2020   Lab Results  Component Value Date   INSULIN  21.3 09/29/2023   INSULIN  27.4 (H) 06/17/2020   Lab Results  Component Value Date   TSH 2.840 09/29/2023   CBC    Component Value Date/Time   WBC 7.8 05/19/2023 0858   WBC 8.7 01/11/2023 0924   RBC 5.06 05/19/2023 0858   RBC 4.95 01/11/2023 0924   HGB 14.2 05/19/2023 0858   HCT 43.9 05/19/2023 0858   PLT 435 05/19/2023 0858   MCV 87 05/19/2023 0858   MCH 28.1 05/19/2023 0858   MCHC 32.3 05/19/2023 0858   MCHC 33.3 01/11/2023 0924   RDW 12.8 05/19/2023 0858   Iron Studies    Component Value Date/Time   IRON 82 01/26/2018 1052   TIBC 391 01/26/2018 1052   FERRITIN 35 01/26/2018 1052   IRONPCTSAT 21 01/26/2018 1052   Lipid Panel     Component  Value Date/Time   CHOL 198 09/29/2023 0810   CHOL 239 (H) 04/14/2021 1524   TRIG 106 09/29/2023 0810   TRIG 157 (H) 04/14/2021 1524   HDL 47 09/29/2023 0810   CHOLHDL 5 01/11/2023 0924   VLDL 32.4 01/11/2023 0924   LDLCALC 132 (H) 09/29/2023 0810   Hepatic Function Panel     Component Value Date/Time   PROT 6.9 09/29/2023 0810   ALBUMIN 4.3 09/29/2023 0810   AST 14 09/29/2023 0810   ALT 27 09/29/2023 0810   ALKPHOS 117 09/29/2023 0810   BILITOT 0.2 09/29/2023 0810      Component Value Date/Time   TSH 2.840 09/29/2023 0810   Nutritional Lab Results  Component Value Date   VD25OH 60.6 09/29/2023   VD25OH 60.1 08/11/2023   VD25OH 50.2 09/30/2022      Assessment and Plan Assessment & Plan Obesity and DM II Obesity management with a low-carb eating plan. She adheres to the plan 75% of the time. Weight has been well-managed over the past six weeks. She has incorporated walking her dog most days for 20 minutes and increased muscle mass. Recent dietary adjustments include incorporating strawberries and blueberries to alleviate constipation, aligning with a more paleo-style diet. She is advised to be cautious with high-sugar fruits and to use portion control. - Continue low-carb eating plan with adjustments towards a paleo-style diet, minimizing cheese and incorporating more fruits like berries and melons. - Encourage continued physical activity, including walking and gardening activities. - Continue Rybelsus  and will follow  Vitamin D  deficiency Vitamin D  deficiency managed with prescription vitamin D  50,000 IU weekly. She requests a refill and reports issues with pharmacy fulfillment. Prescription was sent a week ago, but not yet filled. - Ensure prescription for vitamin D  50,000 IU weekly is sent to the pharmacy. - Verify with the pharmacy regarding the status of the vitamin D  prescription.   She was informed of the importance of frequent follow up visits to maximize her success with intensive lifestyle modifications for her multiple health conditions.    Jasmine Mesi, MD

## 2024-02-19 ENCOUNTER — Other Ambulatory Visit (INDEPENDENT_AMBULATORY_CARE_PROVIDER_SITE_OTHER): Payer: Self-pay | Admitting: Family Medicine

## 2024-02-19 DIAGNOSIS — E559 Vitamin D deficiency, unspecified: Secondary | ICD-10-CM

## 2024-03-05 ENCOUNTER — Ambulatory Visit (HOSPITAL_BASED_OUTPATIENT_CLINIC_OR_DEPARTMENT_OTHER)
Admission: RE | Admit: 2024-03-05 | Discharge: 2024-03-05 | Disposition: A | Discharge status: Home / Self Care | Source: Ambulatory Visit | Attending: Family Medicine | Admitting: Family Medicine

## 2024-03-05 DIAGNOSIS — I10 Essential (primary) hypertension: Secondary | ICD-10-CM | POA: Insufficient documentation

## 2024-03-05 DIAGNOSIS — E7849 Other hyperlipidemia: Secondary | ICD-10-CM | POA: Insufficient documentation

## 2024-03-05 DIAGNOSIS — E1169 Type 2 diabetes mellitus with other specified complication: Secondary | ICD-10-CM | POA: Insufficient documentation

## 2024-03-08 ENCOUNTER — Ambulatory Visit: Payer: Self-pay | Admitting: Family Medicine

## 2024-03-08 DIAGNOSIS — E7849 Other hyperlipidemia: Secondary | ICD-10-CM

## 2024-03-08 DIAGNOSIS — I10 Essential (primary) hypertension: Secondary | ICD-10-CM

## 2024-03-08 DIAGNOSIS — R931 Abnormal findings on diagnostic imaging of heart and coronary circulation: Secondary | ICD-10-CM | POA: Insufficient documentation

## 2024-03-08 NOTE — Telephone Encounter (Signed)
 Please call patient  Coronary calcium  score results is of high risk with a total score of 201, which is the 95th percentile.     - With the above findings, would encourage her to reconsider starting the statin medication we have discussed in the past.  She could continue Zetia , and take a statin low-dose twice a week to provide her with the cardiovascular protection the statin group provides.   -If she is willing to start a statin twice we can continue Zetia  I will call let them today.   Lastly, with a high risk score, 95th percentile, she should discuss results with a cardiologist.  If she is agreeable to cardiology referral, I will place that for her today.   Please advise on her decision on statin and referral

## 2024-03-08 NOTE — Telephone Encounter (Signed)
 Communication  Reason for CRM: Patient called to follow-up on Stain Medication. Agent read verbatim the message Dr.Kuneff wrote. Patient said she is fine with the Cardiologist referral and for taking Statin twice a day  along with Zetia .   FYI

## 2024-03-09 MED ORDER — ROSUVASTATIN CALCIUM 5 MG PO TABS: ORAL_TABLET | ORAL | 3 refills | Status: DC

## 2024-03-09 NOTE — Addendum Note (Signed)
 Addended by: Napolean Backbone A on: 03/09/2024 03:30 PM   Modules accepted: Orders

## 2024-03-15 ENCOUNTER — Ambulatory Visit (INDEPENDENT_AMBULATORY_CARE_PROVIDER_SITE_OTHER): Admitting: Family Medicine

## 2024-03-15 ENCOUNTER — Encounter (INDEPENDENT_AMBULATORY_CARE_PROVIDER_SITE_OTHER): Payer: Self-pay | Admitting: Family Medicine

## 2024-03-15 VITALS — BP 115/76 | HR 82 | Temp 98.4°F | Ht 65.5 in | Wt 327.0 lb

## 2024-03-15 DIAGNOSIS — K900 Celiac disease: Secondary | ICD-10-CM | POA: Diagnosis not present

## 2024-03-15 DIAGNOSIS — E785 Hyperlipidemia, unspecified: Secondary | ICD-10-CM

## 2024-03-15 DIAGNOSIS — E7849 Other hyperlipidemia: Secondary | ICD-10-CM

## 2024-03-15 DIAGNOSIS — E669 Obesity, unspecified: Secondary | ICD-10-CM

## 2024-03-15 DIAGNOSIS — I2510 Atherosclerotic heart disease of native coronary artery without angina pectoris: Secondary | ICD-10-CM

## 2024-03-15 DIAGNOSIS — Z6841 Body Mass Index (BMI) 40.0 and over, adult: Secondary | ICD-10-CM

## 2024-03-15 DIAGNOSIS — E66813 Obesity, class 3: Secondary | ICD-10-CM

## 2024-03-15 DIAGNOSIS — E1169 Type 2 diabetes mellitus with other specified complication: Secondary | ICD-10-CM

## 2024-03-15 DIAGNOSIS — E559 Vitamin D deficiency, unspecified: Secondary | ICD-10-CM

## 2024-03-15 DIAGNOSIS — E538 Deficiency of other specified B group vitamins: Secondary | ICD-10-CM

## 2024-03-15 MED ORDER — VITAMIN D (ERGOCALCIFEROL) 1.25 MG (50000 UNIT) PO CAPS: 50000.0000 [IU] | ORAL_CAPSULE | ORAL | 0 refills | Status: DC

## 2024-03-15 MED ORDER — RYBELSUS 14 MG PO TABS
1.0000 | ORAL_TABLET | Freq: Every day | ORAL | 0 refills | Status: DC
Start: 2024-03-15 — End: 2024-04-12

## 2024-03-15 NOTE — Progress Notes (Signed)
 Office: [phone]  /  Fax: [phone]  WEIGHT SUMMARY AND BIOMETRICS  Anthropometric Measurements Height: 5' 5.5" (1.664 m) (rechecked height today) Weight: (!) 327 lb (148.3 kg) BMI (Calculated): 53.57 Weight at Last Visit: 328 lb Weight Lost Since Last Visit: 1 lb Weight Gained Since Last Visit: 0 Starting Weight: 372 lb Total Weight Loss (lbs): 45 lb (20.4 kg) Peak Weight: 372 lb   Body Composition  Body Fat %: 58 % Fat Mass (lbs): 189.8 lbs Muscle Mass (lbs): 130.4 lbs Total Body Water (lbs): 101 lbs Visceral Fat Rating : 24   Other Clinical Data Fasting: no Labs: no Today's Visit #: 53 Starting Date: 06/17/20 Comments: rechecked height today    Chief Complaint: OBESITY    History of Present Illness Kelly Perez is a 58 year old female who presents for obesity treatment and progress assessment.  She follows a low-carb eating plan 85-90% of the time and has incorporated walking her dog twice daily for 15-20 minutes as part of her exercise routine. She has lost one pound in the last month. To address constipation, she has added fruits like cantaloupe, watermelon, strawberries, and blueberries to her diet. She also enjoys a gluten-free, sugar-free protein nut roll bar.  She has celiac disease and reports no recent issues, managing her condition well. She has started rosuvastatin  after a cholesterol test result of 201. She experienced a headache after starting the medication but is unsure if it will persist. She has been hydrating well to mitigate potential side effects.  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 She has a dog that she takes to doggie daycare on Saturdays to manage his energy levels, and she has been training him since adopting him as a stray a year ago.      PHYSICAL EXAM:  Blood pressure 115/76, pulse 82, temperature 98.4 F (36.9 C), height 5' 5.5" (1.664 m),  weight (!) 327 lb (148.3 kg), last menstrual period 02/13/2018, SpO2 95%. Body mass index is 53.59 kg/m.  DIAGNOSTIC DATA REVIEWED:  BMET    Component Value Date/Time   NA 138 09/29/2023 0810   K 5.1 09/29/2023 0810   CL 102 09/29/2023 0810   CO2 24 09/29/2023 0810   GLUCOSE 99 09/29/2023 0810   GLUCOSE 93 01/26/2018 1052   BUN 16 09/29/2023 0810   CREATININE 0.62 09/29/2023 0810   CALCIUM  9.3 09/29/2023 0810   GFRNONAA 100 09/29/2020 1058   GFRAA 115 09/29/2020 1058   Lab Results  Component Value Date   HGBA1C 6.0 (H) 09/29/2023   HGBA1C 6.4 (H) 06/17/2020   Lab Results  Component Value Date   INSULIN  21.3 09/29/2023   INSULIN  27.4 (H) 06/17/2020   Lab Results  Component Value Date   TSH 2.840 09/29/2023   CBC    Component Value Date/Time   WBC 7.8 05/19/2023 0858   WBC 8.7 01/11/2023 0924   RBC 5.06 05/19/2023 0858   RBC 4.95 01/11/2023 0924   HGB 14.2 05/19/2023 0858   HCT 43.9 05/19/2023 0858   PLT 435 05/19/2023 0858   MCV 87 05/19/2023 0858   MCH 28.1 05/19/2023 0858   MCHC 32.3 05/19/2023 0858   MCHC 33.3 01/11/2023 0924   RDW 12.8 05/19/2023 0858   Iron Studies    Component Value Date/Time   IRON 82 01/26/2018 1052   TIBC 391 01/26/2018 1052   FERRITIN 35 01/26/2018 1052   IRONPCTSAT 21 01/26/2018 1052  Lipid Panel     Component Value Date/Time   CHOL 198 09/29/2023 0810   CHOL 239 (H) 04/14/2021 1524   TRIG 106 09/29/2023 0810   TRIG 157 (H) 04/14/2021 1524   HDL 47 09/29/2023 0810   CHOLHDL 5 01/11/2023 0924   VLDL 32.4 01/11/2023 0924   LDLCALC 132 (H) 09/29/2023 0810   Hepatic Function Panel     Component Value Date/Time   PROT 6.9 09/29/2023 0810   ALBUMIN 4.3 09/29/2023 0810   AST 14 09/29/2023 0810   ALT 27 09/29/2023 0810   ALKPHOS 117 09/29/2023 0810   BILITOT 0.2 09/29/2023 0810      Component Value Date/Time   TSH 2.840 09/29/2023 0810   Nutritional Lab Results  Component Value Date   VD25OH 60.6 09/29/2023    VD25OH 60.1 08/11/2023   VD25OH 50.2 09/30/2022     Assessment and Plan Assessment & Plan Coronary artery disease Coronary artery disease confirmed by calcium  score testing. Increased risk of heart disease due to celiac disease-related inflammation. - Attend cardiologist appointment in September for further evaluation  Hyperlipidemia Initiated on rosuvastatin  for cholesterol level of 201. Experienced headache post-initiation. Emphasized hydration to mitigate statin-associated muscle aches. Rosuvastatin  is a preferred treatment option. - Continue rosuvastatin  - Increase water intake to prevent dehydration and muscle aches  Celiac disease No recent issues reported. Cautious with vaccinations due to potential adverse reactions. Aware of increased heart disease risk due to inflammation. - Continue gluten-free diet  Obesity Adhering to a low-carb diet 85-90% of the time and walking twice daily for 15-20 minutes. Lost one pound since last visit. Incorporating fruit to address constipation. - Continue low-carb diet - Maintain daily walking regimen - Incorporate fruit into diet to manage constipation  I have personally spent 30 minutes total time today in preparation, patient care, and documentation for this visit, including the following: review of clinical lab tests; review of medical tests/procedures/services and nutritional counseling   She was informed of the importance of frequent follow up visits to maximize her success with intensive lifestyle modifications for her multiple health conditions.    Jasmine Mesi, MD

## 2024-04-03 LAB — CMP14+EGFR
ALT: 12 IU/L (ref 0–32)
AST: 13 IU/L (ref 0–40)
Albumin: 4.3 g/dL (ref 3.8–4.9)
Alkaline Phosphatase: 88 IU/L (ref 44–121)
BUN/Creatinine Ratio: 25 — ABNORMAL HIGH (ref 9–23)
BUN: 17 mg/dL (ref 6–24)
Bilirubin Total: 0.2 mg/dL (ref 0.0–1.2)
CO2: 20 mmol/L (ref 20–29)
Calcium: 9.7 mg/dL (ref 8.7–10.2)
Chloride: 102 mmol/L (ref 96–106)
Creatinine, Ser: 0.68 mg/dL (ref 0.57–1.00)
Globulin, Total: 2.6 g/dL (ref 1.5–4.5)
Glucose: 103 mg/dL — ABNORMAL HIGH (ref 70–99)
Potassium: 5 mmol/L (ref 3.5–5.2)
Sodium: 140 mmol/L (ref 134–144)
Total Protein: 6.9 g/dL (ref 6.0–8.5)
eGFR: 100 mL/min/{1.73_m2} (ref >59)

## 2024-04-03 LAB — CBC WITH DIFFERENTIAL/PLATELET
Basophils Absolute: 0.1 10*3/uL (ref 0.0–0.2)
Basos: 1 %
EOS (ABSOLUTE): 0.3 10*3/uL (ref 0.0–0.4)
Eos: 4 %
Hematocrit: 41.8 % (ref 34.0–46.6)
Hemoglobin: 13.6 g/dL (ref 11.1–15.9)
Immature Grans (Abs): 0 10*3/uL (ref 0.0–0.1)
Immature Granulocytes: 0 %
Lymphocytes Absolute: 2.4 10*3/uL (ref 0.7–3.1)
Lymphs: 30 %
MCH: 28.8 pg (ref 26.6–33.0)
MCHC: 32.5 g/dL (ref 31.5–35.7)
MCV: 89 fL (ref 79–97)
Monocytes Absolute: 0.7 10*3/uL (ref 0.1–0.9)
Monocytes: 9 %
Neutrophils Absolute: 4.6 10*3/uL (ref 1.4–7.0)
Neutrophils: 56 %
Platelets: 422 10*3/uL (ref 150–450)
RBC: 4.72 x10E6/uL (ref 3.77–5.28)
RDW: 13 % (ref 11.7–15.4)
WBC: 8.1 10*3/uL (ref 3.4–10.8)

## 2024-04-03 LAB — VITAMIN D 25 HYDROXY (VIT D DEFICIENCY, FRACTURES): Vit D, 25-Hydroxy: 75.6 ng/mL (ref 30.0–100.0)

## 2024-04-03 LAB — LIPID PANEL WITH LDL/HDL RATIO
Cholesterol, Total: 157 mg/dL (ref 100–199)
HDL: 39 mg/dL — ABNORMAL LOW (ref >39)
LDL Chol Calc (NIH): 99 mg/dL (ref 0–99)
LDL/HDL Ratio: 2.5 ratio (ref 0.0–3.2)
Triglycerides: 102 mg/dL (ref 0–149)
VLDL Cholesterol Cal: 19 mg/dL (ref 5–40)

## 2024-04-03 LAB — VITAMIN B12: Vitamin B-12: >2000 pg/mL — ABNORMAL HIGH (ref 232–1245)

## 2024-04-03 LAB — INSULIN, RANDOM: INSULIN: 22.1 u[IU]/mL (ref 2.6–24.9)

## 2024-04-03 LAB — HEMOGLOBIN A1C
Est. average glucose Bld gHb Est-mCnc: 123 mg/dL
Hgb A1c MFr Bld: 5.9 % — ABNORMAL HIGH (ref 4.8–5.6)

## 2024-04-03 LAB — TSH: TSH: 1.94 u[IU]/mL (ref 0.450–4.500)

## 2024-04-12 ENCOUNTER — Encounter (INDEPENDENT_AMBULATORY_CARE_PROVIDER_SITE_OTHER): Payer: Self-pay | Admitting: Family Medicine

## 2024-04-12 ENCOUNTER — Ambulatory Visit (INDEPENDENT_AMBULATORY_CARE_PROVIDER_SITE_OTHER): Admitting: Family Medicine

## 2024-04-12 VITALS — BP 113/75 | HR 93 | Temp 98.1°F | Ht 65.5 in | Wt 323.0 lb

## 2024-04-12 DIAGNOSIS — E119 Type 2 diabetes mellitus without complications: Secondary | ICD-10-CM

## 2024-04-12 DIAGNOSIS — E66813 Obesity, class 3: Secondary | ICD-10-CM

## 2024-04-12 DIAGNOSIS — Z6841 Body Mass Index (BMI) 40.0 and over, adult: Secondary | ICD-10-CM

## 2024-04-12 DIAGNOSIS — E559 Vitamin D deficiency, unspecified: Secondary | ICD-10-CM | POA: Diagnosis not present

## 2024-04-12 DIAGNOSIS — E7849 Other hyperlipidemia: Secondary | ICD-10-CM

## 2024-04-12 DIAGNOSIS — E785 Hyperlipidemia, unspecified: Secondary | ICD-10-CM | POA: Diagnosis not present

## 2024-04-12 DIAGNOSIS — E669 Obesity, unspecified: Secondary | ICD-10-CM

## 2024-04-12 DIAGNOSIS — Z7985 Long-term (current) use of injectable non-insulin antidiabetic drugs: Secondary | ICD-10-CM

## 2024-04-12 DIAGNOSIS — E1169 Type 2 diabetes mellitus with other specified complication: Secondary | ICD-10-CM

## 2024-04-12 MED ORDER — RYBELSUS 14 MG PO TABS: 1.0000 | ORAL_TABLET | Freq: Every day | ORAL | 0 refills | Status: DC

## 2024-04-12 MED ORDER — VITAMIN D (ERGOCALCIFEROL) 1.25 MG (50000 UNIT) PO CAPS: 50000.0000 [IU] | ORAL_CAPSULE | ORAL | 0 refills | Status: DC

## 2024-04-12 NOTE — Progress Notes (Signed)
 Office: [phone]  /  Fax: [phone]  WEIGHT SUMMARY AND BIOMETRICS  Anthropometric Measurements Height: 5' 5.5 (1.664 m) Weight: (!) 323 lb (146.5 kg) BMI (Calculated): 52.91 Weight at Last Visit: 328 lb Weight Lost Since Last Visit: 4 lb Weight Gained Since Last Visit: 0 Starting Weight: 372 lb Total Weight Loss (lbs): 49 lb (22.2 kg) Peak Weight: 372 lb   Body Composition  Body Fat %: 58.1 % Fat Mass (lbs): 188.2 lbs Muscle Mass (lbs): 128.8 lbs Total Body Water (lbs): 101.4 lbs Visceral Fat Rating : 23   Other Clinical Data Fasting: no Labs: no Today's Visit #: 50 Starting Date: 06/17/20    Chief Complaint: OBESITY    History of Present Illness Kelly Perez is a 58 year old female who presents for a follow-up on her obesity treatment plan.  She is adhering to a low-carb eating plan approximately 75% of the time and engages in physical activity by walking 20 minutes seven times a week. She has lost four pounds since her last visit, indicating progress in her weight management efforts.  She experiences mild headaches, particularly the day after taking her new statin medication, which she takes twice a week. She also reports having 'crazy, crazy dreams' and some body pains, though these are not unbearable. She attributes some of the headache to stress from work.  Her current medications include Rybelsus  and vitamin D , which she is due to refill. She prefers obtaining her medications from [facility] due to concerns about the security of her mailbox, which is located [place].  Recent laboratory results show her fasting glucose at 103, improved hydration status, and improved liver enzymes. Her LDL cholesterol is below 100, and her A1c is 5.9. Her vitamin D  levels have increased to the 70s, and she takes vitamin D  once a week. She also takes mega doses of iron daily due to a history of anemia.  She plans to attend a small  convention in August in [place], [place] , and will drive there, taking her own food due to dietary needs. She listens to [employer] comedy channels to stay alert during long drives.      PHYSICAL EXAM:  Blood pressure 113/75, pulse 93, temperature 98.1 F (36.7 C), height 5' 5.5 (1.664 m), weight (!) 323 lb (146.5 kg), last menstrual period 02/13/2018, SpO2 95%. Body mass index is 52.93 kg/m.  DIAGNOSTIC DATA REVIEWED:  BMET    Component Value Date/Time   NA 140 04/02/2024 0817   K 5.0 04/02/2024 0817   CL 102 04/02/2024 0817   CO2 20 04/02/2024 0817   GLUCOSE 103 (H) 04/02/2024 0817   GLUCOSE 93 01/26/2018 1052   BUN 17 04/02/2024 0817   CREATININE 0.68 04/02/2024 0817   CALCIUM  9.7 04/02/2024 0817   GFRNONAA 100 09/29/2020 1058   GFRAA 115 09/29/2020 1058   Lab Results  Component Value Date   HGBA1C 5.9 (H) 04/02/2024   HGBA1C 6.4 (H) 06/17/2020   Lab Results  Component Value Date   INSULIN  22.1 04/02/2024   INSULIN  27.4 (H) 06/17/2020   Lab Results  Component Value Date   TSH 1.940 04/02/2024   CBC    Component Value Date/Time   WBC 8.1 04/02/2024 0817   WBC 8.7 01/11/2023 0924   RBC 4.72 04/02/2024 0817   RBC 4.95 01/11/2023 0924   HGB 13.6 04/02/2024 0817   HCT 41.8 04/02/2024 0817   PLT 422 04/02/2024 0817   MCV  89 04/02/2024 0817   MCH 28.8 04/02/2024 0817   MCHC 32.5 04/02/2024 0817   MCHC 33.3 01/11/2023 0924   RDW 13.0 04/02/2024 0817   Iron Studies    Component Value Date/Time   IRON 82 01/26/2018 1052   TIBC 391 01/26/2018 1052   FERRITIN 35 01/26/2018 1052   IRONPCTSAT 21 01/26/2018 1052   Lipid Panel     Component Value Date/Time   CHOL 157 04/02/2024 0817   CHOL 239 (H) 04/14/2021 1524   TRIG 102 04/02/2024 0817   TRIG 157 (H) 04/14/2021 1524   HDL 39 (L) 04/02/2024 0817   CHOLHDL 5 01/11/2023 0924   VLDL 32.4 01/11/2023 0924   LDLCALC 99 04/02/2024 0817   Hepatic Function Panel     Component Value Date/Time    PROT 6.9 04/02/2024 0817   ALBUMIN 4.3 04/02/2024 0817   AST 13 04/02/2024 0817   ALT 12 04/02/2024 0817   ALKPHOS 88 04/02/2024 0817   BILITOT 0.2 04/02/2024 0817      Component Value Date/Time   TSH 1.940 04/02/2024 0817   Nutritional Lab Results  Component Value Date   VD25OH 75.6 04/02/2024   VD25OH 60.6 09/29/2023   VD25OH 60.1 08/11/2023     Assessment and Plan Assessment & Plan Type 2 Diabetes Mellitus A1c improved to 5.9, indicating good control. Fasting glucose is 103, not concerning. Continued management through diet and exercise emphasized. - Continue current diabetes management plan - Monitor A1c and fasting glucose levels  Obesity Following a low-carb eating plan 75% of the time and walking 20 minutes daily. Lost 4 pounds since last visit. Focus on continued weight loss through diet and exercise. - Continue low-carb eating plan - Encourage daily walking for 20 minutes - Monitor weight loss progress  Hyperlipidemia On a new statin taken twice a week, lowering LDL to below 100. Experiences mild headaches and body pains, which are tolerable. Improvement suggests a genetic component. Adequate hydration may help reduce headaches. - Continue statin therapy twice a week - Encourage increased hydration, especially when taking statin - Monitor lipid levels  Vitamin D  Deficiency Vitamin D  levels increased to the 70s, higher than target range of 50-60, attributed to weight loss. Supplementation schedule adjusted to prevent excessive levels. - Adjust vitamin D  supplementation to every other week - Monitor vitamin D  levels  Follow-up Appointments scheduled for July and August. Importance of maintaining these appointments emphasized. - Confirm July and August appointments with the front desk - Schedule September appointment if needed      She was informed of the importance of frequent follow up visits to maximize her success with intensive lifestyle modifications for  her multiple health conditions.    Jasmine Mesi, MD

## 2024-04-20 ENCOUNTER — Telehealth: Payer: Self-pay

## 2024-04-20 NOTE — Telephone Encounter (Signed)
 Communication  Reason for CRM: Patient would like to know when the  mobile unit for diabetic eye  fleeta will be onsite,or how she could go about getting scheduled for it?Dr Catherine mentioned it to her but she wasn't sure how to get scheduled.    Called and spoke with pt. Advised pt that we currently do not have dates scheduled for eye exams in office. Pt understood and had no further questions.

## 2024-04-27 ENCOUNTER — Encounter: Payer: Self-pay | Admitting: Family Medicine

## 2024-05-23 ENCOUNTER — Ambulatory Visit (INDEPENDENT_AMBULATORY_CARE_PROVIDER_SITE_OTHER): Admitting: Family Medicine

## 2024-05-23 ENCOUNTER — Encounter (INDEPENDENT_AMBULATORY_CARE_PROVIDER_SITE_OTHER): Payer: Self-pay | Admitting: Family Medicine

## 2024-05-23 VITALS — BP 96/63 | HR 75 | Temp 98.1°F | Ht 65.5 in | Wt 325.0 lb

## 2024-05-23 DIAGNOSIS — M170 Bilateral primary osteoarthritis of knee: Secondary | ICD-10-CM | POA: Diagnosis not present

## 2024-05-23 DIAGNOSIS — E669 Obesity, unspecified: Secondary | ICD-10-CM

## 2024-05-23 DIAGNOSIS — E7849 Other hyperlipidemia: Secondary | ICD-10-CM

## 2024-05-23 DIAGNOSIS — E785 Hyperlipidemia, unspecified: Secondary | ICD-10-CM

## 2024-05-23 DIAGNOSIS — K900 Celiac disease: Secondary | ICD-10-CM | POA: Diagnosis not present

## 2024-05-23 DIAGNOSIS — Z6841 Body Mass Index (BMI) 40.0 and over, adult: Secondary | ICD-10-CM

## 2024-05-23 DIAGNOSIS — E1169 Type 2 diabetes mellitus with other specified complication: Secondary | ICD-10-CM

## 2024-05-23 NOTE — Progress Notes (Signed)
 Office: [phone]  /  Fax: [phone]  WEIGHT SUMMARY AND BIOMETRICS  Anthropometric Measurements Height: 5' 5.5 (1.664 m) Weight: (!) 325 lb (147.4 kg) BMI (Calculated): 53.24 Weight at Last Visit: 323 lb Weight Lost Since Last Visit: 0 Weight Gained Since Last Visit: 2 lb Starting Weight: 372 lb Total Weight Loss (lbs): 47 lb (21.3 kg) Peak Weight: 372 lb   Body Composition  Body Fat %: 58.7 % Fat Mass (lbs): 190.8 lbs Muscle Mass (lbs): 127.4 lbs Visceral Fat Rating : 24   Other Clinical Data Fasting: no Labs: no Today's Visit #: 51 Starting Date: 06/17/20    Chief Complaint: OBESITY    History of Present Illness Kelly Perez is a 58 year old female who presents for a follow-up on her obesity treatment plan and progress.  She is adhering to a low carbohydrate eating plan with an 80% success rate. Despite her efforts, she has gained two pounds over the past six weeks. She incorporates fruit into her diet to aid digestion and enjoys vanilla snack packs with blueberries as a sweet treat. She is experimenting with bamboo fiber flour for low-carb, gluten-free cooking.  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 She exercises seven times a week, focusing on leg strengthening exercises to prepare for an upcoming trip to [place]. She experiences difficulty with steps and reports that previous x-rays showed arthritis in all three compartments of her knees. Previous x-rays have confirmed arthritis, and she has had adverse reactions to injections in the past.  She is currently taking statins, which cause leg aches and vivid dreams. She manages leg aches with exercise and notes that the dreams are most intense on the night she takes the medication. She also uses Rybelsus  and vitamin D , both on a 90-day supply.  Her social history includes plans to travel to [place] with her family,  including her daughter and two grandsons, to celebrate her 58th birthday and her daughter's birthday. She is motivated to improve her fitness for this trip. No lightheadedness or dizziness despite low blood pressure readings.      PHYSICAL EXAM:  Blood pressure 96/63, pulse 75, temperature 98.1 F (36.7 C), height 5' 5.5 (1.664 m), weight (!) 325 lb (147.4 kg), last menstrual period 02/13/2018, SpO2 95%. Body mass index is 53.26 kg/m.  DIAGNOSTIC DATA REVIEWED:  BMET    Component Value Date/Time   NA 140 04/02/2024 0817   K 5.0 04/02/2024 0817   CL 102 04/02/2024 0817   CO2 20 04/02/2024 0817   GLUCOSE 103 (H) 04/02/2024 0817   GLUCOSE 93 01/26/2018 1052   BUN 17 04/02/2024 0817   CREATININE 0.68 04/02/2024 0817   CALCIUM  9.7 04/02/2024 0817   GFRNONAA 100 09/29/2020 1058   GFRAA 115 09/29/2020 1058   Lab Results  Component Value Date   HGBA1C 5.9 (H) 04/02/2024   HGBA1C 6.4 (H) 06/17/2020   Lab Results  Component Value Date   INSULIN  22.1 04/02/2024   INSULIN  27.4 (H) 06/17/2020   Lab Results  Component Value Date   TSH 1.940 04/02/2024   CBC    Component Value Date/Time   WBC 8.1 04/02/2024 0817   WBC 8.7 01/11/2023 0924   RBC 4.72 04/02/2024 0817   RBC 4.95 01/11/2023 0924   HGB 13.6 04/02/2024 0817   HCT 41.8 04/02/2024 0817   PLT 422 04/02/2024 0817   MCV 89 04/02/2024 0817   MCH 28.8  04/02/2024 0817   MCHC 32.5 04/02/2024 0817   MCHC 33.3 01/11/2023 0924   RDW 13.0 04/02/2024 0817   Iron Studies    Component Value Date/Time   IRON 82 01/26/2018 1052   TIBC 391 01/26/2018 1052   FERRITIN 35 01/26/2018 1052   IRONPCTSAT 21 01/26/2018 1052   Lipid Panel     Component Value Date/Time   CHOL 157 04/02/2024 0817   CHOL 239 (H) 04/14/2021 1524   TRIG 102 04/02/2024 0817   TRIG 157 (H) 04/14/2021 1524   HDL 39 (L) 04/02/2024 0817   CHOLHDL 5 01/11/2023 0924   VLDL 32.4 01/11/2023 0924   LDLCALC 99 04/02/2024 0817   Hepatic Function Panel      Component Value Date/Time   PROT 6.9 04/02/2024 0817   ALBUMIN 4.3 04/02/2024 0817   AST 13 04/02/2024 0817   ALT 12 04/02/2024 0817   ALKPHOS 88 04/02/2024 0817   BILITOT 0.2 04/02/2024 0817      Component Value Date/Time   TSH 1.940 04/02/2024 0817   Nutritional Lab Results  Component Value Date   VD25OH 75.6 04/02/2024   VD25OH 60.6 09/29/2023   VD25OH 60.1 08/11/2023     Assessment and Plan Assessment & Plan Celiac Celiac disease is managed with a gluten-free diet. She is aware of the importance of avoiding gluten and is actively managing her diet to prevent symptoms. - Continue gluten-free diet  Obesity Obesity is addressed through a low carbohydrate diet and regular exercise. She follows a low carb eating plan successfully about 80% of the time, incorporating fruits like watermelon, cantaloupe, strawberries, and blueberries in moderation. She exercises seven times a week, including working with a Systems analyst twice a week to strengthen her legs and knees for a trip to [place]. Despite these efforts, she has gained two pounds in the last six weeks. Discussed the importance of monitoring exercise intensity to avoid increased hunger that could lead to consuming more calories than burned. She is experimenting with low-carb, gluten-free alternatives like bamboo fiber flour. - Continue low carbohydrate diet with mindful inclusion of fruits - Continue exercise regimen with personal trainer - Monitor exercise intensity to prevent increased hunger - Experiment with low-carb, gluten-free alternatives like bamboo fiber flour  Hyperlipidemia Hyperlipidemia is managed with dietary modifications and exercise. She is on statins but experiences side effects such as leg aches and vivid dreams. Exercise is helping with the leg aches. - Continue statin therapy - Monitor for side effects of statins - Continue dietary modifications and exercise  Bilateral primary osteoarthritis of  knees Bilateral primary osteoarthritis of the knees causes difficulty with steps and bending due to arthritis in all three compartments of the knees. She focuses on strengthening her legs and knees through exercise to improve mobility for an upcoming trip. She prefers to avoid knee injections due to past adverse reactions. Discussed alternative pain management strategies, including exercise and possibly TENS units. - Continue leg strengthening exercises - Avoid knee injections due to past adverse reactions - Continue diet, exercise and weight loss as discussed today as an important part of her treatment plan       She was informed of the importance of frequent follow up visits to maximize her success with intensive lifestyle modifications for her multiple health conditions.    Louann Penton, MD

## 2024-06-19 LAB — HM MAMMOGRAPHY

## 2024-06-20 ENCOUNTER — Encounter (INDEPENDENT_AMBULATORY_CARE_PROVIDER_SITE_OTHER): Payer: Self-pay | Admitting: Family Medicine

## 2024-06-20 ENCOUNTER — Ambulatory Visit (INDEPENDENT_AMBULATORY_CARE_PROVIDER_SITE_OTHER): Admitting: Family Medicine

## 2024-06-20 VITALS — BP 116/76 | HR 74 | Temp 97.9°F | Ht 65.5 in | Wt 325.0 lb

## 2024-06-20 DIAGNOSIS — K900 Celiac disease: Secondary | ICD-10-CM | POA: Diagnosis not present

## 2024-06-20 DIAGNOSIS — E669 Obesity, unspecified: Secondary | ICD-10-CM | POA: Diagnosis not present

## 2024-06-20 DIAGNOSIS — E1169 Type 2 diabetes mellitus with other specified complication: Secondary | ICD-10-CM

## 2024-06-20 DIAGNOSIS — Z7985 Long-term (current) use of injectable non-insulin antidiabetic drugs: Secondary | ICD-10-CM

## 2024-06-20 DIAGNOSIS — E559 Vitamin D deficiency, unspecified: Secondary | ICD-10-CM | POA: Diagnosis not present

## 2024-06-20 DIAGNOSIS — Z6841 Body Mass Index (BMI) 40.0 and over, adult: Secondary | ICD-10-CM

## 2024-06-20 DIAGNOSIS — E66813 Obesity, class 3: Secondary | ICD-10-CM

## 2024-06-20 MED ORDER — VITAMIN D (ERGOCALCIFEROL) 1.25 MG (50000 UNIT) PO CAPS
50000.0000 [IU] | ORAL_CAPSULE | ORAL | 0 refills | Status: DC
Start: 2024-06-20 — End: 2024-08-15

## 2024-06-20 MED ORDER — RYBELSUS 14 MG PO TABS: 1.0000 | ORAL_TABLET | Freq: Every day | ORAL | 0 refills | Status: DC

## 2024-06-20 NOTE — Progress Notes (Signed)
 Office: [phone]  /  Fax: [phone]  WEIGHT SUMMARY AND BIOMETRICS  Anthropometric Measurements Height: 5' 5.5 (1.664 m) Weight: (!) 325 lb (147.4 kg) BMI (Calculated): 53.24 Weight at Last Visit: 325 lb Weight Lost Since Last Visit: 0 Weight Gained Since Last Visit: 0 Starting Weight: 372 lb Total Weight Loss (lbs): 47 lb (21.3 kg) Peak Weight: 372 lb   Body Composition  Body Fat %: 58.1 % Fat Mass (lbs): 189 lbs Muscle Mass (lbs): 129.6 lbs Total Body Water (lbs): 101.4 lbs Visceral Fat Rating : 24   Other Clinical Data Fasting: no Labs: no Today's Visit #: 75 Starting Date: 06/17/20    Chief Complaint: OBESITY    History of Present Illness Kelly Perez is a 58 year old female with obesity who presents for obesity treatment plan assessment and progress evaluation.  She adheres to a low-carb eating plan approximately 75% of the time and engages in strengthening exercises and walking her dog for about 60 minutes two days a week. Her weight has remained stable since her last visit one month ago. She notes an increase in muscle mass by two pounds, particularly in her legs, and her total weight has not changed.  She experiences vivid dreams and some difficulty sleeping, which she attributes to her statin medication. No chest discomfort, palpitations, or low blood sugar symptoms. Her blood pressure has been low at times, with readings as low as 106/65, but she has not experienced significant dizziness or lightheadedness.  She manages her celiac disease through a gluten-free diet and reports doing well as long as she adheres to this diet. She is vigilant about gluten content in foods and is cautious with certain products like cottage cheese, ensuring they are gluten-free.  She is being treated for vitamin D  deficiency with a prescription of 50,000 IU of vitamin D  per week and requests a refill.  She is currently taking Crestor  and Zetia  for cholesterol  management. She experiences vivid dreams as a side effect of the statin but notes improvement in leg pain since starting strength training.  She discusses her protein intake, noting that she may not be consuming enough, particularly from whole food sources. She mentions that protein supplements do not satiate her hunger as effectively as whole foods.  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      PHYSICAL EXAM:  Blood pressure 116/76, pulse 74, temperature 97.9 F (36.6 C), height 5' 5.5 (1.664 m), weight (!) 325 lb (147.4 kg), last menstrual period 02/13/2018, SpO2 96%. Body mass index is 53.26 kg/m.  DIAGNOSTIC DATA REVIEWED:  BMET    Component Value Date/Time   NA 140 04/02/2024 0817   K 5.0 04/02/2024 0817   CL 102 04/02/2024 0817   CO2 20 04/02/2024 0817   GLUCOSE 103 (H) 04/02/2024 0817   GLUCOSE 93 01/26/2018 1052   BUN 17 04/02/2024 0817   CREATININE 0.68 04/02/2024 0817   CALCIUM  9.7 04/02/2024 0817   GFRNONAA 100 09/29/2020 1058   GFRAA 115 09/29/2020 1058   Lab Results  Component Value Date   HGBA1C 5.9 (H) 04/02/2024   HGBA1C 6.4 (H) 06/17/2020   Lab Results  Component Value Date   INSULIN  22.1 04/02/2024   INSULIN  27.4 (H) 06/17/2020   Lab Results  Component Value Date   TSH 1.940 04/02/2024   CBC    Component Value Date/Time   WBC 8.1 04/02/2024 0817  WBC 8.7 01/11/2023 0924   RBC 4.72 04/02/2024 0817   RBC 4.95 01/11/2023 0924   HGB 13.6 04/02/2024 0817   HCT 41.8 04/02/2024 0817   PLT 422 04/02/2024 0817   MCV 89 04/02/2024 0817   MCH 28.8 04/02/2024 0817   MCHC 32.5 04/02/2024 0817   MCHC 33.3 01/11/2023 0924   RDW 13.0 04/02/2024 0817   Iron Studies    Component Value Date/Time   IRON 82 01/26/2018 1052   TIBC 391 01/26/2018 1052   FERRITIN 35 01/26/2018 1052   IRONPCTSAT 21 01/26/2018 1052   Lipid Panel     Component Value Date/Time    CHOL 157 04/02/2024 0817   CHOL 239 (H) 04/14/2021 1524   TRIG 102 04/02/2024 0817   TRIG 157 (H) 04/14/2021 1524   HDL 39 (L) 04/02/2024 0817   CHOLHDL 5 01/11/2023 0924   VLDL 32.4 01/11/2023 0924   LDLCALC 99 04/02/2024 0817   Hepatic Function Panel     Component Value Date/Time   PROT 6.9 04/02/2024 0817   ALBUMIN 4.3 04/02/2024 0817   AST 13 04/02/2024 0817   ALT 12 04/02/2024 0817   ALKPHOS 88 04/02/2024 0817   BILITOT 0.2 04/02/2024 0817      Component Value Date/Time   TSH 1.940 04/02/2024 0817   Nutritional Lab Results  Component Value Date   VD25OH 75.6 04/02/2024   VD25OH 60.6 09/29/2023   VD25OH 60.1 08/11/2023     Assessment and Plan Assessment & Plan Obesity Obesity management with a low-carb eating plan, adherence at 75%. Engaging in strengthening exercises and walking her dog for 60 minutes twice a week. Weight maintenance since last visit, with an increase in muscle mass by 2 pounds and a decrease in fat. Hunger levels are manageable. Protein intake may be insufficient, particularly for upper body strength. Recommended protein intake is 120 grams per day, primarily from whole foods. Discussed the benefits of whole food protein over supplements, including increased calorie burn during digestion. - Continue low-carb eating plan - Increase protein intake to 120 grams per day from whole foods - Track protein intake - Continue strengthening exercises and walking  Celiac disease Celiac disease managed through a gluten-free diet. No current symptoms as long as she adheres to the diet. Discussion on gluten content in various foods and the importance of maintaining a gluten-free diet. - Continue gluten-free diet  Vitamin D  deficiency Vitamin D  deficiency treated with prescription vitamin D  50,000 IU weekly. - Refill prescription for vitamin D  50,000 IU weekly  Type 2 diabetes mellitus No current issues with hypoglycemia or hyperglycemia. Blood pressure is  low but asymptomatic, with a reading of 106/65 mmHg. No dizziness or lightheadedness reported. Cardiologist appointment scheduled for next week to discuss blood pressure management and potential medication adjustments. Current medications include Crestor  and Zetia , with noted side effects of vivid dreams from statins. - Discuss blood pressure management with cardiologist - Continue Crestor  and Zetia     She was informed of the importance of frequent follow up visits to maximize her success with intensive lifestyle modifications for her multiple health conditions.    Louann Penton, MD

## 2024-06-22 ENCOUNTER — Encounter: Payer: Self-pay | Admitting: Family Medicine

## 2024-06-28 ENCOUNTER — Encounter (HOSPITAL_BASED_OUTPATIENT_CLINIC_OR_DEPARTMENT_OTHER): Payer: Self-pay | Admitting: Cardiology

## 2024-06-28 ENCOUNTER — Ambulatory Visit (HOSPITAL_BASED_OUTPATIENT_CLINIC_OR_DEPARTMENT_OTHER): Admitting: Cardiology

## 2024-06-28 VITALS — BP 124/86 | HR 71 | Ht 65.5 in | Wt 331.0 lb

## 2024-06-28 DIAGNOSIS — E785 Hyperlipidemia, unspecified: Secondary | ICD-10-CM | POA: Diagnosis not present

## 2024-06-28 DIAGNOSIS — R931 Abnormal findings on diagnostic imaging of heart and coronary circulation: Secondary | ICD-10-CM | POA: Diagnosis not present

## 2024-06-28 DIAGNOSIS — I10 Essential (primary) hypertension: Secondary | ICD-10-CM | POA: Diagnosis not present

## 2024-06-28 NOTE — Progress Notes (Signed)
 Cardiology Office Note:  .   Date:  06/28/2024  ID:  Kelly Perez, DOB [birth year], MRN [id number] PCP: Catherine Charlies LABOR, DO  [facility] Providers Cardiologist:  None     History of Present Illness: .   Kelly Perez is a 58 y.o. female Discussed the use of AI scribe software for clinical note transcription with the patient, who gave verbal consent to proceed.  History of Present Illness Kelly Perez is a 59 year old female with type 2 diabetes and coronary artery disease who presents for evaluation of elevated coronary calcium  score.  She presents with an elevated coronary calcium  score of 201, which is in the 95th percentile. No cardiac symptoms such as chest pain or angina are present. The coronary calcium  test was prompted by her family history of heart disease, as her son-in-law, who was previously healthy, experienced major heart problems due to a 90% stenosis. She is concerned due to her family history, with her mother having heart disease and her father having cardiomyopathy, both of whom died at relatively young ages.  She has a history of type 2 diabetes, currently managing her condition with a hemoglobin A1c of 5.9. She is also working with a healthy weight and wellness program to address her morbid obesity, with a BMI of 54. She has started attending [employer] twice a week for strength training.  She is on Crestor  (rosuvastatin ) 5 mg taken on Mondays and Thursdays, and Zetia  (ezetimibe ) 10 mg daily for cholesterol management. Her last LDL was 99. She experiences vivid dreams and difficulty sleeping as side effects of Crestor . Initially, she had leg aches, which have improved with weight training.  She is also on bisoprolol  10 mg daily for hypertension. Her recent lab results include a creatinine of 0.68, potassium of 5.0, hemoglobin of 13.6, and triglycerides of 102.  She has celiac disease, which she notes contributes to inflammation issues. She is not a smoker and has  no history of smoking.   Studies Reviewed: SABRA   EKG Interpretation Date/Time:  Thursday June 28 2024 13:32:40 EDT Ventricular Rate:  71 PR Interval:  204 QRS Duration:  84 QT Interval:  384 QTC Calculation: 417 R Axis:   -32  Text Interpretation: Normal sinus rhythm Left axis deviation Low voltage QRS No previous ECGs available Confirmed by Jeffrie Anes ([id number]) on 06/28/2024 1:47:03 PM    Results LABS TSH: 1.9 Hemoglobin A1c: 5.9 LDL: 99 (04/02/2024) Creatinine: 0.68 Potassium: 5.0 Hemoglobin: 13.6 Triglycerides: 102  RADIOLOGY Coronary calcium  score: 201  DIAGNOSTIC REPORTS EKG: Left axis deviation Risk Assessment/Calculations:            Physical Exam:   VS:  BP 124/86   Pulse 71   Ht 5' 5.5 (1.664 m)   Wt (!) 331 lb (150.1 kg)   LMP 02/13/2018   SpO2 96%   BMI 54.24 kg/m    Wt Readings from Last 3 Encounters:  06/28/24 (!) 331 lb (150.1 kg)  06/20/24 (!) 325 lb (147.4 kg)  05/23/24 (!) 325 lb (147.4 kg)    GEN: Well nourished, well developed in no acute distress NECK: No JVD; No carotid bruits CARDIAC: RRR, no murmurs, no rubs, no gallops RESPIRATORY:  Clear to auscultation without rales, wheezing or rhonchi  ABDOMEN: Soft, non-tender, non-distended EXTREMITIES:  No edema; No deformity   ASSESSMENT AND PLAN: .    Assessment and Plan Assessment & Plan Coronary artery disease with coronary artery calcification Images from CT were personally reviewed with her.  Coronary artery disease with a coronary calcium  score of 201, indicating calcified plaque. Asymptomatic with no current cardiac symptoms. Current LDL is 99, with a goal of less than 70 due to coronary artery disease. Discussed the presence of calcified and potential soft plaque, and the importance of lowering LDL to stabilize plaque and reduce inflammation. Discussed Repatha as a non-statin option to significantly lower LDL and stabilize plaque, with the potential to be a game changer in reducing  cardiovascular risk. Explained that Repatha is an injection administered every two weeks, with an alternative option of an injection every six months. Emphasized the importance of monitoring for symptoms of coronary artery disease, especially atypical symptoms in women. - Refer to pharmacy team for Repatha initiation to lower LDL cholesterol. Goal < 70 with CAD.  - Continue current medications: Crestor  5 mg twice a week and Zetia  10 mg daily. - Educate on symptoms of coronary artery disease, especially atypical symptoms in women.  Type 2 diabetes mellitus Type 2 diabetes mellitus with well-controlled blood glucose levels. Hemoglobin A1c is 5.9, indicating good management of diabetes.  Morbid obesity Morbid obesity with a BMI of 54. Engaged in a weight management program and attending strength training sessions twice a week.  Essential hypertension Essential hypertension managed with bisoprolol  10 mg daily. Blood pressure is currently well-controlled. As she loses weight, blood pressure will improve as well.         Dispo: 1 yr APP. Pharm team for Repatha.   Signed, Oneil Parchment, MD

## 2024-06-28 NOTE — Patient Instructions (Addendum)
 Medication Instructions:  The current medical regimen is effective;  continue present plan and medications.  *If you need a refill on your cardiac medications before your next appointment, please call your pharmacy*  You have been referred to our Lipid Clinic at our [facility] office.  Follow-Up: At [facility], you and your health needs are our priority.  As part of our continuing mission to provide you with exceptional heart care, our providers are all part of one team.  This team includes your primary Cardiologist (physician) and Advanced Practice Providers or APPs (Physician Assistants and Nurse Practitioners) who all work together to provide you with the care you need, when you need it.  Your next appointment:   1 year(s)  Provider:   One of our Advanced Practice Providers (APPs): Morse Clause, PA-C  Lamarr Satterfield, NP Miriam Shams, NP  Olivia Pavy, PA-C Josefa Beauvais, NP  Leontine Salen, PA-C Orren Fabry, PA-C  [place], PA-C Ernest Dick, NP  Damien Braver, NP Jon Hails, PA-C  Waddell Donath, PA-C    Dayna Dunn, PA-C  Scott Weaver, PA-C Lum Louis, NP Katlyn West, NP Callie Goodrich, PA-C  Evan Williams, PA-C Sheng Haley, PA-C  Xika Zhao, NP Kathleen Johnson, PA-C       We recommend signing up for the patient portal called MyChart.  Sign up information is provided on this After Visit Summary.  MyChart is used to connect with patients for Virtual Visits (Telemedicine).  Patients are able to view lab/test results, encounter notes, upcoming appointments, etc.  Non-urgent messages can be sent to your provider as well.   To learn more about what you can do with MyChart, go to [email or web address].

## 2024-07-06 ENCOUNTER — Encounter: Payer: Self-pay | Admitting: Family Medicine

## 2024-07-06 ENCOUNTER — Ambulatory Visit: Admitting: Family Medicine

## 2024-07-06 VITALS — BP 130/80 | HR 69 | Temp 98.2°F | Wt 328.8 lb

## 2024-07-06 DIAGNOSIS — R931 Abnormal findings on diagnostic imaging of heart and coronary circulation: Secondary | ICD-10-CM

## 2024-07-06 DIAGNOSIS — I10 Essential (primary) hypertension: Secondary | ICD-10-CM | POA: Diagnosis not present

## 2024-07-06 DIAGNOSIS — E7849 Other hyperlipidemia: Secondary | ICD-10-CM

## 2024-07-06 DIAGNOSIS — E1169 Type 2 diabetes mellitus with other specified complication: Secondary | ICD-10-CM

## 2024-07-06 DIAGNOSIS — Z8249 Family history of ischemic heart disease and other diseases of the circulatory system: Secondary | ICD-10-CM

## 2024-07-06 MED ORDER — EZETIMIBE 10 MG PO TABS: 10.0000 mg | ORAL_TABLET | Freq: Every day | ORAL | 3 refills | Status: AC

## 2024-07-06 MED ORDER — ROSUVASTATIN CALCIUM 5 MG PO TABS: ORAL_TABLET | ORAL | 3 refills | Status: AC

## 2024-07-06 MED ORDER — LISINOPRIL 10 MG PO TABS: 10.0000 mg | ORAL_TABLET | Freq: Every day | ORAL | 1 refills | Status: AC

## 2024-07-06 NOTE — Progress Notes (Signed)
 Patient ID: Kelly Perez, female  DOB: 05-01-1966, 58 y.o.   MRN: [id number] Patient Care Team    Relationship Specialty Notifications Start End  Catherine Charlies LABOR, DO PCP - General Family Medicine  07/06/21   Eda Iha, MD (Inactive) Consulting Physician Gastroenterology  07/08/21   [place], My [place], [place] Referring Physician Optometry  07/08/21   Verdon Louann BIRCH, MD Consulting Physician Bariatrics  07/08/21     Chief Complaint  Patient presents with   Follow-up    24 week follow up. Pt declined flu vaccine.    Subjective: Kelly Perez is a 58 y.o.  Female  present for  Chronic Conditions/illness Management All past medical history, surgical history, allergies, family history, immunizations, medications and social history were updated in the electronic medical record today. All recent labs, ED visits and hospitalizations within the last year were reviewed.  Hyperlipidemia associated with type 2 diabetes mellitus (HCC) Diabetes has been managed by her weight management team. She currently is prescribed Rybelsus  14 mg every day. Reports she has not been able to tolerate metformin .  Essential hypertension/lipidemia/morbid obesity/family history of heart disease Pt reports compliance with bisoprolol  10 mg daily and Zieta. Patient denies chest pain, shortness of breath, dizziness or lower extremity edema.  Pt is not taking a daily baby ASA. Pt is not prescribed statin (declined - does not like idea of statin). She is a patient of weight management. RF: Hypertension, hyperlipidemia, obesity, diabetes, family history of heart disease    01/19/2024   10:37 AM 07/06/2023    2:11 PM 01/13/2023    7:48 AM 01/06/2022    8:49 AM 07/06/2021    9:56 AM  Depression screen PHQ 2/9  Decreased Interest 1 1 0 1 0  Down, Depressed, Hopeless 0 0 0 1 0  PHQ - 2 Score 1 1 0 2 0  Altered sleeping 1      Tired, decreased energy 1      Change in appetite 0      Feeling bad or failure about yourself  0       Trouble concentrating 0      Moving slowly or fidgety/restless 0      Suicidal thoughts 0      PHQ-9 Score 3      Difficult doing work/chores Somewhat difficult          01/19/2024   10:37 AM 01/26/2018   10:43 AM  GAD 7 : Generalized Anxiety Score  Nervous, Anxious, on Edge 1 1  Control/stop worrying 1 1  Worry too much - different things 1 1  Trouble relaxing 1 1  Restless 0 1  Easily annoyed or irritable 0 2  Afraid - awful might happen 0 1  Total GAD 7 Score 4 8  Anxiety Difficulty Somewhat difficult Somewhat difficult    Immunization History  Administered Date(s) Administered   Influenza,inj,Quad PF,6+ Mos 06/22/2018   PFIZER(Purple Top)SARS-COV-2 Vaccination 02/11/2020, 03/03/2020   PNEUMOCOCCAL CONJUGATE-20 01/06/2022   Tdap 10/25/2014    Past Medical History:  Diagnosis Date   Adenomatous polyp of ascending colon 10/04/2022   Adenomatous polyp of sigmoid colon 10/04/2022   Allergy    Anemia    Anxiety    Arthritis    Bilateral primary osteoarthritis of knee 01/26/2018   Celiac disease 01/26/2018   Chronic depression 01/26/2018   Complication of anesthesia    woke up during a few times   Constipation    Diabetes mellitus without complication (HCC)  In chart   Food allergy    Gallbladder problem    GERD (gastroesophageal reflux disease)    Hypertension    Morbid obesity (HCC) 01/26/2018   Osteoarthritis    PTSD (post-traumatic stress disorder)    Sleep apnea    cpap   Telangiectasia 06/11/2022   Transaminitis 10/14/2020   Vitamin B12 deficiency    due to celiac   Vitamin D  deficiency    Allergies  Allergen Reactions   Neosporin [Neomycin-Bacitracin Zn-Polymyx] Rash   Tetracycline Anaphylaxis   Other Other (See Comments)    Glutin celiac disease   Neomycin Rash    blistering   Past Surgical History:  Procedure Laterality Date   CHOLECYSTECTOMY  2015   COLONOSCOPY WITH PROPOFOL  N/A 10/04/2022   Procedure: COLONOSCOPY WITH PROPOFOL ;   Surgeon: Eda Iha, MD;  Location: [facility];  Service: Gastroenterology;  Laterality: N/A;   POLYPECTOMY  10/04/2022   Procedure: POLYPECTOMY;  Surgeon: Eda Iha, MD;  Location: [facility];  Service: Gastroenterology;;   SINUS IRRIGATION     Family History  Problem Relation Age of Onset   Bipolar disorder Mother    Diabetes Mother    Hypertension Mother    Heart disease Mother    Depression Mother    Obesity Mother    COPD Father    Depression Father    Early death Father    Heart disease Father    Kidney disease Father    Cardiomyopathy Father    Arthritis Father    Hypertension Father    Bipolar disorder Sister    Diabetes Brother    Heart disease Brother    Diabetes Maternal Grandmother    Bipolar disorder Maternal Grandfather    Diabetes Paternal Grandmother    Diabetes Paternal Grandfather    Hypertension Daughter    Depression Daughter    Diabetes Daughter    Rectal cancer Maternal Aunt    Stomach cancer Neg Hx    Liver disease Neg Hx    Colon cancer Neg Hx    Pancreatic cancer Neg Hx    Esophageal cancer Neg Hx    Social History   Social History Narrative   Originally from [place] ; lived in [place] x 2 years prior to moving to [place] in 2018 to be near only daughter and grandkids.   Lives alone with great Dane. Divorced.    Education/employment: Masters degree.  Employed as a Warden/ranger.   Safety:      -smoke alarm in the home:Yes     - wears seatbelt: Yes     - Feels safe in their relationships: Yes       Allergies as of 07/06/2024       Reactions   Neosporin [neomycin-bacitracin Zn-polymyx] Rash   Tetracycline Anaphylaxis   Other Other (See Comments)   Glutin celiac disease   Neomycin Rash   blistering        Medication List        Accurate as of July 06, 2024  3:52 PM. If you have any questions, ask your nurse or doctor.          STOP taking these medications    bisoprolol  10 MG tablet Commonly  known as: ZEBETA  Stopped by: Charlies Bellini       TAKE these medications    cetirizine 10 MG tablet Commonly known as: ZYRTEC Take 10 mg by mouth daily.   ezetimibe  10 MG tablet Commonly known as: Zetia  Take 1 tablet (10 mg  total) by mouth at bedtime.   Iron 325 (65 Fe) MG Tabs Take 650 mg by mouth daily.   lisinopril  10 MG tablet Commonly known as: ZESTRIL  Take 1 tablet (10 mg total) by mouth daily. Started by: Charlies Bellini   Magnesium 400 MG Tabs Take 400 mg by mouth daily.   rosuvastatin  5 MG tablet Commonly known as: Crestor  1 tab p.o. before bed Mondays and Thursdays   Rybelsus  14 MG Tabs Generic drug: Semaglutide  Take 1 tablet (14 mg total) by mouth daily.   SUPER ENZYMES PO Take 1 tablet by mouth daily.   Turmeric 500 MG Caps Take 1,000 mg by mouth daily.   Vitamin D  (Ergocalciferol ) 1.25 MG (50000 UNIT) Caps capsule Commonly known as: DRISDOL  Take 1 capsule (50,000 Units total) by mouth every 7 (seven) days.   Zinc 50 MG Tabs Take 50 mg by mouth daily.        All past medical history, surgical history, allergies, family history, immunizations andmedications were updated in the EMR today and reviewed under the history and medication portions of their EMR.       ROS 14 pt review of systems performed and negative (unless mentioned in an HPI)  Objective: BP 130/80   Pulse 69   Temp 98.2 F (36.8 C) (Temporal)   Wt (!) 328 lb 12.8 oz (149.1 kg)   LMP 02/13/2018   SpO2 94%   BMI 53.88 kg/m  Physical Exam Vitals and nursing note reviewed.  Constitutional:      General: She is not in acute distress.    Appearance: Normal appearance. She is not ill-appearing, toxic-appearing or diaphoretic.  HENT:     Head: Normocephalic and atraumatic.  Eyes:     General: No scleral icterus.       Right eye: No discharge.        Left eye: No discharge.     Extraocular Movements: Extraocular movements intact.     Conjunctiva/sclera: Conjunctivae normal.      Pupils: Pupils are equal, round, and reactive to light.  Cardiovascular:     Rate and Rhythm: Normal rate and regular rhythm.  Pulmonary:     Effort: Pulmonary effort is normal. No respiratory distress.     Breath sounds: Normal breath sounds. No wheezing, rhonchi or rales.  Musculoskeletal:     Right lower leg: No edema.     Left lower leg: No edema.  Skin:    General: Skin is warm.     Findings: No rash.  Neurological:     Mental Status: She is alert and oriented to person, place, and time. Mental status is at baseline.     Motor: No weakness.     Gait: Gait normal.  Psychiatric:        Mood and Affect: Mood normal.        Behavior: Behavior normal.        Thought Content: Thought content normal.        Judgment: Judgment normal.     Diabetic Foot Exam - Simple   Simple Foot Form Diabetic Foot exam was performed with the following findings: Yes 07/06/2024  3:50 PM  Visual Inspection No deformities, no ulcerations, no other skin breakdown bilaterally: Yes Sensation Testing Intact to touch and monofilament testing bilaterally: Yes Pulse Check Posterior Tibialis and Dorsalis pulse intact bilaterally: Yes Comments     No results found.  Assessment/plan: Kelly Perez is a 58 y.o. female present for chronic Conditions/illness Management Hyperlipidemia associated with type  2 diabetes mellitus (HCC) Diabetes has been managed by her weight management team. Rybelsus  14 mg > prescribed by Dr. Verdon A1c 6.5> 6.1> 6.2> 6.2> 6.4>5.9>5.9>6.0>6.1 >6.0>5.9 - Urine Microalbumin w/creat. Ratio completed 01/19/2024 UTD - FOOT: exam completed 07/06/2024 UTD - Eye: [facility] at [facility]. 11/2024 UTD  Essential hypertension/lipidemia/morbid obesity/family history of heart disease Above goal. Stop bisoprolol  10 mg daily. Start lisinopril  5-10 mg-she will taper accordingly to her blood pressure. Continue Zetia  10 mg daily  Reviewed cmp and lipids collected June 2025 by another  provider Cardiac CT (02/2024): Score 201, 95th percentile.>  Patient agreeable to start Crestor  low-dose 2 times a week for cardiovascular protection.  She has declined statin in the past and would like to refrain from taking daily.  Cholesterol now less than 100 LDL. He also has discussed the start of Repatha, through her cardiology team and they plan to hopefully start it next month.  Vitamin D  deficiency Stable lab UTD Continue supplement  Vitamin B12 deficiency Continue supplement.  Lab up-to-date   Return in about 24 weeks (around 12/21/2024).  No orders of the defined types were placed in this encounter.   Meds ordered this encounter  Medications   rosuvastatin  (CRESTOR ) 5 MG tablet    Sig: 1 tab p.o. before bed Mondays and Thursdays    Dispense:  24 tablet    Refill:  3   ezetimibe  (ZETIA ) 10 MG tablet    Sig: Take 1 tablet (10 mg total) by mouth at bedtime.    Dispense:  90 tablet    Refill:  3   lisinopril  (ZESTRIL ) 10 MG tablet    Sig: Take 1 tablet (10 mg total) by mouth daily.    Dispense:  90 tablet    Refill:  1   Referral Orders  No referral(s) requested today     Electronically signed by: Charlies Bellini, DO [facility]

## 2024-07-06 NOTE — Patient Instructions (Addendum)
 Return in about 24 weeks (around 12/21/2024).  Stop beta blocker> start lisinopril  1/2 tab daily. If BP > 130/80 then increase to a full tab.         Great to see you today.  I have refilled the medication(s) we provide.   If labs were collected or images ordered, we will inform you of  results once we have received them and reviewed. We will contact you either by echart message, or telephone call.  Please give ample time to the testing facility, and our office to run,  receive and review results. Please do not call inquiring of results, even if you can see them in your chart. We will contact you as soon as we are able. If it has been over 1 week since the test was completed, and you have not yet heard from us , then please call us .    - echart message- for normal results that have been seen by the patient already.   - telephone call: abnormal results or if patient has not viewed results in their echart.  If a referral to a specialist was entered for you, please call us  in 2 weeks if you have not heard from the specialist office to schedule.

## 2024-07-18 ENCOUNTER — Ambulatory Visit (INDEPENDENT_AMBULATORY_CARE_PROVIDER_SITE_OTHER): Admitting: Family Medicine

## 2024-07-18 ENCOUNTER — Encounter (INDEPENDENT_AMBULATORY_CARE_PROVIDER_SITE_OTHER): Payer: Self-pay | Admitting: Family Medicine

## 2024-07-18 VITALS — BP 114/75 | HR 87 | Temp 98.3°F | Ht 65.5 in | Wt 325.0 lb

## 2024-07-18 DIAGNOSIS — Z6841 Body Mass Index (BMI) 40.0 and over, adult: Secondary | ICD-10-CM

## 2024-07-18 DIAGNOSIS — E1169 Type 2 diabetes mellitus with other specified complication: Secondary | ICD-10-CM | POA: Diagnosis not present

## 2024-07-18 DIAGNOSIS — I10 Essential (primary) hypertension: Secondary | ICD-10-CM | POA: Diagnosis not present

## 2024-07-18 DIAGNOSIS — E785 Hyperlipidemia, unspecified: Secondary | ICD-10-CM | POA: Diagnosis not present

## 2024-07-18 DIAGNOSIS — M170 Bilateral primary osteoarthritis of knee: Secondary | ICD-10-CM

## 2024-07-18 DIAGNOSIS — Z7985 Long-term (current) use of injectable non-insulin antidiabetic drugs: Secondary | ICD-10-CM

## 2024-07-18 DIAGNOSIS — K900 Celiac disease: Secondary | ICD-10-CM | POA: Diagnosis not present

## 2024-07-18 DIAGNOSIS — E7849 Other hyperlipidemia: Secondary | ICD-10-CM

## 2024-07-18 DIAGNOSIS — K219 Gastro-esophageal reflux disease without esophagitis: Secondary | ICD-10-CM

## 2024-07-18 DIAGNOSIS — E559 Vitamin D deficiency, unspecified: Secondary | ICD-10-CM

## 2024-07-18 DIAGNOSIS — Z7984 Long term (current) use of oral hypoglycemic drugs: Secondary | ICD-10-CM

## 2024-07-18 DIAGNOSIS — E669 Obesity, unspecified: Secondary | ICD-10-CM

## 2024-07-18 MED ORDER — TIRZEPATIDE 10 MG/0.5ML ~~LOC~~ SOAJ
10.0000 mg | SUBCUTANEOUS | 0 refills | Status: DC
Start: 2024-07-18 — End: 2024-09-12

## 2024-07-19 NOTE — Progress Notes (Signed)
 Office: [phone]  /  Fax: [phone]  WEIGHT SUMMARY AND BIOMETRICS  Anthropometric Measurements Height: 5' 5.5 (1.664 m) Weight: (!) 325 lb (147.4 kg) BMI (Calculated): 53.24 Weight at Last Visit: 325 lb Weight Lost Since Last Visit: 0 Weight Gained Since Last Visit: 0 Starting Weight: 372 lb Total Weight Loss (lbs): 47 lb (21.3 kg) Peak Weight: 372 lb   Body Composition  Body Fat %: 58.7 % Fat Mass (lbs): 190.8 lbs Muscle Mass (lbs): 127.4 lbs Visceral Fat Rating : 24   Other Clinical Data Fasting: no Labs: no Today's Visit #: 58 Starting Date: 06/17/20    Chief Complaint: OBESITY    History of Present Illness Kelly Perez is a 58 year old female with obesity and celiac disease who presents for a follow-up on her obesity treatment plan.  She is adhering to a low-carb diet plan approximately 85% of the time to manage her obesity and celiac disease. Her diet includes whole foods such as fruits and vegetables, adequate protein intake, and she maintains hydration. She avoids skipping meals and ensures 7 to 9 hours of sleep per night. She has successfully maintained her weight loss over the past month, with a total loss of 47 pounds since starting the plan.  For physical activity, she walks her dog for 15 minutes daily and engages in strengthening exercises twice a week for 60 minutes each session. She has been attending [employer] for small group training twice a week for the past 6 to 8 weeks, and does different exercises than others because of her knee issues. No significant pain or back issues are reported, except for knee soreness after using an air bike.  She manages her celiac disease primarily through diet, using gluten-free products such as gluten-free jerky, and has found suitable hydration options like Trevia and [employer] electrolyte powder, which are gluten-free and use Stevia.  She was recently switched from a beta blocker to lisinopril   for blood pressure management. She has a history of high blood pressure, which spiked during a gallbladder surgery. She is also on Rybelsus  for blood sugar management and has maintained her current weight for a while, hoping to gain muscle mass through her exercise regimen.  No recent travel plans that might disrupt her healthy eating habits until January. She is planning a trip to [place] in January and is preparing for it by improving her physical endurance.      PHYSICAL EXAM:  Blood pressure 114/75, pulse 87, temperature 98.3 F (36.8 C), height 5' 5.5 (1.664 m), weight (!) 325 lb (147.4 kg), last menstrual period 02/13/2018, SpO2 96%. Body mass index is 53.26 kg/m.  DIAGNOSTIC DATA REVIEWED:  BMET    Component Value Date/Time   NA 140 04/02/2024 0817   K 5.0 04/02/2024 0817   CL 102 04/02/2024 0817   CO2 20 04/02/2024 0817   GLUCOSE 103 (H) 04/02/2024 0817   GLUCOSE 93 01/26/2018 1052   BUN 17 04/02/2024 0817   CREATININE 0.68 04/02/2024 0817   CALCIUM  9.7 04/02/2024 0817   GFRNONAA 100 09/29/2020 1058   GFRAA 115 09/29/2020 1058   Lab Results  Component Value Date   HGBA1C 5.9 (H) 04/02/2024   HGBA1C 6.4 (H) 06/17/2020   Lab Results  Component Value Date   INSULIN  22.1 04/02/2024   INSULIN  27.4 (H) 06/17/2020   Lab Results  Component Value Date   TSH 1.940 04/02/2024   CBC    Component Value Date/Time   WBC 8.1 04/02/2024 0817  WBC 8.7 01/11/2023 0924   RBC 4.72 04/02/2024 0817   RBC 4.95 01/11/2023 0924   HGB 13.6 04/02/2024 0817   HCT 41.8 04/02/2024 0817   PLT 422 04/02/2024 0817   MCV 89 04/02/2024 0817   MCH 28.8 04/02/2024 0817   MCHC 32.5 04/02/2024 0817   MCHC 33.3 01/11/2023 0924   RDW 13.0 04/02/2024 0817   Iron Studies    Component Value Date/Time   IRON 82 01/26/2018 1052   TIBC 391 01/26/2018 1052   FERRITIN 35 01/26/2018 1052   IRONPCTSAT 21 01/26/2018 1052   Lipid Panel     Component Value Date/Time   CHOL 157 04/02/2024  0817   CHOL 239 (H) 04/14/2021 1524   TRIG 102 04/02/2024 0817   TRIG 157 (H) 04/14/2021 1524   HDL 39 (L) 04/02/2024 0817   CHOLHDL 5 01/11/2023 0924   VLDL 32.4 01/11/2023 0924   LDLCALC 99 04/02/2024 0817   Hepatic Function Panel     Component Value Date/Time   PROT 6.9 04/02/2024 0817   ALBUMIN 4.3 04/02/2024 0817   AST 13 04/02/2024 0817   ALT 12 04/02/2024 0817   ALKPHOS 88 04/02/2024 0817   BILITOT 0.2 04/02/2024 0817      Component Value Date/Time   TSH 1.940 04/02/2024 0817   Nutritional Lab Results  Component Value Date   VD25OH 75.6 04/02/2024   VD25OH 60.6 09/29/2023   VD25OH 60.1 08/11/2023     Assessment and Plan Assessment & Plan Obesity in the setting of type 2 diabetes mellitus Obesity management is ongoing with a low-carb diet, exercise, and Rybelsus . Weight loss of 47 pounds achieved. Considering transition from Rybelsus  to Mounjaro  for potentially better weight loss and glycemic control. Insurance coverage for Mounjaro  is a concern, but prior authorization will be pursued. Mounjaro  may offer better outcomes with less side effects compared to Rybelsus . Current regimen includes walking and strength training, which is improving physical fitness and endurance. - Continue low-carb diet and exercise regimen. - Initiate prior authorization process for Mounjaro . - Continue Rybelsus  until Mounjaro  is approved. - Start Mounjaro  at 10 mg once approved, with potential dose escalation based on response. - Monitor weight and glycemic control.  Celiac disease Celiac disease is well-controlled with a gluten-free diet. She is incorporating more whole foods, fruits, and vegetables into her diet. She is also using gluten-free products like Trevia and [employer] electrolyte powder. - Continue gluten-free diet. - Encourage incorporation of whole foods, fruits, and vegetables.  Hypertension Hypertension management includes recent switch from a beta blocker to  lisinopril  for better cardiovascular outcomes and renal protection. The change is expected to aid in weight management as beta blockers can contribute to weight gain. - Continue lisinopril  for hypertension management.  Hyperlipidemia Hyperlipidemia management includes upcoming initiation of Repatha injections to further reduce cholesterol levels. Recent EKG was normal, indicating stable cardiovascular status. - Initiate Repatha injections in a couple of weeks.  Gastroesophageal reflux disease (GERD) GERD symptoms are managed with dietary modifications. Spicy foods, such as certain jerky products, are avoided to prevent exacerbation of symptoms. - Continue dietary modifications to manage GERD symptoms.  Osteoarthritis of knees Osteoarthritis of the knees is managed with individualized exercise at [employer], focusing on strength and endurance without exacerbating knee pain. Exercise regimen includes walking and strength training, which has improved physical fitness and reduced pain. - Continue individualized exercise program at [employer].      Cythnia was informed of the importance of frequent follow up  visits to maximize her success with intensive lifestyle modifications for her obesity and obesity related health conditions as recommended by USPSTF and CMS guidelines   Louann Penton, MD

## 2024-07-24 ENCOUNTER — Telehealth (INDEPENDENT_AMBULATORY_CARE_PROVIDER_SITE_OTHER): Payer: Self-pay

## 2024-07-24 NOTE — Telephone Encounter (Signed)
 Prior authorization has been submitted for Mounjaro  10 mg, for T2DM.

## 2024-07-26 NOTE — Telephone Encounter (Signed)
 Your request has been approved CaseId:[id number];Status:Approved;Review Type:Prior Auth;Coverage Start Date:06/24/2024;Coverage End Date:07/24/2025;  Patient notified via my chart.

## 2024-07-29 NOTE — Progress Notes (Unsigned)
 Patient ID: Kelly Perez                 DOB: 10/11/1966                    MRN: [id number]      HPI: Kelly Perez is a 58 y.o. female patient referred to lipid clinic by Dr. Jeffrie. PMH is significant for HLD, CAD (CAC score of 201), HTN, DM, and OSA.   Patient presents in good spirits today. She is currently taking ezetimibe  10 mg daily and rosuvastatin  5 mg twice per week, unable to tolerate rosuvastatin  daily due to muscle pains. She reports tolerating current HLD regimen well without any side effects.   Patient reports that she is followed [facility] ([facility] where she just started Mounjaro . She reports taking her first injection a couple of days ago and tolerating Mounjaro  well so far.   Reviewed options for lowering LDL cholesterol, including PCSK-9 inhibitors, and inclisiran.  Discussed mechanisms of action, dosing, side effects and potential decreases in LDL cholesterol.  Also reviewed cost information and potential options for patient assistance. Additionally, discussed follow up labs. She states that she receives labs every 3 months at Dr. Larae office and will have follow up lipid panel drawn there.  Current Medications: ezetimibe  10 mg daily, rosuvastatin  5 mg two times per week Intolerances: unable to tolerate rosuvastatin  daily, has not tried any other statins Risk Factors: CAD, DM,  obesity, family history of heart disease, HTN LDL goal: <70  Lipid panel (03/2024): Chol 157, Trig 102, LDL 99, HDL 39  Diet: Patient has celiac disease Diet consists of high protein (usually baked) Snacks: mozarella cheese sticks, blueberries, strawberries Drinks: green tea, water w/ electrolyte and stevia  Exercise: Walks dog everyday  Strength training 2x/week for 1 hour  She plans to start walking on the treadmill    Family History:  Relation Problem Comments  Mother - X (Deceased at age 9) Bipolar disorder   Depression   Diabetes   Heart disease    Hypertension   Obesity     Father - X (Deceased at age 60) Arthritis   COPD   Cardiomyopathy   Depression   Early death   Heart disease   Hypertension   Kidney disease     Sister Metallurgist) Bipolar disorder     Brother - half Metallurgist) Diabetes   Heart disease     Maternal Aunt Metallurgist) Rectal cancer     Maternal Grandmother - X (Deceased) Diabetes     Maternal Grandfather (Deceased) Bipolar disorder     Paternal Grandmother - X (Deceased) Diabetes     Paternal Grandfather - X (Deceased) Diabetes     Daughter - X (Alive) Depression   Diabetes   Hypertension     Neg Hx Colon cancer   Esophageal cancer   Liver disease   Pancreatic cancer   Stomach cancer       Social History:  Alcohol: none Tobacco: none   Labs:  Lipid Panel     Component Value Date/Time   CHOL 157 04/02/2024 0817   CHOL 239 (H) 04/14/2021 1524   TRIG 102 04/02/2024 0817   TRIG 157 (H) 04/14/2021 1524   HDL 39 (L) 04/02/2024 0817   CHOLHDL 5 01/11/2023 0924   VLDL 32.4 01/11/2023 0924   LDLCALC 99 04/02/2024 0817   LABVLDL 19 04/02/2024 0817    Past Medical History:  Diagnosis Date   Adenomatous polyp  of ascending colon 10/04/2022   Adenomatous polyp of sigmoid colon 10/04/2022   Allergy    Anemia    Anxiety    Arthritis    Bilateral primary osteoarthritis of knee 01/26/2018   Celiac disease 01/26/2018   Chronic depression 01/26/2018   Complication of anesthesia    woke up during a few times   Constipation    Diabetes mellitus without complication (HCC)    In chart   Food allergy    Gallbladder problem    GERD (gastroesophageal reflux disease)    Hypertension    Morbid obesity (HCC) 01/26/2018   Osteoarthritis    PTSD (post-traumatic stress disorder)    Sleep apnea    cpap   Telangiectasia 06/11/2022   Transaminitis 10/14/2020   Vitamin B12 deficiency    due to celiac   Vitamin D  deficiency     Current Outpatient Medications on File Prior to Visit  Medication Sig  Dispense Refill   cetirizine (ZYRTEC) 10 MG tablet Take 10 mg by mouth daily.     Digestive Enzymes (SUPER ENZYMES PO) Take 1 tablet by mouth daily.     ezetimibe  (ZETIA ) 10 MG tablet Take 1 tablet (10 mg total) by mouth at bedtime. 90 tablet 3   Ferrous Sulfate (IRON) 325 (65 Fe) MG TABS Take 650 mg by mouth daily.     lisinopril  (ZESTRIL ) 10 MG tablet Take 1 tablet (10 mg total) by mouth daily. 90 tablet 1   Magnesium 400 MG TABS Take 400 mg by mouth daily.     rosuvastatin  (CRESTOR ) 5 MG tablet 1 tab p.o. before bed Mondays and Thursdays 24 tablet 3   Semaglutide  (RYBELSUS ) 14 MG TABS Take 1 tablet (14 mg total) by mouth daily. 90 tablet 0   tirzepatide  (MOUNJARO ) 10 MG/0.5ML Pen Inject 10 mg into the skin once a week. 6 mL 0   Turmeric 500 MG CAPS Take 1,000 mg by mouth daily.     Vitamin D , Ergocalciferol , (DRISDOL ) 1.25 MG (50000 UNIT) CAPS capsule Take 1 capsule (50,000 Units total) by mouth every 7 (seven) days. 13 capsule 0   Zinc 50 MG TABS Take 50 mg by mouth daily.     No current facility-administered medications on file prior to visit.    Allergies  Allergen Reactions   Neosporin [Neomycin-Bacitracin Zn-Polymyx] Rash   Tetracycline Anaphylaxis   Other Other (See Comments)    Glutin celiac disease   Neomycin Rash    blistering    Assessment/Plan:  1. Hyperlipidemia -  Problem  Other Hyperlipidemia   Other hyperlipidemia Assessment:  LDL goal: < 70 mg/dl; last LDLc 99 mg/dl (93/7974) Tolerates Zetia  10 mg daily and rosuvastatin  5 mg twice weekly well without any side effects  Discussed next potential options (PCSK-9 inhibitors and inclisiran); cost, dosing efficacy, side effects   Plan: Continue taking current medications (ezetimibe  10 mg daily, rosuvastatin  5 mg two times per week) Will apply for PA for PCSK9i; will inform patient upon approval  Lipid panel will be due in 2-3 months after PCSK9i; pt will have lipid panel drawn at Dr. Larae office     Dorsey E. White, Pharm.D [facility] Kelly Perez. [facility] [address], [place], [place] [address] Phone: [phone]; Fax: [phone]    I was with patient and Dr. Teresa for visit and agree with above assessment and plan.  Lillion Elbert PharmD [facility]

## 2024-07-30 ENCOUNTER — Other Ambulatory Visit (INDEPENDENT_AMBULATORY_CARE_PROVIDER_SITE_OTHER): Payer: Self-pay | Admitting: Family Medicine

## 2024-07-30 ENCOUNTER — Encounter (HOSPITAL_BASED_OUTPATIENT_CLINIC_OR_DEPARTMENT_OTHER): Payer: Self-pay | Admitting: Pharmacist Clinician (PhC)/ Clinical Pharmacy Specialist

## 2024-07-30 ENCOUNTER — Telehealth (HOSPITAL_BASED_OUTPATIENT_CLINIC_OR_DEPARTMENT_OTHER): Payer: Self-pay

## 2024-07-30 ENCOUNTER — Ambulatory Visit (INDEPENDENT_AMBULATORY_CARE_PROVIDER_SITE_OTHER): Admitting: Pharmacist Clinician (PhC)/ Clinical Pharmacy Specialist

## 2024-07-30 VITALS — BP 136/92 | HR 96 | Ht 65.5 in | Wt 328.4 lb

## 2024-07-30 DIAGNOSIS — E7849 Other hyperlipidemia: Secondary | ICD-10-CM

## 2024-07-30 DIAGNOSIS — E559 Vitamin D deficiency, unspecified: Secondary | ICD-10-CM

## 2024-07-30 DIAGNOSIS — E1169 Type 2 diabetes mellitus with other specified complication: Secondary | ICD-10-CM

## 2024-07-30 NOTE — Assessment & Plan Note (Addendum)
 Assessment:  LDL goal: < 70 mg/dl; last LDLc 99 mg/dl (93/7974) Tolerates Zetia  10 mg daily and rosuvastatin  5 mg twice weekly well without any side effects  Discussed next potential options (PCSK-9 inhibitors and inclisiran); cost, dosing efficacy, side effects   Plan: Continue taking current medications (ezetimibe  10 mg daily, rosuvastatin  5 mg two times per week) Will apply for PA for PCSK9i; will inform patient upon approval  Lipid panel will be due in 2-3 months after PCSK9i; pt will have lipid panel drawn at Dr. Larae office

## 2024-07-30 NOTE — Patient Instructions (Signed)
 Your Results:             Your most recent labs Goal  Total Cholesterol 157 < 200  Triglycerides 102 < 150  HDL (happy/good cholesterol) 39 > 40  LDL (lousy/bad cholesterol 99 < 70   Medication changes: - Continue ezetimibe  10 mg daily, rosuvastatin  5 mg twice per week   We will start the process to get Repatha covered by your insurance.  Once the prior authorization is complete, we will call you to let you know and confirm pharmacy information.      Repatha is a cholesterol medication that improved your body's ability to get rid of bad cholesterol known as LDL. It can lower your LDL up to 60%! It is an injection that is given under the skin every 2 weeks. The medication often requires a prior authorization from your insurance company. We will take care of submitting all the necessary information to your insurance company to get it approved. The most common side effects of Repatha include runny nose, symptoms of the common cold, rarely flu or flu-like symptoms, back/muscle pain in about 3-4% of the patients, and redness, pain, or bruising at the injection site.  Lab orders: We want to repeat labs after 2-3 months.  We will monitor your fasting lipid panel that you receive from Dr. Verdon.

## 2024-07-31 ENCOUNTER — Other Ambulatory Visit (HOSPITAL_COMMUNITY): Payer: Self-pay

## 2024-07-31 MED ORDER — REPATHA SURECLICK 140 MG/ML ~~LOC~~ SOAJ: 140.0000 mg | SUBCUTANEOUS | 2 refills | Status: DC

## 2024-07-31 NOTE — Telephone Encounter (Signed)
 Pharmacy Patient Advocate Encounter   Received notification from Physician's Office that prior authorization for REPATHA is required/requested.   Insurance verification completed.     Per test claim: The current 28 day co-pay is, $0.  No PA needed at this time. This test claim was processed through [facility]- copay amounts may vary at other pharmacies due to pharmacy/plan contracts, or as the patient moves through the different stages of their insurance plan.

## 2024-08-15 ENCOUNTER — Encounter (INDEPENDENT_AMBULATORY_CARE_PROVIDER_SITE_OTHER): Payer: Self-pay | Admitting: Family Medicine

## 2024-08-15 ENCOUNTER — Ambulatory Visit (INDEPENDENT_AMBULATORY_CARE_PROVIDER_SITE_OTHER): Admitting: Family Medicine

## 2024-08-15 VITALS — BP 116/68 | HR 93 | Temp 98.0°F | Ht 65.5 in | Wt 318.0 lb

## 2024-08-15 DIAGNOSIS — I10 Essential (primary) hypertension: Secondary | ICD-10-CM | POA: Diagnosis not present

## 2024-08-15 DIAGNOSIS — E7849 Other hyperlipidemia: Secondary | ICD-10-CM

## 2024-08-15 DIAGNOSIS — E669 Obesity, unspecified: Secondary | ICD-10-CM

## 2024-08-15 DIAGNOSIS — E785 Hyperlipidemia, unspecified: Secondary | ICD-10-CM | POA: Diagnosis not present

## 2024-08-15 DIAGNOSIS — Z6841 Body Mass Index (BMI) 40.0 and over, adult: Secondary | ICD-10-CM

## 2024-08-15 DIAGNOSIS — K900 Celiac disease: Secondary | ICD-10-CM | POA: Diagnosis not present

## 2024-08-15 DIAGNOSIS — E1169 Type 2 diabetes mellitus with other specified complication: Secondary | ICD-10-CM

## 2024-08-15 DIAGNOSIS — E119 Type 2 diabetes mellitus without complications: Secondary | ICD-10-CM

## 2024-08-15 DIAGNOSIS — E559 Vitamin D deficiency, unspecified: Secondary | ICD-10-CM

## 2024-08-15 DIAGNOSIS — Z7985 Long-term (current) use of injectable non-insulin antidiabetic drugs: Secondary | ICD-10-CM

## 2024-08-15 MED ORDER — VITAMIN D (ERGOCALCIFEROL) 1.25 MG (50000 UNIT) PO CAPS: 50000.0000 [IU] | ORAL_CAPSULE | ORAL | 0 refills | Status: DC

## 2024-08-15 NOTE — Progress Notes (Signed)
 Office: [phone]  /  Fax: [phone]  WEIGHT SUMMARY AND BIOMETRICS  Anthropometric Measurements Height: 5' 5.5 (1.664 m) Weight: (!) 318 lb (144.2 kg) BMI (Calculated): 52.09 Weight at Last Visit: 325 lb Weight Lost Since Last Visit: 7 lb Weight Gained Since Last Visit: 0 Starting Weight: 372 lb Total Weight Loss (lbs): 54 lb (24.5 kg) Peak Weight: 372 lb   Body Composition  Body Fat %: 57.7 % Fat Mass (lbs): 183.8 lbs Muscle Mass (lbs): 127.8 lbs Total Body Water (lbs): 99 lbs Visceral Fat Rating : 23   Other Clinical Data Fasting: no Labs: no Today's Visit #: 54 Starting Date: 06/17/20    Chief Complaint: OBESITY    History of Present Illness Kelly Perez is a 58 year old female with obesity, celiac disease, and type 2 diabetes who presents for obesity treatment and progress assessment.  She is adhering to a low-carb eating plan 85% of the time, resulting in a seven-pound weight loss over the past month. She has reduced snacking, which has helped her overcome a weight loss plateau. She engages in physical activities, including walking up and down stairs and participating in fitness sessions twice a week, which have improved her strength and knee condition.  She has celiac disease and finds that the low-carb plan aligns well with her dietary needs.  She has type 2 diabetes and is currently on Mounjaro  10 mg weekly. She experiences no nausea or other side effects from the medication, which she has been taking for three weeks. She is also on Repatha and has switched from bisoprolol  to lisinopril  to aid in exercise tolerance.  She is planning a trip to [place] in December and working on increasing her activity so she can enjoy her trip as much as possible      PHYSICAL EXAM:  Blood pressure 116/68, pulse 93, temperature 98 F (36.7 C), height 5' 5.5 (1.664 m), weight (!) 318 lb (144.2 kg), last menstrual period 02/13/2018, SpO2 97%. Body mass index is  52.11 kg/m.  DIAGNOSTIC DATA REVIEWED:  BMET    Component Value Date/Time   NA 140 04/02/2024 0817   K 5.0 04/02/2024 0817   CL 102 04/02/2024 0817   CO2 20 04/02/2024 0817   GLUCOSE 103 (H) 04/02/2024 0817   GLUCOSE 93 01/26/2018 1052   BUN 17 04/02/2024 0817   CREATININE 0.68 04/02/2024 0817   CALCIUM  9.7 04/02/2024 0817   GFRNONAA 100 09/29/2020 1058   GFRAA 115 09/29/2020 1058   Lab Results  Component Value Date   HGBA1C 5.9 (H) 04/02/2024   HGBA1C 6.4 (H) 06/17/2020   Lab Results  Component Value Date   INSULIN  22.1 04/02/2024   INSULIN  27.4 (H) 06/17/2020   Lab Results  Component Value Date   TSH 1.940 04/02/2024   CBC    Component Value Date/Time   WBC 8.1 04/02/2024 0817   WBC 8.7 01/11/2023 0924   RBC 4.72 04/02/2024 0817   RBC 4.95 01/11/2023 0924   HGB 13.6 04/02/2024 0817   HCT 41.8 04/02/2024 0817   PLT 422 04/02/2024 0817   MCV 89 04/02/2024 0817   MCH 28.8 04/02/2024 0817   MCHC 32.5 04/02/2024 0817   MCHC 33.3 01/11/2023 0924   RDW 13.0 04/02/2024 0817   Iron Studies    Component Value Date/Time   IRON 82 01/26/2018 1052   TIBC 391 01/26/2018 1052   FERRITIN 35 01/26/2018 1052   IRONPCTSAT 21 01/26/2018 1052   Lipid Panel  Component Value Date/Time   CHOL 157 04/02/2024 0817   CHOL 239 (H) 04/14/2021 1524   TRIG 102 04/02/2024 0817   TRIG 157 (H) 04/14/2021 1524   HDL 39 (L) 04/02/2024 0817   CHOLHDL 5 01/11/2023 0924   VLDL 32.4 01/11/2023 0924   LDLCALC 99 04/02/2024 0817   Hepatic Function Panel     Component Value Date/Time   PROT 6.9 04/02/2024 0817   ALBUMIN 4.3 04/02/2024 0817   AST 13 04/02/2024 0817   ALT 12 04/02/2024 0817   ALKPHOS 88 04/02/2024 0817   BILITOT 0.2 04/02/2024 0817      Component Value Date/Time   TSH 1.940 04/02/2024 0817   Nutritional Lab Results  Component Value Date   VD25OH 75.6 04/02/2024   VD25OH 60.6 09/29/2023   VD25OH 60.1 08/11/2023     Assessment and Plan Assessment &  Plan Obesity with body mass index (BMI) 50.0-59.9, adult Obesity management is ongoing with a low carb eating plan, followed 85% of the time. A weight loss of 7 pounds in the last month indicates progress, breaking a weight loss plateau due to reduced snacking and increased physical activity, including strength training and walking exercises. - Continue low carb eating plan - Encourage continued physical activity, including strength training and walking exercises  Type 2 diabetes mellitus Type 2 diabetes is managed with Mounjaro  10 mg weekly. She reports no adverse effects such as nausea, and this is her third week on the medication. The medication was prescribed for three months, and she is tolerating it well. The plan is to assess medication tolerance and potential dose adjustment before the next refill. - Continue Mounjaro  10 mg weekly - Plan to assess medication tolerance and potential dose adjustment before next refill  Celiac disease Celiac disease is managed with a low carb eating plan, which is suitable for her condition. - Continue low carb eating plan  Essential hypertension Hypertension is well-controlled with a switch from bisoprolol  to lisinopril , resulting in a blood pressure reading of 116/68 mmHg. The switch was made to facilitate increased heart rate during exercise. - Continue lisinopril  for blood pressure management  Hyperlipidemia Hyperlipidemia management includes the recent initiation of Repatha. She has completed one dose without any adverse effects. There is a plan to monitor lipid levels and potentially discontinue Zetia  if lipid control is adequate. - Continue Repatha - Consider discontinuing Zetia  based on lipid control  Follow-Up Follow-up plans include scheduling labs before the next visit to monitor diabetes and hyperlipidemia management. She is planning a trip to [place] and will need to ensure medication refills are managed before departure. - Order labs,  including urine microalbumin, before next visit - Schedule follow-up visit in November - Plan for medication refills before December trip     [name] was informed of the importance of frequent follow up visits to maximize her success with intensive lifestyle modifications for her obesity and obesity related health conditions as recommended by USPSTF and CMS guidelines   Louann Penton, MD

## 2024-08-18 ENCOUNTER — Other Ambulatory Visit: Payer: Self-pay | Admitting: Family Medicine

## 2024-09-03 ENCOUNTER — Encounter (INDEPENDENT_AMBULATORY_CARE_PROVIDER_SITE_OTHER): Payer: Self-pay | Admitting: Family Medicine

## 2024-09-07 LAB — CBC WITH DIFFERENTIAL/PLATELET
Basophils Absolute: 0.1 x10E3/uL (ref 0.0–0.2)
Basos: 1 %
EOS (ABSOLUTE): 0.4 x10E3/uL (ref 0.0–0.4)
Eos: 5 %
Hematocrit: 42.6 % (ref 34.0–46.6)
Hemoglobin: 13.8 g/dL (ref 11.1–15.9)
Immature Grans (Abs): 0 x10E3/uL (ref 0.0–0.1)
Immature Granulocytes: 0 %
Lymphocytes Absolute: 2.7 x10E3/uL (ref 0.7–3.1)
Lymphs: 30 %
MCH: 29.1 pg (ref 26.6–33.0)
MCHC: 32.4 g/dL (ref 31.5–35.7)
MCV: 90 fL (ref 79–97)
Monocytes Absolute: 0.9 x10E3/uL (ref 0.1–0.9)
Monocytes: 10 %
Neutrophils Absolute: 4.7 x10E3/uL (ref 1.4–7.0)
Neutrophils: 53 %
Platelets: 495 x10E3/uL — ABNORMAL HIGH (ref 150–450)
RBC: 4.75 x10E6/uL (ref 3.77–5.28)
RDW: 12.9 % (ref 11.7–15.4)
WBC: 8.8 x10E3/uL (ref 3.4–10.8)

## 2024-09-07 LAB — CMP14+EGFR
ALT: 17 IU/L (ref 0–32)
AST: 14 IU/L (ref 0–40)
Albumin: 4.3 g/dL (ref 3.8–4.9)
Alkaline Phosphatase: 101 IU/L (ref 49–135)
BUN/Creatinine Ratio: 21 (ref 9–23)
BUN: 15 mg/dL (ref 6–24)
Bilirubin Total: 0.4 mg/dL (ref 0.0–1.2)
CO2: 22 mmol/L (ref 20–29)
Calcium: 9.5 mg/dL (ref 8.7–10.2)
Chloride: 99 mmol/L (ref 96–106)
Creatinine, Ser: 0.71 mg/dL (ref 0.57–1.00)
Globulin, Total: 2.6 g/dL (ref 1.5–4.5)
Glucose: 100 mg/dL — ABNORMAL HIGH (ref 70–99)
Potassium: 4.9 mmol/L (ref 3.5–5.2)
Sodium: 135 mmol/L (ref 134–144)
Total Protein: 6.9 g/dL (ref 6.0–8.5)
eGFR: 98 mL/min/1.73 (ref >59)

## 2024-09-07 LAB — HEMOGLOBIN A1C
Est. average glucose Bld gHb Est-mCnc: 123 mg/dL
Hgb A1c MFr Bld: 5.9 % — ABNORMAL HIGH (ref 4.8–5.6)

## 2024-09-07 LAB — INSULIN, RANDOM: INSULIN: 20.6 u[IU]/mL (ref 2.6–24.9)

## 2024-09-07 LAB — VITAMIN D 25 HYDROXY (VIT D DEFICIENCY, FRACTURES): Vit D, 25-Hydroxy: 84 ng/mL (ref 30.0–100.0)

## 2024-09-07 LAB — LIPID PANEL WITH LDL/HDL RATIO
Cholesterol, Total: 128 mg/dL (ref 100–199)
HDL: 49 mg/dL (ref >39)
LDL Chol Calc (NIH): 61 mg/dL (ref 0–99)
LDL/HDL Ratio: 1.2 ratio (ref 0.0–3.2)
Triglycerides: 92 mg/dL (ref 0–149)
VLDL Cholesterol Cal: 18 mg/dL (ref 5–40)

## 2024-09-07 LAB — MICROALBUMIN / CREATININE URINE RATIO
Creatinine, Urine: 37 mg/dL
Microalb/Creat Ratio: 8 mg/g{creat} (ref 0–29)
Microalbumin, Urine: 3 ug/mL

## 2024-09-07 LAB — VITAMIN B12: Vitamin B-12: 1943 pg/mL — ABNORMAL HIGH (ref 232–1245)

## 2024-09-07 LAB — TSH: TSH: 2.46 u[IU]/mL (ref 0.450–4.500)

## 2024-09-10 ENCOUNTER — Ambulatory Visit (INDEPENDENT_AMBULATORY_CARE_PROVIDER_SITE_OTHER): Admitting: Family Medicine

## 2024-09-10 ENCOUNTER — Encounter (INDEPENDENT_AMBULATORY_CARE_PROVIDER_SITE_OTHER): Payer: Self-pay | Admitting: Family Medicine

## 2024-09-10 VITALS — BP 118/79 | HR 86 | Temp 98.1°F | Wt 323.0 lb

## 2024-09-10 DIAGNOSIS — R319 Hematuria, unspecified: Secondary | ICD-10-CM

## 2024-09-10 DIAGNOSIS — R350 Frequency of micturition: Secondary | ICD-10-CM

## 2024-09-10 LAB — POC URINALSYSI DIPSTICK (AUTOMATED)
Bilirubin, UA: NEGATIVE
Glucose, UA: NEGATIVE
Ketones, UA: NEGATIVE
Nitrite, UA: NEGATIVE
Protein, UA: NEGATIVE
Spec Grav, UA: 1.01 (ref 1.010–1.025)
Urobilinogen, UA: NEGATIVE U/dL — AB
pH, UA: 8 (ref 5.0–8.0)

## 2024-09-10 MED ORDER — CEPHALEXIN 500 MG PO CAPS: 500.0000 mg | ORAL_CAPSULE | Freq: Three times a day (TID) | ORAL | 0 refills | Status: DC

## 2024-09-10 NOTE — Patient Instructions (Signed)
 Return in about 2 weeks (around 09/24/2024), or if symptoms worsen or fail to improve, for UTI.        Great to see you today.  I have refilled the medication(s) we provide.   If labs were collected or images ordered, we will inform you of  results once we have received them and reviewed. We will contact you either by echart message, or telephone call.  Please give ample time to the testing facility, and our office to run,  receive and review results. Please do not call inquiring of results, even if you can see them in your chart. We will contact you as soon as we are able. If it has been over 1 week since the test was completed, and you have not yet heard from us , then please call us .    - echart message- for normal results that have been seen by the patient already.   - telephone call: abnormal results or if patient has not viewed results in their echart.  If a referral to a specialist was entered for you, please call us  in 2 weeks if you have not heard from the specialist office to schedule.

## 2024-09-10 NOTE — Progress Notes (Signed)
 Kelly Perez , 1966/02/12, 58 y.o., female MRN: [id number] Patient Care Team    Relationship Specialty Notifications Start End  Kelly Perez LABOR, DO PCP - General Family Medicine  07/06/21   Eda Iha, MD (Inactive) Consulting Physician Gastroenterology  07/08/21   [place], [place], [place] Referring Physician Optometry  07/08/21   Verdon Louann BIRCH, MD Consulting Physician Bariatrics  07/08/21     Chief Complaint  Patient presents with   Urinary Frequency    Over a week; Urinary frequency, dysuria. Home UTI test was positive.      Subjective: Kelly Perez is a 58 y.o. Pt presents for an OV with complaints of persistent urinary frequency after UTI.  Patient reports she had a UTI symptoms with hematuria on 09/11/2024, seen at [facility] and was treated with Macrobid of 5 days duration.  Since completing the antibiotics patient reports the initial symptoms feel much improved, but the urinary frequency returned. She denies fevers, chills, nausea or vomiting.  She reports she has not had a UTI in a couple decades.  He does not have a history of kidney stones.     01/19/2024   10:37 AM 07/06/2023    2:11 PM 01/13/2023    7:48 AM 01/06/2022    8:49 AM 07/06/2021    9:56 AM  Depression screen PHQ 2/9  Decreased Interest 1 1 0 1 0  Down, Depressed, Hopeless 0 0 0 1 0  PHQ - 2 Score 1 1 0 2 0  Altered sleeping 1      Tired, decreased energy 1      Change in appetite 0      Feeling bad or failure about yourself  0      Trouble concentrating 0      Moving slowly or fidgety/restless 0      Suicidal thoughts 0      PHQ-9 Score 3       Difficult doing work/chores Somewhat difficult         Data saved with a previous flowsheet row definition    Allergies  Allergen Reactions   Neosporin [Neomycin-Bacitracin Zn-Polymyx] Rash   Tetracycline Anaphylaxis   Other Other (See Comments)    Glutin celiac disease   Neomycin Rash    blistering   Social History   Social History Narrative    Originally from [place] ; lived in [place] x 2 years prior to moving to [place] in 2018 to be near only daughter and grandkids.   Lives alone with great Dane. Divorced.    Education/employment: Masters degree.  Employed as a warden/ranger.   Safety:      -smoke alarm in the home:Yes     - wears seatbelt: Yes     - Feels safe in their relationships: Yes      Past Medical History:  Diagnosis Date   Adenomatous polyp of ascending colon 10/04/2022   Adenomatous polyp of sigmoid colon 10/04/2022   Allergy    Anemia    Anxiety    Arthritis    Bilateral primary osteoarthritis of knee 01/26/2018   Celiac disease 01/26/2018   Chronic depression 01/26/2018   Complication of anesthesia    woke up during a few times   Constipation    Diabetes mellitus without complication (HCC)    In chart   Food allergy    Gallbladder problem    GERD (gastroesophageal reflux disease)    Hypertension    Morbid obesity (HCC) 01/26/2018  Osteoarthritis    PTSD (post-traumatic stress disorder)    Sleep apnea    cpap   Telangiectasia 06/11/2022   Transaminitis 10/14/2020   Vitamin B12 deficiency    due to celiac   Vitamin D  deficiency    Past Surgical History:  Procedure Laterality Date   CHOLECYSTECTOMY  2015   COLONOSCOPY WITH PROPOFOL  N/A 10/04/2022   Procedure: COLONOSCOPY WITH PROPOFOL ;  Surgeon: Eda Iha, MD;  Location: [facility];  Service: Gastroenterology;  Laterality: N/A;   POLYPECTOMY  10/04/2022   Procedure: POLYPECTOMY;  Surgeon: Eda Iha, MD;  Location: [facility];  Service: Gastroenterology;;   SINUS IRRIGATION     Family History  Problem Relation Age of Onset   Bipolar disorder Mother    Diabetes Mother    Hypertension Mother    Heart disease Mother    Depression Mother    Obesity Mother    COPD Father    Depression Father    Early death Father    Heart disease Father    Kidney disease Father    Cardiomyopathy Father    Arthritis Father     Hypertension Father    Bipolar disorder Sister    Diabetes Brother    Heart disease Brother    Diabetes Maternal Grandmother    Bipolar disorder Maternal Grandfather    Diabetes Paternal Grandmother    Diabetes Paternal Grandfather    Hypertension Daughter    Depression Daughter    Diabetes Daughter    Rectal cancer Maternal Aunt    Stomach cancer Neg Hx    Liver disease Neg Hx    Colon cancer Neg Hx    Pancreatic cancer Neg Hx    Esophageal cancer Neg Hx    Allergies as of 09/10/2024       Reactions   Neosporin [neomycin-bacitracin Zn-polymyx] Rash   Tetracycline Anaphylaxis   Other Other (See Comments)   Glutin celiac disease   Neomycin Rash   blistering        Medication List        Accurate as of September 10, 2024  3:05 PM. If you have any questions, ask your nurse or doctor.          cephALEXin 500 MG capsule Commonly known as: KEFLEX Take 1 capsule (500 mg total) by mouth every 8 (eight) hours. Started by: Perez Bellini   cetirizine 10 MG tablet Commonly known as: ZYRTEC Take 10 mg by mouth daily.   ezetimibe  10 MG tablet Commonly known as: Zetia  Take 1 tablet (10 mg total) by mouth at bedtime.   Iron 325 (65 Fe) MG Tabs Take 650 mg by mouth daily.   lisinopril  10 MG tablet Commonly known as: ZESTRIL  Take 1 tablet (10 mg total) by mouth daily.   Magnesium 400 MG Tabs Take 400 mg by mouth daily.   Repatha SureClick 140 MG/ML Soaj Generic drug: Evolocumab Inject 140 mg into the skin every 14 (fourteen) days.   rosuvastatin  5 MG tablet Commonly known as: Crestor  1 tab p.o. before bed Mondays and Thursdays   SUPER ENZYMES PO Take 1 tablet by mouth daily.   tirzepatide  10 MG/0.5ML Pen Commonly known as: MOUNJARO  Inject 10 mg into the skin once a week.   Turmeric 500 MG Caps Take 1,000 mg by mouth daily.   Vitamin D  (Ergocalciferol ) 1.25 MG (50000 UNIT) Caps capsule Commonly known as: DRISDOL  Take 1 capsule (50,000 Units total) by  mouth every 7 (seven) days.   Zinc 50 MG Tabs  Take 50 mg by mouth daily.        All past medical history, surgical history, allergies, family history, immunizations andmedications were updated in the EMR today and reviewed under the history and medication portions of their EMR.     Review of Systems  Constitutional:  Positive for malaise/fatigue. Negative for chills and fever.  Respiratory: Negative.    Cardiovascular: Negative.   Genitourinary:  Positive for frequency, hematuria and urgency.  Skin:  Negative for rash.  Neurological: Negative.   Endo/Heme/Allergies: Negative.   Psychiatric/Behavioral: Negative.     Negative, with the exception of above mentioned in HPI   Objective:  BP 118/79   Pulse 86   Temp 98.1 F (36.7 C)   Wt (!) 323 lb (146.5 kg)   LMP 02/13/2018   SpO2 98%   BMI 52.93 kg/m  Body mass index is 52.93 kg/m. Physical Exam Vitals and nursing note reviewed.  Constitutional:      General: She is not in acute distress.    Appearance: Normal appearance. She is normal weight. She is not ill-appearing or toxic-appearing.  HENT:     Head: Normocephalic and atraumatic.  Eyes:     General: No scleral icterus.       Right eye: No discharge.        Left eye: No discharge.     Extraocular Movements: Extraocular movements intact.     Conjunctiva/sclera: Conjunctivae normal.     Pupils: Pupils are equal, round, and reactive to light.  Abdominal:     General: There is no distension.     Palpations: There is no mass.     Tenderness: There is no abdominal tenderness. There is no right CVA tenderness, left CVA tenderness, guarding or rebound.  Skin:    Findings: No rash.  Neurological:     Mental Status: She is alert and oriented to person, place, and time. Mental status is at baseline.     Motor: No weakness.     Coordination: Coordination normal.     Gait: Gait normal.  Psychiatric:        Mood and Affect: Mood normal.        Behavior: Behavior  normal.        Thought Content: Thought content normal.        Judgment: Judgment normal.    No results found. No results found. Results for orders placed or performed in visit on 09/10/24 (from the past 24 hours)  POCT Urinalysis Dipstick (Automated)     Status: Abnormal   Collection Time: 09/10/24  3:02 PM  Result Value Ref Range   Color, UA yellow    Clarity, UA clear    Glucose, UA Negative Negative   Bilirubin, UA negative    Ketones, UA negative    Spec Grav, UA 1.010 1.010 - 1.025   Blood, UA 1+    pH, UA 8.0 5.0 - 8.0   Protein, UA Negative Negative   Urobilinogen, UA negative (A) 0.2 or 1.0 E.U./dL   Nitrite, UA Negative    Leukocytes, UA Small (1+) (A) Negative    Assessment/Plan: Mavi Un is a 58 y.o. female present for OV for  Hematuria/urinary frequency Point-of-care urine> blood +1, leuks +-clearly yellow Urine culture> pending. Rest.  Hydrate well. Start Keflex 3 times daily while we wait on culture results   Reviewed expectations re: course of current medical issues. Discussed self-management of symptoms. Outlined signs and symptoms indicating need for more acute intervention. Patient verbalized  understanding and all questions were answered. Patient received an After-Visit Summary.    Orders Placed This Encounter  Procedures   Urinalysis w microscopic + reflex cultur   POCT Urinalysis Dipstick (Automated)   Meds ordered this encounter  Medications   cephALEXin (KEFLEX) 500 MG capsule    Sig: Take 1 capsule (500 mg total) by mouth every 8 (eight) hours.    Dispense:  21 capsule    Refill:  0   Referral Orders  No referral(s) requested today     Note is dictated utilizing voice recognition software. Although note has been proof read prior to signing, occasional typographical errors still can be missed. If any questions arise, please do not hesitate to call for verification.   electronically signed by:  Perez Bellini, DO  [facility] [place]

## 2024-09-12 ENCOUNTER — Encounter (INDEPENDENT_AMBULATORY_CARE_PROVIDER_SITE_OTHER): Payer: Self-pay | Admitting: Family Medicine

## 2024-09-12 ENCOUNTER — Ambulatory Visit (INDEPENDENT_AMBULATORY_CARE_PROVIDER_SITE_OTHER): Payer: Self-pay | Admitting: Family Medicine

## 2024-09-12 VITALS — BP 104/68 | HR 83 | Temp 98.1°F | Ht 65.5 in | Wt 315.0 lb

## 2024-09-12 DIAGNOSIS — E559 Vitamin D deficiency, unspecified: Secondary | ICD-10-CM

## 2024-09-12 DIAGNOSIS — Z7985 Long-term (current) use of injectable non-insulin antidiabetic drugs: Secondary | ICD-10-CM

## 2024-09-12 DIAGNOSIS — I10 Essential (primary) hypertension: Secondary | ICD-10-CM | POA: Diagnosis not present

## 2024-09-12 DIAGNOSIS — Z6841 Body Mass Index (BMI) 40.0 and over, adult: Secondary | ICD-10-CM

## 2024-09-12 DIAGNOSIS — E119 Type 2 diabetes mellitus without complications: Secondary | ICD-10-CM | POA: Diagnosis not present

## 2024-09-12 DIAGNOSIS — K900 Celiac disease: Secondary | ICD-10-CM

## 2024-09-12 DIAGNOSIS — E669 Obesity, unspecified: Secondary | ICD-10-CM

## 2024-09-12 MED ORDER — TIRZEPATIDE 10 MG/0.5ML ~~LOC~~ SOAJ: 10.0000 mg | SUBCUTANEOUS | 0 refills | Status: DC

## 2024-09-12 MED ORDER — VITAMIN D (ERGOCALCIFEROL) 1.25 MG (50000 UNIT) PO CAPS: 50000.0000 [IU] | ORAL_CAPSULE | ORAL | 0 refills | Status: DC

## 2024-09-12 NOTE — Progress Notes (Signed)
 Office: [phone]  /  Fax: [phone]  WEIGHT SUMMARY AND BIOMETRICS  Anthropometric Measurements Height: 5' 5.5 (1.664 m) Weight: (!) 315 lb (142.9 kg) BMI (Calculated): 51.6 Weight at Last Visit: 318 lb Weight Lost Since Last Visit: 3 lb Weight Gained Since Last Visit: 0 Starting Weight: 372 lb Total Weight Loss (lbs): 57 lb (25.9 kg) Peak Weight: 372 lb   Body Composition  Body Fat %: 57.4 % Fat Mass (lbs): 181 lbs Muscle Mass (lbs): 127.4 lbs Total Body Water (lbs): 98.2 lbs Visceral Fat Rating : 23   Other Clinical Data Fasting: no Labs: no Today's Visit #: 55 Starting Date: 06/17/20    Chief Complaint: OBESITY    History of Present Illness Kelly Perez is a 58 year old female with obesity and type 2 diabetes who presents for obesity treatment and progress assessment.  She adheres to a low-carb eating plan approximately 85% of the time and engages in physical activities such as walking her dogs, physical therapy, and strengthening exercises for about 60 minutes daily. She has lost three pounds in the last month.  Her type 2 diabetes is stable on Mounjaro  10 mg weekly, with an A1c of 5.9% and fasting glucose at 100 mg/dL. She requests a refill for her medication.  She manages a vitamin D  deficiency with prescription ergocalciferol  50,000 IU weekly, with a current vitamin D  level of 84 ng/mL. She requests a refill.  She is on lisinopril  10 mg daily for hypertension and is working on improving her blood pressure with diet, exercise, and weight loss.  She experienced a recent urinary tract infection with significant bleeding, initially treated with an unspecified antibiotic via [facility], and later with cephalexin due to persistent symptoms. She has not had a UTI in approximately 30 years.  She has celiac disease and follows a strict gluten-free diet, reporting no recent issues. She avoids eating out and prepares her own meals to manage her condition.  She  is concerned about potential yeast infections due to antibiotic use, especially with upcoming travel to [place] and [place]. She takes Culturelle probiotics to help manage this risk.      PHYSICAL EXAM:  Blood pressure 104/68, pulse 83, temperature 98.1 F (36.7 C), height 5' 5.5 (1.664 m), weight (!) 315 lb (142.9 kg), last menstrual period 02/13/2018, SpO2 97%. Body mass index is 51.62 kg/m.  DIAGNOSTIC DATA REVIEWED:  BMET    Component Value Date/Time   NA 135 09/06/2024 0749   K 4.9 09/06/2024 0749   CL 99 09/06/2024 0749   CO2 22 09/06/2024 0749   GLUCOSE 100 (H) 09/06/2024 0749   GLUCOSE 93 01/26/2018 1052   BUN 15 09/06/2024 0749   CREATININE 0.71 09/06/2024 0749   CALCIUM  9.5 09/06/2024 0749   GFRNONAA 100 09/29/2020 1058   GFRAA 115 09/29/2020 1058   Lab Results  Component Value Date   HGBA1C 5.9 (H) 09/06/2024   HGBA1C 6.4 (H) 06/17/2020   Lab Results  Component Value Date   INSULIN  20.6 09/06/2024   INSULIN  27.4 (H) 06/17/2020   Lab Results  Component Value Date   TSH 2.460 09/06/2024   CBC    Component Value Date/Time   WBC 8.8 09/06/2024 0749   WBC 8.7 01/11/2023 0924   RBC 4.75 09/06/2024 0749   RBC 4.95 01/11/2023 0924   HGB 13.8 09/06/2024 0749   HCT 42.6 09/06/2024 0749   PLT 495 (H) 09/06/2024 0749   MCV 90 09/06/2024 0749   MCH 29.1 09/06/2024  0749   MCHC 32.4 09/06/2024 0749   MCHC 33.3 01/11/2023 0924   RDW 12.9 09/06/2024 0749   Iron Studies    Component Value Date/Time   IRON 82 01/26/2018 1052   TIBC 391 01/26/2018 1052   FERRITIN 35 01/26/2018 1052   IRONPCTSAT 21 01/26/2018 1052   Lipid Panel     Component Value Date/Time   CHOL 128 09/06/2024 0749   CHOL 239 (H) 04/14/2021 1524   TRIG 92 09/06/2024 0749   TRIG 157 (H) 04/14/2021 1524   HDL 49 09/06/2024 0749   CHOLHDL 5 01/11/2023 0924   VLDL 32.4 01/11/2023 0924   LDLCALC 61 09/06/2024 0749   Hepatic Function Panel     Component Value Date/Time   PROT 6.9  09/06/2024 0749   ALBUMIN 4.3 09/06/2024 0749   AST 14 09/06/2024 0749   ALT 17 09/06/2024 0749   ALKPHOS 101 09/06/2024 0749   BILITOT 0.4 09/06/2024 0749      Component Value Date/Time   TSH 2.460 09/06/2024 0749   Nutritional Lab Results  Component Value Date   VD25OH 84.0 09/06/2024   VD25OH 75.6 04/02/2024   VD25OH 60.6 09/29/2023     Assessment and Plan Assessment & Plan Obesity  She has lost three pounds in the last month, with two pounds being fat and one pound being water. She is following a low-carb eating plan 85% of the time and engaging in physical activities, including walking her dogs, physical therapy, and home exercises for 60 minutes daily. She is preparing for upcoming travel and has strategies in place to maintain her dietary plan. - Continue low-carb eating plan - Continue physical activities, including walking and physical therapy  Type 2 diabetes mellitus Type 2 diabetes is well-controlled with an A1c of 5.9. She is on Mounjaro  10 mg weekly and reports doing well on this regimen. The A1c is stable, and there is no expectation of complications at this level. - Continue Mounjaro  10 mg weekly - Refilled Mounjaro  prescription - Continue diet, exercise and weight loss as discussed today as an important part of the treatment plan  Vitamin D  deficiency Managed with prescription ergocalciferol  50,000 IU weekly. Her vitamin D  level is at 84, indicating adequate control. - Continue ergocalciferol  50,000 IU weekly - Refilled ergocalciferol  prescription  Essential hypertension Well-controlled with lisinopril  10 mg daily. Her blood pressure is at 104/68, indicating good management through diet, exercise, and weight loss. - Continue lisinopril  10 mg daily - Continue diet, exercise and weight loss as discussed today as an important part of the treatment plan  Celiac disease Well-managed with a gluten-free diet. She reports no issues and avoids eating out to prevent  accidental gluten exposure. - - Continue gluten-free diet - Avoid eating out to prevent gluten exposure      Patients who are on anti-obesity medications are counseled on the importance of maintaining healthy lifestyle habits, including balanced nutrition, regular physical activity, and behavioral modifications,  Medication is an adjunct to, not a replacement for, lifestyle changes and that the long-term success and weight maintenance depend on continued adherence to these strategies.   Shelbee was informed of the importance of frequent follow up visits to maximize her success with intensive lifestyle modifications for her obesity and obesity related health conditions as recommended by USPSTF and CMS guidelines  Louann Penton, MD

## 2024-09-13 ENCOUNTER — Ambulatory Visit: Payer: Self-pay | Admitting: Family Medicine

## 2024-09-13 LAB — URINALYSIS W MICROSCOPIC + REFLEX CULTURE
Bacteria, UA: NONE SEEN /HPF
Bilirubin Urine: NEGATIVE
Glucose, UA: NEGATIVE
Hyaline Cast: NONE SEEN /LPF
Ketones, ur: NEGATIVE
Nitrites, Initial: NEGATIVE
Protein, ur: NEGATIVE
Specific Gravity, Urine: 1.007 (ref 1.001–1.035)
pH: 8.5 — ABNORMAL HIGH (ref 5.0–8.0)

## 2024-09-13 LAB — URINE CULTURE
MICRO NUMBER:: 17247817
SPECIMEN QUALITY:: ADEQUATE

## 2024-09-13 LAB — CULTURE INDICATED

## 2024-09-20 ENCOUNTER — Encounter: Payer: Self-pay | Admitting: Family Medicine

## 2024-09-24 ENCOUNTER — Ambulatory Visit: Payer: Self-pay

## 2024-09-24 NOTE — Telephone Encounter (Signed)
 Pt our of state, scheduled for soonest she is available. Advised to contact 911 if it occurs again. Pt verbalized understanding.  FYI Only or Action Required?: FYI only for provider: appointment scheduled on 09/26/24.  Patient was last seen in primary care on 09/12/2024 by Kelly Louann BIRCH, MD.  Called Nurse Triage reporting Heart Problem/resolved tachycardia.  Symptoms began several days ago.  Interventions attempted: Rest, hydration, or home remedies.  Symptoms are: completely resolved.  Triage Disposition: Call PCP Within 24 Hours  Patient/caregiver understands and will follow disposition?: No, unable to be seen before 09/26/24 Reason for Disposition  Heart rhythm alert (e.g., you have irregular heartbeat) from personal wearable device (e.g., Apple Watch)  Answer Assessment - Initial Assessment Questions Pt reports palpitations on 09/20/24. On her Garmin, she was having tachycardia and fatigue (178 at it's highest) while walking. States resolved to 85 after one hour. States her normal is 80-90 depending on activity level.   [place] is [place]. Denies HR of >110 since event. No known hx of SVT.   Denies cardiac hx other than some calcifications. Has not had stress test.  1. DESCRIPTION: Please describe your heart rate or heartbeat that you are having (e.g., fast/slow, regular/irregular, skipped or extra beats, palpitations)     Fast  2. ONSET: When did it start? (e.g., minutes, hours, days)      09/20/24 x 1 event  3. DURATION: How long does it last (e.g., seconds, minutes, hours)     One hour  4. PATTERN Does it come and go, or has it been constant since it started?  Does it get worse with exertion?   Are you feeling it now?     Constant, began with exertion   6. HEART RATE: Can you tell me your heart rate? How many beats in 15 seconds?  Note: Not all patients can do this.       178  7. RECURRENT SYMPTOM: Have you ever had this before? If  Yes, ask: When was the last time? and What happened that time?      Denies  8. CAUSE: What do you think is causing the palpitations?     Unknown  9. CARDIAC HISTORY: Do you have any history of heart disease? (e.g., heart attack, angina, bypass surgery, angioplasty, arrhythmia)      Denies other than calcifications that pt reports was seen on EKG  10. OTHER SYMPTOMS: Do you have any other symptoms? (e.g., dizziness, chest pain, sweating, difficulty breathing)       States was fatigued and face was flushed during event  Protocols used: Heart Rate and Heartbeat Questions-A-AH

## 2024-09-24 NOTE — Telephone Encounter (Signed)
Pt has appt 1/23.

## 2024-09-24 NOTE — Telephone Encounter (Signed)
 Please call patient tomorrow morning after she returns from travel and obtain more information and see if she still having any issues.  If she is having issues please schedule her to have an in office appointment for evaluation.

## 2024-09-25 ENCOUNTER — Ambulatory Visit: Admitting: Family Medicine

## 2024-09-26 ENCOUNTER — Ambulatory Visit: Admitting: Family Medicine

## 2024-09-26 ENCOUNTER — Ambulatory Visit: Attending: Family Medicine

## 2024-09-26 VITALS — BP 118/80 | HR 79 | Temp 98.2°F | Wt 316.2 lb

## 2024-09-26 DIAGNOSIS — R000 Tachycardia, unspecified: Secondary | ICD-10-CM

## 2024-09-26 DIAGNOSIS — R0609 Other forms of dyspnea: Secondary | ICD-10-CM | POA: Diagnosis not present

## 2024-09-26 NOTE — Progress Notes (Unsigned)
 EP to read.

## 2024-09-26 NOTE — Progress Notes (Signed)
 Kelly Perez , 07-25-1966, 58 y.o., female MRN: [id number] Patient Care Team    Relationship Specialty Notifications Start End  Catherine Charlies LABOR, DO PCP - General Family Medicine  07/06/21   Eda Iha, MD (Inactive) Consulting Physician Gastroenterology  07/08/21   [place], [place], [place] Referring Physician Optometry  07/08/21   Verdon Louann BIRCH, MD Consulting Physician Bariatrics  07/08/21     Chief Complaint  Patient presents with   Elevated Heart rate    1 week. Highest reading has been 178 while walking. Elevation occurs during movement. Associated sx include feeling winded.      Subjective: Kelly Perez is a 58 y.o. Pt presents for an OV with complaints of elevated heart rate when walking a turkey Trot.  Last week.  Patient reported heart rate up to 178 and she reported taking over an hour of her sitting in order for her to come back down to a normal 85 bpm.  Associated symptoms include feeling short of breath and winded while walking, which resolved after sitting for couple minutes.  She denies chest pain, dizziness, diaphoresis and pain in her left arm/jaw. Body mass index is 51.82 kg/m. She does not typically perform cardiovascular exercise.  She does do weight training 2-3 times a week and walks her dog nightly. She states since she returned from her trip, when walking her dog her heart rate get up to 120-130.  When she is working with weight training her heart rate gets up to the highest of 132. Her heart rate is elevated into the 130s-140s going through the airport each time in which she was walking briskly and stressed.  Patient reports she was mildly dehydrated and it was very cold.  She is prescribed Mounjaro  10 mg weekly injection, Crestor  and Zetia .  She is a diabetic and has an elevated coronary artery calcium  score in the 95th percentile.     09/26/2024   11:33 AM 01/19/2024   10:37 AM 07/06/2023    2:11 PM 01/13/2023    7:48 AM 01/06/2022    8:49 AM  Depression  screen PHQ 2/9  Decreased Interest 0 1 1 0 1  Down, Depressed, Hopeless 0 0 0 0 1  PHQ - 2 Score 0 1 1 0 2  Altered sleeping 0 1     Tired, decreased energy 1 1     Change in appetite 0 0     Feeling bad or failure about yourself  0 0     Trouble concentrating 0 0     Moving slowly or fidgety/restless 0 0     Suicidal thoughts 0 0     PHQ-9 Score 1 3      Difficult doing work/chores Not difficult at all Somewhat difficult        Data saved with a previous flowsheet row definition    Allergies  Allergen Reactions   Neosporin [Neomycin-Bacitracin Zn-Polymyx] Rash   Tetracycline Anaphylaxis   Other Other (See Comments)    Glutin celiac disease   Neomycin Rash    blistering   Social History   Social History Narrative   Originally from [place] ; lived in [place] x 2 years prior to moving to [place] in 2018 to be near only daughter and grandkids.   Lives alone with great Dane. Divorced.    Education/employment: Masters degree.  Employed as a warden/ranger.   Safety:      -smoke alarm in the home:Yes     -  wears seatbelt: Yes     - Feels safe in their relationships: Yes      Past Medical History:  Diagnosis Date   Adenomatous polyp of ascending colon 10/04/2022   Adenomatous polyp of sigmoid colon 10/04/2022   Allergy    Anemia    Anxiety    Arthritis    Bilateral primary osteoarthritis of knee 01/26/2018   Celiac disease 01/26/2018   Chronic depression 01/26/2018   Complication of anesthesia    woke up during a few times   Constipation    Diabetes mellitus without complication (HCC)    In chart   Food allergy    Gallbladder problem    GERD (gastroesophageal reflux disease)    Hypertension    Morbid obesity (HCC) 01/26/2018   Osteoarthritis    PTSD (post-traumatic stress disorder)    Sleep apnea    cpap   Telangiectasia 06/11/2022   Transaminitis 10/14/2020   Vitamin B12 deficiency    due to celiac   Vitamin D  deficiency    Past Surgical History:   Procedure Laterality Date   CHOLECYSTECTOMY  2015   COLONOSCOPY WITH PROPOFOL  N/A 10/04/2022   Procedure: COLONOSCOPY WITH PROPOFOL ;  Surgeon: Eda Iha, MD;  Location: [facility];  Service: Gastroenterology;  Laterality: N/A;   POLYPECTOMY  10/04/2022   Procedure: POLYPECTOMY;  Surgeon: Eda Iha, MD;  Location: [facility];  Service: Gastroenterology;;   SINUS IRRIGATION     Family History  Problem Relation Age of Onset   Bipolar disorder Mother    Diabetes Mother    Hypertension Mother    Heart disease Mother    Depression Mother    Obesity Mother    COPD Father    Depression Father    Early death Father    Heart disease Father    Kidney disease Father    Cardiomyopathy Father    Arthritis Father    Hypertension Father    Bipolar disorder Sister    Diabetes Brother    Heart disease Brother    Diabetes Maternal Grandmother    Bipolar disorder Maternal Grandfather    Diabetes Paternal Grandmother    Diabetes Paternal Grandfather    Hypertension Daughter    Depression Daughter    Diabetes Daughter    Rectal cancer Maternal Aunt    Stomach cancer Neg Hx    Liver disease Neg Hx    Colon cancer Neg Hx    Pancreatic cancer Neg Hx    Esophageal cancer Neg Hx    Allergies as of 09/26/2024       Reactions   Neosporin [neomycin-bacitracin Zn-polymyx] Rash   Tetracycline Anaphylaxis   Other Other (See Comments)   Glutin celiac disease   Neomycin Rash   blistering        Medication List        Accurate as of September 26, 2024  3:54 PM. If you have any questions, ask your nurse or doctor.          STOP taking these medications    cephALEXin  500 MG capsule Commonly known as: KEFLEX  Stopped by: Charlies Bellini       TAKE these medications    cetirizine 10 MG tablet Commonly known as: ZYRTEC Take 10 mg by mouth daily.   ezetimibe  10 MG tablet Commonly known as: Zetia  Take 1 tablet (10 mg total) by mouth at bedtime.   Iron 325 (65  Fe) MG Tabs Take 650 mg by mouth daily.   lisinopril  10 MG  tablet Commonly known as: ZESTRIL  Take 1 tablet (10 mg total) by mouth daily.   Magnesium 400 MG Tabs Take 400 mg by mouth daily.   Repatha  SureClick 140 MG/ML Soaj Generic drug: Evolocumab  Inject 140 mg into the skin every 14 (fourteen) days.   rosuvastatin  5 MG tablet Commonly known as: Crestor  1 tab p.o. before bed Mondays and Thursdays   SUPER ENZYMES PO Take 1 tablet by mouth daily.   tirzepatide  10 MG/0.5ML Pen Commonly known as: MOUNJARO  Inject 10 mg into the skin once a week.   Turmeric 500 MG Caps Take 1,000 mg by mouth daily.   Vitamin D  (Ergocalciferol ) 1.25 MG (50000 UNIT) Caps capsule Commonly known as: DRISDOL  Take 1 capsule (50,000 Units total) by mouth every 7 (seven) days.   Zinc 50 MG Tabs Take 50 mg by mouth daily.        All past medical history, surgical history, allergies, family history, immunizations andmedications were updated in the EMR today and reviewed under the history and medication portions of their EMR.     ROS Negative, with the exception of above mentioned in HPI   Objective:  BP 118/80   Pulse 79   Temp 98.2 F (36.8 C)   Wt (!) 316 lb 3.2 oz (143.4 kg)   LMP 02/13/2018   SpO2 97%   BMI 51.82 kg/m  Body mass index is 51.82 kg/m.  Physical Exam Vitals and nursing note reviewed.  Constitutional:      General: She is not in acute distress.    Appearance: Normal appearance. She is not ill-appearing, toxic-appearing or diaphoretic.  HENT:     Head: Normocephalic and atraumatic.  Eyes:     General: No scleral icterus.       Right eye: No discharge.        Left eye: No discharge.     Extraocular Movements: Extraocular movements intact.     Conjunctiva/sclera: Conjunctivae normal.     Pupils: Pupils are equal, round, and reactive to light.  Cardiovascular:     Rate and Rhythm: Normal rate and regular rhythm.     Heart sounds: No murmur heard. Pulmonary:      Effort: Pulmonary effort is normal. No respiratory distress.     Breath sounds: Normal breath sounds. No wheezing, rhonchi or rales.  Musculoskeletal:     Right lower leg: No edema.     Left lower leg: No edema.  Skin:    General: Skin is warm.     Findings: No rash.  Neurological:     Mental Status: She is alert and oriented to person, place, and time. Mental status is at baseline.     Motor: No weakness.     Gait: Gait normal.  Psychiatric:        Mood and Affect: Mood normal.        Behavior: Behavior normal.        Thought Content: Thought content normal.        Judgment: Judgment normal.      No results found. No results found. No results found for this or any previous visit (from the past 24 hours).  Assessment/Plan: Kelly Perez is a 58 y.o. female present for OV for  Tachycardia (Primary)/dyspnea on exertion Patient denies having any chest pain during her tachycardic.  Suspect her symptoms is multifactorial secondary to deconditioning, dehydration, breathing in the cold air and stress.  Heart rate returned to normal once resting and was able to relax.  Shortness  of breath resolved very quickly once able to sit. We discussed Zio monitoring and she is agreeable to this today. With her elevated CAC score, would encourage her to also consider follow-up with her cardiology team concerning symptoms if she has any chest pain, worsening shortness of breath with exertion or near atypical symptoms of CAD. - LONG TERM MONITOR (3-14 DAYS); Future   Reviewed expectations re: course of current medical issues. Discussed self-management of symptoms. Outlined signs and symptoms indicating need for more acute intervention. Patient verbalized understanding and all questions were answered. Patient received an After-Visit Summary.    Orders Placed This Encounter  Procedures   LONG TERM MONITOR (3-14 DAYS)   No orders of the defined types were placed in this encounter.  Referral  Orders  No referral(s) requested today     Note is dictated utilizing voice recognition software. Although note has been proof read prior to signing, occasional typographical errors still can be missed. If any questions arise, please do not hesitate to call for verification.   electronically signed by:  Charlies Bellini, DO  [facility] - [place]

## 2024-09-26 NOTE — Patient Instructions (Signed)

## 2024-10-01 ENCOUNTER — Telehealth: Payer: Self-pay

## 2024-10-01 DIAGNOSIS — E7849 Other hyperlipidemia: Secondary | ICD-10-CM

## 2024-10-01 MED ORDER — REPATHA SURECLICK 140 MG/ML ~~LOC~~ SOAJ: 140.0000 mg | SUBCUTANEOUS | 2 refills | Status: AC

## 2024-10-01 NOTE — Telephone Encounter (Signed)
 NR - Left voicemail and authorized to leave a detailed message per DPR. Recent lipid panel indicates LDL is at goal following initiation of Repatha . Advised to continue current hyperlipidemia regimen as prescribed

## 2024-10-01 NOTE — Addendum Note (Signed)
 Addended by: Jennessy Sandridge E on: 10/01/2024 02:26 PM   Modules accepted: Orders

## 2024-10-22 ENCOUNTER — Ambulatory Visit: Payer: Self-pay

## 2024-10-22 ENCOUNTER — Ambulatory Visit: Payer: Self-pay | Admitting: Family Medicine

## 2024-10-22 ENCOUNTER — Encounter (INDEPENDENT_AMBULATORY_CARE_PROVIDER_SITE_OTHER): Payer: Self-pay | Admitting: Family Medicine

## 2024-10-22 ENCOUNTER — Ambulatory Visit (INDEPENDENT_AMBULATORY_CARE_PROVIDER_SITE_OTHER): Payer: Self-pay | Admitting: Family Medicine

## 2024-10-22 DIAGNOSIS — Z7985 Long-term (current) use of injectable non-insulin antidiabetic drugs: Secondary | ICD-10-CM

## 2024-10-22 DIAGNOSIS — K900 Celiac disease: Secondary | ICD-10-CM | POA: Diagnosis not present

## 2024-10-22 DIAGNOSIS — I471 Supraventricular tachycardia, unspecified: Secondary | ICD-10-CM | POA: Insufficient documentation

## 2024-10-22 DIAGNOSIS — E559 Vitamin D deficiency, unspecified: Secondary | ICD-10-CM | POA: Diagnosis not present

## 2024-10-22 DIAGNOSIS — R000 Tachycardia, unspecified: Secondary | ICD-10-CM | POA: Diagnosis not present

## 2024-10-22 DIAGNOSIS — R0609 Other forms of dyspnea: Secondary | ICD-10-CM

## 2024-10-22 DIAGNOSIS — Z6841 Body Mass Index (BMI) 40.0 and over, adult: Secondary | ICD-10-CM | POA: Diagnosis not present

## 2024-10-22 DIAGNOSIS — M170 Bilateral primary osteoarthritis of knee: Secondary | ICD-10-CM | POA: Diagnosis not present

## 2024-10-22 DIAGNOSIS — E119 Type 2 diabetes mellitus without complications: Secondary | ICD-10-CM

## 2024-10-22 DIAGNOSIS — E669 Obesity, unspecified: Secondary | ICD-10-CM | POA: Diagnosis not present

## 2024-10-22 DIAGNOSIS — I472 Ventricular tachycardia, unspecified: Secondary | ICD-10-CM | POA: Insufficient documentation

## 2024-10-22 MED ORDER — TIRZEPATIDE 10 MG/0.5ML ~~LOC~~ SOAJ: 10.0000 mg | SUBCUTANEOUS | 0 refills | Status: AC

## 2024-10-22 MED ORDER — METOPROLOL SUCCINATE ER 25 MG PO TB24: 25.0000 mg | ORAL_TABLET | Freq: Every day | ORAL | 0 refills | Status: AC

## 2024-10-22 MED ORDER — VITAMIN D (ERGOCALCIFEROL) 1.25 MG (50000 UNIT) PO CAPS: 50000.0000 [IU] | ORAL_CAPSULE | ORAL | 0 refills | Status: AC

## 2024-10-22 NOTE — Telephone Encounter (Signed)
 Zio monitor results showed an average range heart rate of 92. Highest heart rate 207-this occurred during an episode of an arrhythmia called SVT and/or VT. There were 29 recorded episodes in 2 weeks of irregular heart rate, with the longest lasting 28 beats. There were no episodes of atrial fibrillation There were no sustained arrhythmias longer than 28 beats.  The amount of events that occurred in 14 days is concerning, as well as the heart rate above 200 and episodes of VT.  I have referred her back to her cardiology team for further evaluation urgently.  I also called in a medication called [facility]oprolol to her [facility] [place] for her to start today.  This medication helps control heart rate.  I would recommend any recurrent events with any shortness of breath, fatigue or chest pain she should be seen in the emergency room-if they occur before her cardiology appointment.   If she would like to discuss this further with his provider before getting into cardiology, I would be happy to see her.  If she prefers she can do a virtual.

## 2024-10-22 NOTE — Telephone Encounter (Signed)
 FYI Only or Action Required?: Action required by provider: clinical question for provider. Pt wanting to know if it's safe to fly based on Zio patch results.  Flight leaves at 10 am tomorrow morning, boards at 9:30 am and wont be home until January 11th. Please call.  Patient was last seen in primary care on 10/22/2024 by Verdon Louann BIRCH, MD.  Called Nurse Triage reporting Palpitations (/).  Symptoms began about a month ago.  Interventions attempted: Prescription medications: Lisinopril , Metoprolol.  Symptoms are: stable.  Triage Disposition: Call PCP Within 24 Hours  Patient/caregiver understands and will follow disposition?: Yes     CRM # [id number] Owner: Gretta Lauraine HERO, RN Status: Unresolved Open  Priority: High Created on: 10/22/2024 04:57 PM By: Vannie Alfonso HERO   Primary Information  Source  Kelly Perez (Patient)   Subject  Kelly Perez (Patient)   Topic  Clinical - Lab/Test Results    Communication  Reason for CRM: pt received message from nurse regarding zio monitor results and wants to ensure its safe to travel tomorrow or what it means . Please advise    Reason for Disposition  [1] Follow-up call from patient regarding patient's clinical status AND [2] information NON-URGENT  Answer Assessment - Initial Assessment Questions 1. REASON FOR CALL or QUESTION: What is your reason for calling today? or How can I best      Saw PCP on 12/3 for tachycardia. Got a Zio patch. PCP sent mychart message to pt today regarding Zio patch results. Wanting to know what the results mean. PCP prescribed metoprolol. Pt picked this up today. Also had a cardiology referral placed to [facility] by PCP, wondering if she needs this since she is already established with Dr. Jeffrie.   Pt wanting to know if it's safe to fly. Flying to [place], and then [place] and [place] on January 1st. Wont be home until January 11th. Flight leaves at 10 am tomorrow and boards at 9:30 am.  No CP, SOB, dizziness. Garmin watch has alerted pt HR has gotten up to 135 at the highest when exercising. Reports she feels fine.  2. CALLER: Document the source of call. (e.g., laboratory staff, caregiver or patient).     Pt  Protocols used: PCP Call - No Triage-A-AH

## 2024-10-22 NOTE — Progress Notes (Signed)
 "  Office: [phone]  /  Fax: [phone]  WEIGHT SUMMARY AND BIOMETRICS  Anthropometric Measurements Height: 5' 5.5 (1.664 m) Weight: (!) 309 lb (140.2 kg) BMI (Calculated): 50.62 Weight at Last Visit: 315lb Weight Lost Since Last Visit: 6lb Weight Gained Since Last Visit: 0lb Starting Weight: 372lb Total Weight Loss (lbs): 63 lb (28.6 kg) Peak Weight: 372lb   Body Composition  Body Fat %: 56.6 % Fat Mass (lbs): 175 lbs Muscle Mass (lbs): 127.4 lbs Total Body Water (lbs): 96 lbs Visceral Fat Rating : 22   Other Clinical Data Fasting: No Labs: No Today's Visit #: 46 Starting Date: 06/17/20    Chief Complaint: OBESITY    History of Present Illness Kelly Perez is a 58 year old female with obesity who presents for obesity treatment and progress assessment.  She adheres to a low-carb eating plan approximately 85% of the time and exercises an average of 50 minutes daily, seven days a week. Over the last five weeks, she has lost six pounds, including during the Thanksgiving and Christmas holidays.  She has a history of vitamin D  deficiency and is currently taking ergocalciferol  50,000 IU weekly. She requests a refill for this medication.  She manages her type 2 diabetes through diet, exercise, and weight loss. Additionally, she is on Mounjaro  10 mg subcutaneously weekly and requests a refill for this medication.  She has celiac disease and avoids all gluten.  She experiences arthritis in both knees, impacting her ability to walk, especially in cold and rainy weather. She has been undergoing physical therapy at [facility] to improve her knee function and has been strength training and working on walking endurance.        PHYSICAL EXAM:  Blood pressure 127/80, pulse 73, temperature 98.9 F (37.2 C), height 5' 5.5 (1.664 m), weight (!) 309 lb (140.2 kg), last menstrual period 02/13/2018, SpO2 98%. Body mass index is 50.64 kg/m.  DIAGNOSTIC DATA  REVIEWED:  BMET    Component Value Date/Time   NA 135 09/06/2024 0749   K 4.9 09/06/2024 0749   CL 99 09/06/2024 0749   CO2 22 09/06/2024 0749   GLUCOSE 100 (H) 09/06/2024 0749   GLUCOSE 93 01/26/2018 1052   BUN 15 09/06/2024 0749   CREATININE 0.71 09/06/2024 0749   CALCIUM  9.5 09/06/2024 0749   GFRNONAA 100 09/29/2020 1058   GFRAA 115 09/29/2020 1058   Lab Results  Component Value Date   HGBA1C 5.9 (H) 09/06/2024   HGBA1C 6.4 (H) 06/17/2020   Lab Results  Component Value Date   INSULIN  20.6 09/06/2024   INSULIN  27.4 (H) 06/17/2020   Lab Results  Component Value Date   TSH 2.460 09/06/2024   CBC    Component Value Date/Time   WBC 8.8 09/06/2024 0749   WBC 8.7 01/11/2023 0924   RBC 4.75 09/06/2024 0749   RBC 4.95 01/11/2023 0924   HGB 13.8 09/06/2024 0749   HCT 42.6 09/06/2024 0749   PLT 495 (H) 09/06/2024 0749   MCV 90 09/06/2024 0749   MCH 29.1 09/06/2024 0749   MCHC 32.4 09/06/2024 0749   MCHC 33.3 01/11/2023 0924   RDW 12.9 09/06/2024 0749   Iron Studies    Component Value Date/Time   IRON 82 01/26/2018 1052   TIBC 391 01/26/2018 1052   FERRITIN 35 01/26/2018 1052   IRONPCTSAT 21 01/26/2018 1052   Lipid Panel     Component Value Date/Time   CHOL 128 09/06/2024 0749   CHOL 239 (H) 04/14/2021  1524   TRIG 92 09/06/2024 0749   TRIG 157 (H) 04/14/2021 1524   HDL 49 09/06/2024 0749   CHOLHDL 5 01/11/2023 0924   VLDL 32.4 01/11/2023 0924   LDLCALC 61 09/06/2024 0749   Hepatic Function Panel     Component Value Date/Time   PROT 6.9 09/06/2024 0749   ALBUMIN 4.3 09/06/2024 0749   AST 14 09/06/2024 0749   ALT 17 09/06/2024 0749   ALKPHOS 101 09/06/2024 0749   BILITOT 0.4 09/06/2024 0749      Component Value Date/Time   TSH 2.460 09/06/2024 0749   Nutritional Lab Results  Component Value Date   VD25OH 84.0 09/06/2024   VD25OH 75.6 04/02/2024   VD25OH 60.6 09/29/2023     Assessment and Plan Assessment & Plan Obesity Management is  ongoing with a low-carb diet and regular exercise. She has lost 6 pounds in the last five weeks, including the Thanksgiving and Christmas holidays, indicating significant progress. She exercises an average of 50 minutes, seven days a week. - Continue low-carb diet and regular exercise regimen  Type 2 diabetes mellitus Managed with diet, exercise, and weight loss. She is also on Mounjaro  10 mg subcutaneously weekly. No issues reported with Mounjaro . - Continue Mounjaro  10 mg subcutaneously weekly - Refilled Mounjaro  prescription - Continue diet, exercise and weight loss as discussed today as an important part of the treatment plan   Vitamin D  deficiency Managed with prescription ergocalciferol  50,000 IU weekly. She requests a refill. - Continue ergocalciferol  50,000 IU weekly - Refilled ergocalciferol  prescription  Celiac disease Well-controlled with a strict gluten-free diet. - Continue strict gluten-free diet  Bilateral knee osteoarthritis Managed with physical therapy. She reports arthritis in both knees and is undergoing physical therapy at [facility] therapy. She is also using an Ally device for additional support. - Continue physical therapy for knee osteoarthritis - Continue using Ally device for knee support - Continue diet, exercise and weight loss as discussed today as an important part of the treatment plan       Patients who are on anti-obesity medications are counseled on the importance of maintaining healthy lifestyle habits, including balanced nutrition, regular physical activity, and behavioral modifications,  Medication is an adjunct to, not a replacement for, lifestyle changes and that the long-term success and weight maintenance depend on continued adherence to these strategies.   Ambert was informed of the importance of frequent follow up visits to maximize her success with intensive lifestyle modifications for her obesity and obesity related health conditions as  recommended by USPSTF and CMS guidelines   Louann Penton, MD   "

## 2024-10-23 NOTE — Telephone Encounter (Signed)
 See MyChart encounter

## 2024-10-23 NOTE — Telephone Encounter (Signed)
 See Mychart encounter.

## 2024-11-05 ENCOUNTER — Encounter: Payer: Self-pay | Admitting: Cardiology

## 2024-11-05 ENCOUNTER — Ambulatory Visit: Attending: Cardiology | Admitting: Cardiology

## 2024-11-05 VITALS — BP 116/68 | HR 86 | Ht 66.0 in | Wt 314.4 lb

## 2024-11-05 DIAGNOSIS — R0602 Shortness of breath: Secondary | ICD-10-CM | POA: Diagnosis not present

## 2024-11-05 DIAGNOSIS — E785 Hyperlipidemia, unspecified: Secondary | ICD-10-CM | POA: Diagnosis not present

## 2024-11-05 DIAGNOSIS — I10 Essential (primary) hypertension: Secondary | ICD-10-CM

## 2024-11-05 DIAGNOSIS — R000 Tachycardia, unspecified: Secondary | ICD-10-CM | POA: Diagnosis not present
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
## 2024-11-05 NOTE — Patient Instructions (Signed)
 Medication Instructions:  The current medical regimen is effective;  continue present plan and medications.  *If you need a refill on your cardiac medications before your next appointment, please call your pharmacy*  Testing/Procedures: Your physician has requested that you have an echocardiogram. Echocardiography is a painless test that uses sound waves to create images of your heart. It provides your doctor with information about the size and shape of your heart and how well your heart's chambers and valves are working. This procedure takes approximately one hour. There are no restrictions for this procedure. Please do NOT wear cologne, perfume, aftershave, or lotions (deodorant is allowed). Please arrive 15 minutes prior to your appointment time.  Please note: We ask at that you not bring children with you during ultrasound (echo/ vascular) testing. Due to room size and safety concerns, children are not allowed in the ultrasound rooms during exams. Our front office staff cannot provide observation of children in our lobby area while testing is being conducted. An adult accompanying a patient to their appointment will only be allowed in the ultrasound room at the discretion of the ultrasound technician under special circumstances. We apologize for any inconvenience.   Follow-Up: At [facility], you and your health needs are our priority.  As part of our continuing mission to provide you with exceptional heart care, our providers are all part of one team.  This team includes your primary Cardiologist (physician) and Advanced Practice Providers or APPs (Physician Assistants and Nurse Practitioners) who all work together to provide you with the care you need, when you need it.  Your next appointment:   1 year(s)  Provider:   Dr Donato Schultz     We recommend signing up for the patient portal called "MyChart".  Sign up information is provided on this After Visit Summary.  MyChart is used  to connect with patients for Virtual Visits (Telemedicine).  Patients are able to view lab/test results, encounter notes, upcoming appointments, etc.  Non-urgent messages can be sent to your provider as well.   To learn more about what you can do with MyChart, go to [email or web address].

## 2024-11-19 ENCOUNTER — Ambulatory Visit (INDEPENDENT_AMBULATORY_CARE_PROVIDER_SITE_OTHER): Admitting: Family Medicine

## 2024-11-22 ENCOUNTER — Telehealth (INDEPENDENT_AMBULATORY_CARE_PROVIDER_SITE_OTHER): Admitting: Family Medicine

## 2024-11-22 ENCOUNTER — Encounter (INDEPENDENT_AMBULATORY_CARE_PROVIDER_SITE_OTHER): Payer: Self-pay | Admitting: Family Medicine

## 2024-11-22 VITALS — BP 116/75 | HR 91 | Ht 66.0 in | Wt 311.0 lb

## 2024-11-22 DIAGNOSIS — E1169 Type 2 diabetes mellitus with other specified complication: Secondary | ICD-10-CM

## 2024-11-22 DIAGNOSIS — E119 Type 2 diabetes mellitus without complications: Secondary | ICD-10-CM | POA: Diagnosis not present

## 2024-11-22 DIAGNOSIS — Z6841 Body Mass Index (BMI) 40.0 and over, adult: Secondary | ICD-10-CM | POA: Diagnosis not present

## 2024-11-22 DIAGNOSIS — Z7985 Long-term (current) use of injectable non-insulin antidiabetic drugs: Secondary | ICD-10-CM | POA: Diagnosis not present

## 2024-11-22 DIAGNOSIS — I10 Essential (primary) hypertension: Secondary | ICD-10-CM | POA: Diagnosis not present

## 2024-11-22 DIAGNOSIS — E669 Obesity, unspecified: Secondary | ICD-10-CM | POA: Diagnosis not present

## 2024-11-22 NOTE — Addendum Note (Signed)
 Addended by: LAFE BAKER CROME on: 11/22/2024 10:40 AM   Modules accepted: Level of Service

## 2024-11-22 NOTE — Addendum Note (Signed)
 Addended by: LAFE BAKER CROME on: 11/22/2024 10:09 AM   Modules accepted: Level of Service
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
## 2024-12-10 ENCOUNTER — Ambulatory Visit (HOSPITAL_COMMUNITY)

## 2024-12-17 ENCOUNTER — Ambulatory Visit (INDEPENDENT_AMBULATORY_CARE_PROVIDER_SITE_OTHER): Admitting: Family Medicine

## 2024-12-21 ENCOUNTER — Ambulatory Visit: Admitting: Family Medicine
